# Patient Record
Sex: Female | Born: 1965 | Race: Black or African American | Hispanic: No | Smoking: Former smoker
Health system: Southern US, Community
[De-identification: ages and names within clinical notes are randomized; demographics above are authoritative.]

## PROBLEM LIST (undated history)

## (undated) DIAGNOSIS — C50912 Malignant neoplasm of unspecified site of left female breast: Secondary | ICD-10-CM

## (undated) HISTORY — PX: ABLATION: SHX5711

## (undated) HISTORY — DX: Malignant neoplasm of unspecified site of left female breast: C50.912

## (undated) HISTORY — PX: TUBAL LIGATION: SHX77

## (undated) MED FILL — Trastuzumab-anns For IV Soln 150 MG: INTRAVENOUS | Qty: 29 | Status: AC

---

## 2014-05-01 DIAGNOSIS — Z0000 Encounter for general adult medical examination without abnormal findings: Secondary | ICD-10-CM | POA: Insufficient documentation

## 2014-05-01 DIAGNOSIS — E0590 Thyrotoxicosis, unspecified without thyrotoxic crisis or storm: Secondary | ICD-10-CM | POA: Insufficient documentation

## 2015-10-17 DIAGNOSIS — M545 Low back pain, unspecified: Secondary | ICD-10-CM | POA: Insufficient documentation

## 2015-11-14 DIAGNOSIS — M5430 Sciatica, unspecified side: Secondary | ICD-10-CM | POA: Insufficient documentation

## 2016-10-18 DIAGNOSIS — R079 Chest pain, unspecified: Secondary | ICD-10-CM

## 2016-10-18 DIAGNOSIS — I10 Essential (primary) hypertension: Secondary | ICD-10-CM

## 2016-10-18 HISTORY — DX: Chest pain, unspecified: R07.9

## 2016-10-18 HISTORY — DX: Essential (primary) hypertension: I10

## 2016-10-19 DIAGNOSIS — F17200 Nicotine dependence, unspecified, uncomplicated: Secondary | ICD-10-CM

## 2016-10-19 HISTORY — DX: Nicotine dependence, unspecified, uncomplicated: F17.200

## 2019-06-12 DIAGNOSIS — I361 Nonrheumatic tricuspid (valve) insufficiency: Secondary | ICD-10-CM | POA: Diagnosis not present

## 2019-06-14 ENCOUNTER — Telehealth: Payer: Self-pay

## 2019-06-14 NOTE — Telephone Encounter (Signed)
NOTES ON FILE FROM [place], SENT REFERRAL TO SCHEDULING

## 2019-06-25 HISTORY — DX: Morbid (severe) obesity due to excess calories: E66.01

## 2019-06-26 ENCOUNTER — Ambulatory Visit (INDEPENDENT_AMBULATORY_CARE_PROVIDER_SITE_OTHER): Payer: BC Managed Care – PPO | Admitting: Cardiology

## 2019-06-26 ENCOUNTER — Encounter: Payer: Self-pay | Admitting: Cardiology

## 2019-06-26 ENCOUNTER — Other Ambulatory Visit: Payer: Self-pay

## 2019-06-26 VITALS — BP 134/86 | HR 71 | Ht 63.0 in | Wt 221.0 lb

## 2019-06-26 DIAGNOSIS — F17200 Nicotine dependence, unspecified, uncomplicated: Secondary | ICD-10-CM

## 2019-06-26 DIAGNOSIS — I429 Cardiomyopathy, unspecified: Secondary | ICD-10-CM

## 2019-06-26 DIAGNOSIS — R079 Chest pain, unspecified: Secondary | ICD-10-CM | POA: Diagnosis not present

## 2019-06-26 DIAGNOSIS — I10 Essential (primary) hypertension: Secondary | ICD-10-CM | POA: Diagnosis not present

## 2019-06-26 DIAGNOSIS — I428 Other cardiomyopathies: Secondary | ICD-10-CM

## 2019-06-26 HISTORY — DX: Cardiomyopathy, unspecified: I42.9

## 2019-06-26 MED ORDER — SACUBITRIL-VALSARTAN 24-26 MG PO TABS: 1.0000 | ORAL_TABLET | Freq: Two times a day (BID) | ORAL | 3 refills | Status: DC

## 2019-06-26 NOTE — Progress Notes (Signed)
Cardiology Office Note:    Date:  06/26/2019   ID:  Brandi Weaver, DOB June 07, 1965, MRN [id number]  PCP:  Wyline Copas Hewitt Shorts, PA-C  Cardiologist:  Jenean Lindau, MD   Referring MD: Derwood Kaplan, MD    ASSESSMENT:    1. Chest pain on exertion   2. Essential hypertension   3. Morbid (severe) obesity due to excess calories ([place])   4. Tobacco use disorder   5. Other cardiomyopathy ([place])    PLAN:    In order of problems listed above:  1. Primary prevention stressed with the patient.  Importance of compliance with diet and medication stressed and she vocalized understanding. 2. Cardiomyopathy: This is a new finding.  This was confirmed by [name] nuclear testing also.  At this time I will initiate her on Entresto.  We will do a Chem-7 today.  She has history of hypothyroidism and I would like to get her copy of her lab work including TSH to see if she is euthyroid.  It is possible that some of this may have contributed to her cardiomyopathy. 3. She has some chest tightness and risk factors also for coronary artery disease so we will do a [name]. 4. Cigarette smoker: She quit a couple of days ago and smoking was counseled.  I spent 5 minutes with the patient discussing solely about smoking. Smoking cessation was counseled. I suggested to the patient also different medications and pharmacological interventions. Patient is keen to try stopping on its own at this time. He will get back to me if he needs any further assistance in this matter. 5. Obesity: Weight reduction was stressed risks of obesity explained and she vocalized understanding 6. Breast cancer on chemotherapy: Followed by Dr. Hinton Rao.  Further recommendations will be made based on the findings of the a forementioned test and the patient will be seen in follow-up appointment in 3 to 4 weeks from now.   Medication Adjustments/Labs and Tests Ordered: Current medicines are reviewed at length with the patient  today.  Concerns regarding medicines are outlined above.  No orders of the defined types were placed in this encounter.  No orders of the defined types were placed in this encounter.    History of Present Illness:    Brandi Weaver is a 54 y.o. female who is being seen today for the evaluation of cardiomyopathy at the request of Derwood Kaplan, MD.  Patient has recently been diagnosed to have breast cancer and receiving chemotherapy.  She was found to have an ejection fraction which was mildly depressed by echocardiogram.  She gives history of heart attack in the remote past.  She tells me that this was stress related.  No details are available.  She underwent coronary angiography according to her history and she was told that her arteries were clean.  Subsequently she has not had any follow-up or echocardiogram sweats heart disease but there is finding of a mildly reduced ejection fraction is new or whether it has been swelling in the past.  She also has history of significant I spent 5 minutes with the patient discussing solely about smoking. Smoking cessation was counseled. I suggested to the patient also different medications and pharmacological interventions. Patient is keen to try stopping on its own at this time. He will get back to me if he needs any further assistance in this matter..  At the time of my evaluation, the patient is alert awake oriented and in no distress.  Past Medical History:  Diagnosis Date  . Chest pain on exertion 10/18/2016  . Hypertension 10/18/2016  . Malignant neoplasm of left breast ([place])   . Morbid (severe) obesity due to excess calories ([place]) 06/25/2019   Formatting of this note might be different from the original. per last BMI of 41.64 on 02/13/2018  . Tobacco use disorder 10/19/2016    Past Surgical History:  Procedure Laterality Date  . ABLATION    . TUBAL LIGATION      Current Medications: Current Meds  Medication Sig  . dexamethasone  (DECADRON) 4 MG tablet Take 4 mg by mouth as directed.  Marland Kitchen HYDROcodone-acetaminophen (NORCO/VICODIN) 5-325 MG tablet Take 1 tablet by mouth every 4 (four) hours.  Marland Kitchen loratadine (CLARITIN) 10 MG tablet Take 10 mg by mouth daily.  . methimazole (TAPAZOLE) 10 MG tablet Take 10 mg by mouth 3 (three) times daily.  Marland Kitchen nystatin (MYCOSTATIN/NYSTOP) powder Apply 1 application topically 2 (two) times daily.  . ondansetron (ZOFRAN) 4 MG tablet Take 4 mg by mouth every 4 (four) hours as needed.  . prochlorperazine (COMPAZINE) 10 MG tablet Take 10 mg by mouth every 6 (six) hours as needed.  . varenicline (CHANTIX) 1 MG tablet Take 1 mg by mouth 2 (two) times daily.  . [DISCONTINUED] atenolol (TENORMIN) 25 MG tablet Take 25 mg by mouth daily.     Allergies:   Sulfa antibiotics   Social History   Socioeconomic History  . Marital status: Widowed    Spouse name: Not on file  . Number of children: Not on file  . Years of education: Not on file  . Highest education level: Not on file  Occupational History  . Not on file  Tobacco Use  . Smoking status: Former Smoker    Types: Cigarettes    Quit date: 06/05/2019    Years since quitting: 0.0  . Smokeless tobacco: Never Used  Substance and Sexual Activity  . Alcohol use: Not Currently  . Drug use: Never  . Sexual activity: Not on file  Other Topics Concern  . Not on file  Social History Narrative  . Not on file   Social Determinants of Health   Financial Resource Strain:   . Difficulty of Paying Living Expenses:   Food Insecurity:   . Worried About Charity fundraiser in the Last Year:   . Arboriculturist in the Last Year:   Transportation Needs:   . Film/video editor (Medical):   Marland Kitchen Lack of Transportation (Non-Medical):   Physical Activity:   . Days of Exercise per Week:   . Minutes of Exercise per Session:   Stress:   . Feeling of Stress :   Social Connections:   . Frequency of Communication with Friends and Family:   . Frequency of  Social Gatherings with Friends and Family:   . Attends Religious Services:   . Active Member of Clubs or Organizations:   . Attends Archivist Meetings:   Marland Kitchen Marital Status:      Family History: The patient's family history includes Heart disease in her father; Hypertension in her father; Stroke in her father; Throat cancer in her mother.  ROS:   Please see the history of present illness.    All other systems reviewed and are negative.  EKGs/Labs/Other Studies Reviewed:    The following studies were reviewed today: EKG reveals sinus rhythm and nonspecific ST-T changes   Recent Labs: No results found for requested labs within  last 8760 hours.  Recent Lipid Panel No results found for: CHOL, TRIG, HDL, CHOLHDL, VLDL, LDLCALC, LDLDIRECT  Physical Exam:    VS:  BP 134/86   Pulse 71   Ht 5\' 3"  (1.6 m)   Wt 221 lb (100.2 kg)   SpO2 98%   BMI 39.15 kg/m     Wt Readings from Last 3 Encounters:  06/26/19 221 lb (100.2 kg)     GEN: Patient is in no acute distress HEENT: Normal NECK: No JVD; No carotid bruits LYMPHATICS: No lymphadenopathy CARDIAC: S1 S2 regular, 2/6 systolic murmur at the apex. RESPIRATORY:  Clear to auscultation without rales, wheezing or rhonchi  ABDOMEN: Soft, non-tender, non-distended MUSCULOSKELETAL:  No edema; No deformity  SKIN: Warm and dry NEUROLOGIC:  Alert and oriented x 3 PSYCHIATRIC:  Normal affect    Signed, Jenean Lindau, MD  06/26/2019 2:10 PM    [facility]

## 2019-06-26 NOTE — Patient Instructions (Addendum)
Medication Instructions:  Your physician has recommended you make the following change in your medication:   Stop Atenolol. Start Entresto 24-26 mg twice daily.  *If you need a refill on your cardiac medications before your next appointment, please call your pharmacy*   Lab Work: Your physician recommends that you had a BMET and TSH drawn today.  If you have labs (blood work) drawn today and your tests are completely normal, you will receive your results only by: Marland Kitchen MyChart Message (if you have MyChart) OR . A paper copy in the mail If you have any lab test that is abnormal or we need to change your treatment, we will call you to review the results.   Testing/Procedures: Your physician has requested that you have a lexiscan myoview. For further information please visit [email or web address]. Please follow instruction sheet, as given.  The test will take approximately 3 to 4 hours to complete; you may bring reading material.  If someone comes with you to your appointment, they will need to remain in the main lobby due to limited space in the testing area. **If you are pregnant or breastfeeding, please notify the nuclear lab prior to your appointment**  How to prepare for your Myocardial Perfusion Test: . Do not eat or drink 3 hours prior to your test, except you may have water. . Do not consume products containing caffeine (regular or decaffeinated) 12 hours prior to your test. (ex: coffee, chocolate, sodas, tea). . Do bring a list of your current medications with you.  If not listed below, you may take your medications as normal. . Do wear comfortable clothes (no dresses or overalls) and walking shoes, tennis shoes preferred (No heels or open toe shoes are allowed). . Do NOT wear cologne, perfume, aftershave, or lotions (deodorant is allowed). . If these instructions are not followed, your test will have to be rescheduled.    Follow-Up: At [facility], you and your health needs are  our priority.  As part of our continuing mission to provide you with exceptional heart care, we have created designated Provider Care Teams.  These Care Teams include your primary Cardiologist (physician) and Advanced Practice Providers (APPs -  Physician Assistants and Nurse Practitioners) who all work together to provide you with the care you need, when you need it.  We recommend signing up for the patient portal called "MyChart".  Sign up information is provided on this After Visit Summary.  MyChart is used to connect with patients for Virtual Visits (Telemedicine).  Patients are able to view lab/test results, encounter notes, upcoming appointments, etc.  Non-urgent messages can be sent to your provider as well.   To learn more about what you can do with MyChart, go to [email or web address].    Your next appointment:   4 week(s)  The format for your next appointment:   In Person  Provider:   Jyl Heinz, MD   Other Instructions NA

## 2019-06-27 ENCOUNTER — Telehealth: Payer: Self-pay

## 2019-06-27 LAB — BASIC METABOLIC PANEL
BUN/Creatinine Ratio: 22 (ref 9–23)
BUN: 20 mg/dL (ref 6–24)
CO2: 21 mmol/L (ref 20–29)
Calcium: 9.8 mg/dL (ref 8.7–10.2)
Chloride: 98 mmol/L (ref 96–106)
Creatinine, Ser: 0.9 mg/dL (ref 0.57–1.00)
GFR calc Af Amer: 83 mL/min/{1.73_m2} (ref >59)
GFR calc non Af Amer: 72 mL/min/{1.73_m2} (ref >59)
Glucose: 82 mg/dL (ref 65–99)
Potassium: 4.3 mmol/L (ref 3.5–5.2)
Sodium: 136 mmol/L (ref 134–144)

## 2019-06-27 LAB — TSH: TSH: 1.83 u[IU]/mL (ref 0.450–4.500)

## 2019-06-27 NOTE — Telephone Encounter (Signed)
Spoke with patient regarding results and recommendation.  Patient verbalizes understanding and is agreeable to plan of care. Advised patient to call back with any issues or concerns.  

## 2019-06-27 NOTE — Telephone Encounter (Signed)
-----   Message from Jenean Lindau, MD sent at 06/27/2019  8:07 AM EDT ----- The results of the study is unremarkable. Please inform patient. I will discuss in detail at next appointment. Cc  primary care/referring physician Jenean Lindau, MD 06/27/2019 8:07 AM

## 2019-07-03 ENCOUNTER — Encounter: Payer: Self-pay | Admitting: *Deleted

## 2019-07-03 ENCOUNTER — Telehealth: Payer: Self-pay | Admitting: *Deleted

## 2019-07-03 NOTE — Telephone Encounter (Signed)
Patient given detailed instructions per Myocardial Perfusion Study Information Sheet for the test on 07/10/2019 at 0815. Patient notified to arrive 15 minutes early and that it is imperative to arrive on time for appointment to keep from having the test rescheduled.  If you need to cancel or reschedule your appointment, please call the office within 24 hours of your appointment. . Patient verbalized understanding.[facility] Mychart letter sent with instructions

## 2019-07-10 ENCOUNTER — Other Ambulatory Visit: Payer: Self-pay

## 2019-07-10 ENCOUNTER — Ambulatory Visit (INDEPENDENT_AMBULATORY_CARE_PROVIDER_SITE_OTHER): Payer: BC Managed Care – PPO

## 2019-07-10 VITALS — Ht 63.0 in | Wt 221.0 lb

## 2019-07-10 DIAGNOSIS — R079 Chest pain, unspecified: Secondary | ICD-10-CM

## 2019-07-10 DIAGNOSIS — I428 Other cardiomyopathies: Secondary | ICD-10-CM | POA: Diagnosis not present

## 2019-07-10 LAB — MYOCARDIAL PERFUSION IMAGING
LV dias vol: 134 mL (ref 46–106)
LV sys vol: 82 mL
Peak HR: 93 {beats}/min
Rest HR: 64 {beats}/min
SDS: 2
SRS: 4
SSS: 6
TID: 1.03

## 2019-07-10 MED ORDER — REGADENOSON 0.4 MG/5ML IV SOLN
0.4000 mg | Freq: Once | INTRAVENOUS | Status: AC
  Administered 2019-07-10: 0.4 mg via INTRAVENOUS

## 2019-07-10 MED ORDER — TECHNETIUM TC 99M TETROFOSMIN IV KIT
10.1000 | PACK | Freq: Once | INTRAVENOUS | Status: AC | PRN
Start: 2019-07-10 — End: 2019-07-10
  Administered 2019-07-10: 10.1 via INTRAVENOUS

## 2019-07-10 MED ORDER — TECHNETIUM TC 99M TETROFOSMIN IV KIT
29.9000 | PACK | Freq: Once | INTRAVENOUS | Status: AC | PRN
  Administered 2019-07-10: 29.9 via INTRAVENOUS

## 2019-07-17 ENCOUNTER — Other Ambulatory Visit: Payer: Self-pay

## 2019-07-17 ENCOUNTER — Encounter: Payer: Self-pay | Admitting: Cardiology

## 2019-07-17 ENCOUNTER — Ambulatory Visit (INDEPENDENT_AMBULATORY_CARE_PROVIDER_SITE_OTHER): Payer: BC Managed Care – PPO | Admitting: Cardiology

## 2019-07-17 VITALS — BP 118/78 | HR 108 | Ht 63.0 in | Wt 224.0 lb

## 2019-07-17 DIAGNOSIS — I428 Other cardiomyopathies: Secondary | ICD-10-CM

## 2019-07-17 DIAGNOSIS — R9439 Abnormal result of other cardiovascular function study: Secondary | ICD-10-CM

## 2019-07-17 DIAGNOSIS — I10 Essential (primary) hypertension: Secondary | ICD-10-CM | POA: Diagnosis not present

## 2019-07-17 DIAGNOSIS — F17200 Nicotine dependence, unspecified, uncomplicated: Secondary | ICD-10-CM

## 2019-07-17 MED ORDER — METOPROLOL TARTRATE 100 MG PO TABS
100.0000 mg | ORAL_TABLET | Freq: Once | ORAL | 0 refills | Status: DC
Start: 2019-07-17 — End: 2019-09-06

## 2019-07-17 NOTE — Progress Notes (Signed)
Cardiology Office Note:    Date:  07/17/2019   ID:  Brandi Weaver, DOB 1965/12/02, MRN [id number]  PCP:  Wyline Copas Hewitt Shorts, PA-C  Cardiologist:  Jenean Lindau, MD   Referring MD: Wyline Copas Hewitt Shorts, PA-C    ASSESSMENT:    No diagnosis found. PLAN:    In order of problems listed above:  1. Abnormal stress nuclear Cardiolite: I discussed my findings with the patient at extensive length.  She leads a sedentary life and has no chest pain however if she wants this evaluated further.  I discussed invasive and noninvasive evaluation and she prefers CT coronary angiography and I discussed this with her at length.  She is agreeable.  We will schedule her for this.  In the interim she will take a coated aspirin on a daily basis if okay with her cancer doctors. 2. Essential hypertension: Blood pressure is stable 3. Cigarette smoker: I counseled her to quit and she is promising to do better. 4. Patient will be seen in follow-up appointment in 3 months or earlier if the patient has any concerns 5. She knows to go to nearest emergency room for any concerning symptoms.   Medication Adjustments/Labs and Tests Ordered: Current medicines are reviewed at length with the patient today.  Concerns regarding medicines are outlined above.  No orders of the defined types were placed in this encounter.  No orders of the defined types were placed in this encounter.    Chief Complaint  Patient presents with  . Follow-up    3-4 WK FU      History of Present Illness:    Brandi Weaver is a 54 y.o. female.  Patient has past medical history of coronary artery disease.  Details are not clear.  She is receiving treatment for cancer.  No chest pain orthopnea or PND.  Her stress test is abnormal.  There is no evidence of ischemia but is wall motion abnormalities and reduced ejection fraction.  She is here for follow-up.  Unfortunately she is continuing to smoke.  Past Medical History:  Diagnosis Date   . Chest pain on exertion 10/18/2016  . Hypertension 10/18/2016  . Malignant neoplasm of left breast ([place])   . Morbid (severe) obesity due to excess calories ([place]) 06/25/2019   Formatting of this note might be different from the original. per last BMI of 41.64 on 02/13/2018  . Tobacco use disorder 10/19/2016    Past Surgical History:  Procedure Laterality Date  . ABLATION    . TUBAL LIGATION      Current Medications: Current Meds  Medication Sig  . amLODipine (NORVASC) 5 MG tablet Take 5 mg by mouth daily.  Marland Kitchen dexamethasone (DECADRON) 4 MG tablet Take 4 mg by mouth as directed.  Marland Kitchen HYDROcodone-acetaminophen (NORCO/VICODIN) 5-325 MG tablet Take 1 tablet by mouth every 4 (four) hours.  Marland Kitchen loratadine (CLARITIN) 10 MG tablet Take 10 mg by mouth daily.  . methimazole (TAPAZOLE) 10 MG tablet Take 10 mg by mouth 3 (three) times daily.  Marland Kitchen nystatin (MYCOSTATIN/NYSTOP) powder Apply 1 application topically 2 (two) times daily.  . ondansetron (ZOFRAN) 4 MG tablet Take 4 mg by mouth every 4 (four) hours as needed.  . prochlorperazine (COMPAZINE) 10 MG tablet Take 10 mg by mouth every 6 (six) hours as needed.  . sacubitril-valsartan (ENTRESTO) 24-26 MG Take 1 tablet by mouth 2 (two) times daily.  . varenicline (CHANTIX) 1 MG tablet Take 1 mg by mouth 2 (two) times daily.  Allergies:   Sulfa antibiotics   Social History   Socioeconomic History  . Marital status: Widowed    Spouse name: Not on file  . Number of children: Not on file  . Years of education: Not on file  . Highest education level: Not on file  Occupational History  . Not on file  Tobacco Use  . Smoking status: Former Smoker    Types: Cigarettes    Quit date: 06/05/2019    Years since quitting: 0.1  . Smokeless tobacco: Never Used  Substance and Sexual Activity  . Alcohol use: Not Currently  . Drug use: Never  . Sexual activity: Not on file  Other Topics Concern  . Not on file  Social History Narrative  . Not on file    Social Determinants of Health   Financial Resource Strain:   . Difficulty of Paying Living Expenses:   Food Insecurity:   . Worried About Charity fundraiser in the Last Year:   . Arboriculturist in the Last Year:   Transportation Needs:   . Film/video editor (Medical):   Marland Kitchen Lack of Transportation (Non-Medical):   Physical Activity:   . Days of Exercise per Week:   . Minutes of Exercise per Session:   Stress:   . Feeling of Stress :   Social Connections:   . Frequency of Communication with Friends and Family:   . Frequency of Social Gatherings with Friends and Family:   . Attends Religious Services:   . Active Member of Clubs or Organizations:   . Attends Archivist Meetings:   Marland Kitchen Marital Status:      Family History: The patient's family history includes Heart disease in her father; Hypertension in her father; Stroke in her father; Throat cancer in her mother.  ROS:   Please see the history of present illness.    All other systems reviewed and are negative.  EKGs/Labs/Other Studies Reviewed:    The following studies were reviewed today: Study Highlights   The left ventricular ejection fraction is moderately decreased (30-44%).  Nuclear stress EF: 39%.  There was no ST segment deviation noted during stress.  Defect 1: There is a medium defect of moderate severity.  Findings consistent with prior myocardial infarction.  This is an intermediate risk study.  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  Diminished EF.        Recent Labs: 06/26/2019: BUN 20; Creatinine, Ser 0.90; Potassium 4.3; Sodium 136; TSH 1.830  Recent Lipid Panel No results found for: CHOL, TRIG, HDL, CHOLHDL, VLDL, LDLCALC, LDLDIRECT  Physical Exam:    VS:  BP 118/78   Pulse (!) 108   Ht 5\' 3"  (1.6 m)   Wt 224 lb (101.6 kg)   SpO2 96%   BMI 39.68 kg/m     Wt Readings from Last 3 Encounters:  07/17/19 224 lb (101.6 kg)  07/10/19 221 lb (100.2 kg)  06/26/19 221 lb  (100.2 kg)     GEN: Patient is in no acute distress HEENT: Normal NECK: No JVD; No carotid bruits LYMPHATICS: No lymphadenopathy CARDIAC: Hear sounds regular, 2/6 systolic murmur at the apex. RESPIRATORY:  Clear to auscultation without rales, wheezing or rhonchi  ABDOMEN: Soft, non-tender, non-distended MUSCULOSKELETAL:  No edema; No deformity  SKIN: Warm and dry NEUROLOGIC:  Alert and oriented x 3 PSYCHIATRIC:  Normal affect   Signed, Jenean Lindau, MD  07/17/2019 3:44 PM    [facility]

## 2019-07-17 NOTE — Patient Instructions (Signed)
Medication Instructions:  Your physician has recommended you make the following change in your medication:   Start taking 81 mg coated baby aspirin daily.  *If you need a refill on your cardiac medications before your next appointment, please call your pharmacy*   Lab Work: Your physician recommends that you return for lab work in: if your CT is after 07/25/19 you need to have a BMET done.   If you have labs (blood work) drawn today and your tests are completely normal, you will receive your results only by: Marland Kitchen MyChart Message (if you have MyChart) OR . A paper copy in the mail If you have any lab test that is abnormal or we need to change your treatment, we will call you to review the results.   Testing/Procedures: Your cardiac CT will be scheduled at:   [facility] [address] [place], [place] [address] [phone]  [facility], please arrive at the [facility] main entrance of [facility] 30 minutes prior to test start time. Proceed to the [facility] Radiology Department (first floor) to check-in and test prep.  Please follow these instructions carefully (unless otherwise directed):   On the Night Before the Test: . Be sure to Drink plenty of water. . Do not consume any caffeinated/decaffeinated beverages or chocolate 12 hours prior to your test. . Do not take any antihistamines 12 hours prior to your test.  On the Day of the Test: . Drink plenty of water. Do not drink any water within one hour of the test. . Do not eat any food 4 hours prior to the test. . You may take your regular medications prior to the test.  . Take metoprolol (Lopressor) two hours prior to test. . FEMALES- please wear underwire-free bra if available       After the Test: . Drink plenty of water. . After receiving IV contrast, you may experience a mild flushed feeling. This is normal. . On occasion, you may experience a mild rash up to 24 hours after the test.  This is not dangerous. If this occurs, you can take Benadryl 25 mg and increase your fluid intake. . If you experience trouble breathing, this can be serious. If it is severe call 911 IMMEDIATELY. If it is mild, please call our office. . If you take any of these medications: Glipizide/Metformin, Avandament, Glucavance, please do not take 48 hours after completing test unless otherwise instructed.   Once we have confirmed authorization from your insurance company, we will call you to set up a date and time for your test.   For non-scheduling related questions, please contact the cardiac imaging nurse navigator should you have any questions/concerns: Marchia Bond, Cardiac Imaging Nurse Navigator Burley Saver, Interim Cardiac Imaging Nurse [place] [facility] Direct Office Dial: [phone]   For scheduling needs, including cancellations and rescheduling, please call [phone].     Follow-Up: At [facility], you and your health needs are our priority.  As part of our continuing mission to provide you with exceptional heart care, we have created designated Provider Care Teams.  These Care Teams include your primary Cardiologist (physician) and Advanced Practice Providers (APPs -  Physician Assistants and Nurse Practitioners) who all work together to provide you with the care you need, when you need it.  We recommend signing up for the patient portal called "MyChart".  Sign up information is provided on this After Visit Summary.  MyChart is used to  connect with patients for Virtual Visits (Telemedicine).  Patients are able to view lab/test results, encounter notes, upcoming appointments, etc.  Non-urgent messages can be sent to your provider as well.   To learn more about what you can do with MyChart, go to [email or web address].    Your next appointment:   3 month(s)  The format for your next appointment:   In Person  Provider:   Jyl Heinz,  MD   Other Instructions  Cardiac CT Angiogram A cardiac CT angiogram is a procedure to look at the heart and the area around the heart. It may be done to help find the cause of chest pains or other symptoms of heart disease. During this procedure, a substance called contrast dye is injected into the blood vessels in the area to be checked. A large X-ray machine, called a CT scanner, then takes detailed pictures of the heart and the surrounding area. The procedure is also sometimes called a coronary CT angiogram, coronary artery scanning, or CTA. A cardiac CT angiogram allows the health care provider to see how well blood is flowing to and from the heart. The health care provider will be able to see if there are any problems, such as:  Blockage or narrowing of the coronary arteries in the heart.  Fluid around the heart.  Signs of weakness or disease in the muscles, valves, and tissues of the heart. Tell a health care provider about:  Any allergies you have. This is especially important if you have had a previous allergic reaction to contrast dye.  All medicines you are taking, including vitamins, herbs, eye drops, creams, and over-the-counter medicines.  Any blood disorders you have.  Any surgeries you have had.  Any medical conditions you have.  Whether you are pregnant or may be pregnant.  Any anxiety disorders, chronic pain, or other conditions you have that may increase your stress or prevent you from lying still. What are the risks? Generally, this is a safe procedure. However, problems may occur, including:  Bleeding.  Infection.  Allergic reactions to medicines or dyes.  Damage to other structures or organs.  Kidney damage from the contrast dye that is used.  Increased risk of cancer from radiation exposure. This risk is low. Talk with your health care provider about: ? The risks and benefits of testing. ? How you can receive the lowest dose of radiation. What  happens before the procedure?  Wear comfortable clothing and remove any jewelry, glasses, dentures, and hearing aids.  Follow instructions from your health care provider about eating and drinking. This may include: ? For 12 hours before the procedure -- avoid caffeine. This includes tea, coffee, soda, energy drinks, and diet pills. Drink plenty of water or other fluids that do not have caffeine in them. Being well hydrated can prevent complications. ? For 4-6 hours before the procedure -- stop eating and drinking. The contrast dye can cause nausea, but this is less likely if your stomach is empty.  Ask your health care provider about changing or stopping your regular medicines. This is especially important if you are taking diabetes medicines, blood thinners, or medicines to treat problems with erections (erectile dysfunction). What happens during the procedure?   Hair on your chest may need to be removed so that small sticky patches called electrodes can be placed on your chest. These will transmit information that helps to monitor your heart during the procedure.  An IV will be inserted into one of your veins.  You might be given a medicine to control your heart rate during the procedure. This will help to ensure that good images are obtained.  You will be asked to lie on an exam table. This table will slide in and out of the CT machine during the procedure.  Contrast dye will be injected into the IV. You might feel warm, or you may get a metallic taste in your mouth.  You will be given a medicine called nitroglycerin. This will relax or dilate the arteries in your heart.  The table that you are lying on will move into the CT machine tunnel for the scan.  The person running the machine will give you instructions while the scans are being done. You may be asked to: ? Keep your arms above your head. ? Hold your breath. ? Stay very still, even if the table is moving.  When the scanning  is complete, you will be moved out of the machine.  The IV will be removed. The procedure may vary among health care providers and hospitals. What can I expect after the procedure? After your procedure, it is common to have:  A metallic taste in your mouth from the contrast dye.  A feeling of warmth.  A headache from the nitroglycerin. Follow these instructions at home:  Take over-the-counter and prescription medicines only as told by your health care provider.  If you are told, drink enough fluid to keep your urine pale yellow. This will help to flush the contrast dye out of your body.  Most people can return to their normal activities right after the procedure. Ask your health care provider what activities are safe for you.  It is up to you to get the results of your procedure. Ask your health care provider, or the department that is doing the procedure, when your results will be ready.  Keep all follow-up visits as told by your health care provider. This is important. Contact a health care provider if:  You have any symptoms of allergy to the contrast dye. These include: ? Shortness of breath. ? Rash or hives. ? A racing heartbeat. Summary  A cardiac CT angiogram is a procedure to look at the heart and the area around the heart. It may be done to help find the cause of chest pains or other symptoms of heart disease.  During this procedure, a large X-ray machine, called a CT scanner, takes detailed pictures of the heart and the surrounding area after a contrast dye has been injected into blood vessels in the area.  Ask your health care provider about changing or stopping your regular medicines before the procedure. This is especially important if you are taking diabetes medicines, blood thinners, or medicines to treat erectile dysfunction.  If you are told, drink enough fluid to keep your urine pale yellow. This will help to flush the contrast dye out of your body. This  information is not intended to replace advice given to you by your health care provider. Make sure you discuss any questions you have with your health care provider. Document Revised: 09/20/2018 Document Reviewed: 09/20/2018 Elsevier Patient Education  [place].

## 2019-07-26 ENCOUNTER — Telehealth: Payer: Self-pay | Admitting: Cardiology

## 2019-07-26 NOTE — Telephone Encounter (Signed)
Spoke to the patient just now and let her know that her cardiac CT will be scheduled but that it normally takes about 4-8 weeks for this to be done. She verbalizes understanding.   She also requested that I send records of her last office visit note and her recent stress test results to [facility] as they said that they needed them prior to scheduling the patient for her next treatment. I faxed these notes to them at this time.   No other issues or concerns were noted.    Encouraged patient to call back with any questions or concerns.

## 2019-07-26 NOTE — Telephone Encounter (Signed)
Patient states she was supposed to be schedule for a CT scan at [facility]. She has not heard anything and states that her oncologist told her she cannot have her next chemo treatment until they have an EF correspondence from [name]. Please advise.

## 2019-08-15 ENCOUNTER — Telehealth (HOSPITAL_COMMUNITY): Payer: Self-pay | Admitting: *Deleted

## 2019-08-15 NOTE — Telephone Encounter (Signed)
Reaching out to patient to offer assistance regarding upcoming cardiac imaging study; pt verbalizes understanding of appt date/time, parking situation and where to check in, pre-test NPO status and medications ordered, and verified current allergies; name and call back number provided for further questions should they arise ° °Proctor Carriker Tai RN Navigator Cardiac Imaging °[facility] °[phone] office °[phone] cell ° °

## 2019-08-16 ENCOUNTER — Ambulatory Visit (HOSPITAL_COMMUNITY)
Admission: RE | Admit: 2019-08-16 | Discharge: 2019-08-16 | Disposition: A | Discharge status: Home / Self Care | Payer: BC Managed Care – PPO | Source: Ambulatory Visit | Attending: Cardiology | Admitting: Cardiology

## 2019-08-16 ENCOUNTER — Other Ambulatory Visit: Payer: Self-pay

## 2019-08-16 DIAGNOSIS — R9439 Abnormal result of other cardiovascular function study: Secondary | ICD-10-CM | POA: Insufficient documentation

## 2019-08-16 DIAGNOSIS — I2510 Atherosclerotic heart disease of native coronary artery without angina pectoris: Secondary | ICD-10-CM | POA: Diagnosis not present

## 2019-08-16 IMAGING — CT CT HEART MORP W/ CTA COR W/ SCORE W/ CA W/CM &/OR W/O CM
4 of 7 series · 8 of 20 positions shown, 9 images · IV contrast (APPLIED)
Comparison: None.
COMPARISON: None.

Addendum:
EXAM:
OVER-READ INTERPRETATION  CT CHEST

The following report is an over-read performed by radiologist Dr.
Khuram Diane [REDACTED] on 08/16/2019. This
over-read does not include interpretation of cardiac or coronary
anatomy or pathology. The coronary calcium score/coronary CTA
interpretation by the cardiologist is attached.
HISTORY: Cardiomyopathy, undefined, further testing abnormal lexiscan
Cardiac/Coronary  CT
TECHNIQUE: The patient was scanned on a Siemens Force scanner.
PROTOCOL: A 120 kV prospective scan was triggered in the descending thoracic
aorta at 111 HU's. Axial non-contrast 3 mm slices were carried out
through the heart. The data set was analyzed on a dedicated work
station and scored using the Agatson method. Gantry rotation speed
was 250 msecs and collimation was .6 mm. Beta blockade and 0.8 mg of
sl NTG was given. The 3D data set was reconstructed in 5% intervals
of the 67-82 % of the R-R cycle. Diastolic phases were analyzed on a
dedicated work station using MPR, MIP and VRT modes. The patient
received 80mL OMNIPAQUE IOHEXOL 350 MG/ML SOLN of contrast.

[Series 6: best diast 76 % · axial · 0.39mm/px · z∈[-187,-145]mm · 2 of 318 slices shown]
[im 106/318  vessel]
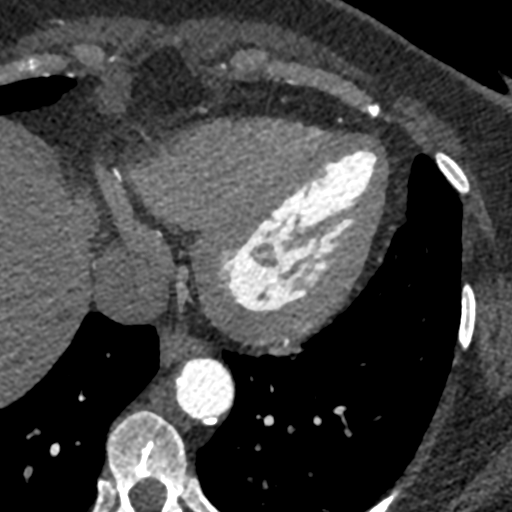
[im 212/318  vessel]
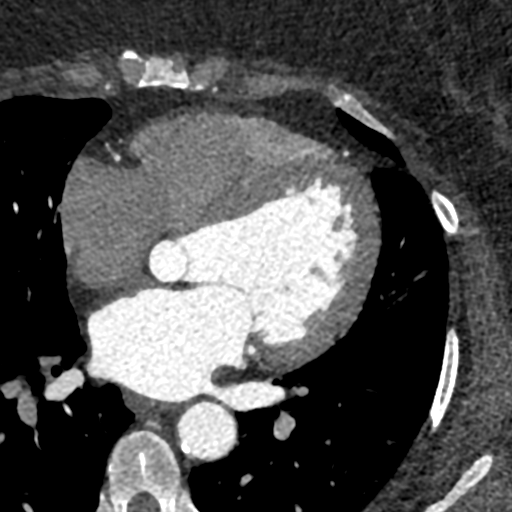

[Series 8: best syst · axial · 0.39mm/px · z∈[-187,-145]mm · 2 of 318 slices shown, 3 images]
[im 106/318  vessel]
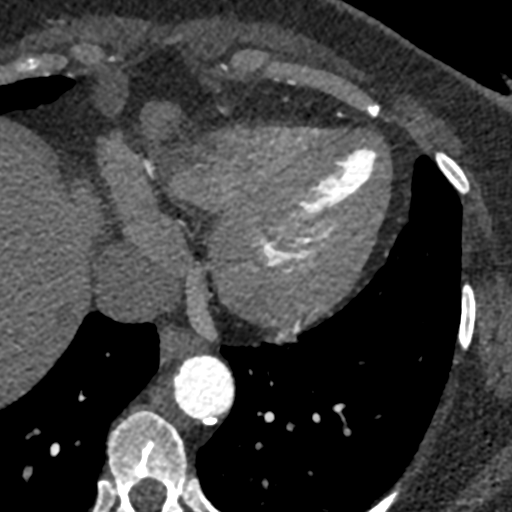
[im 106/318  lung]
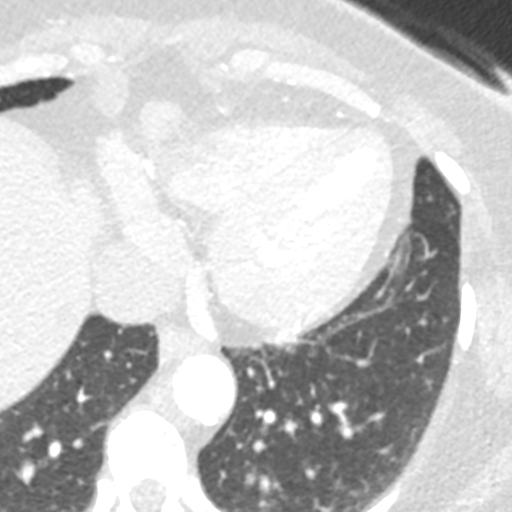
[im 212/318  vessel]
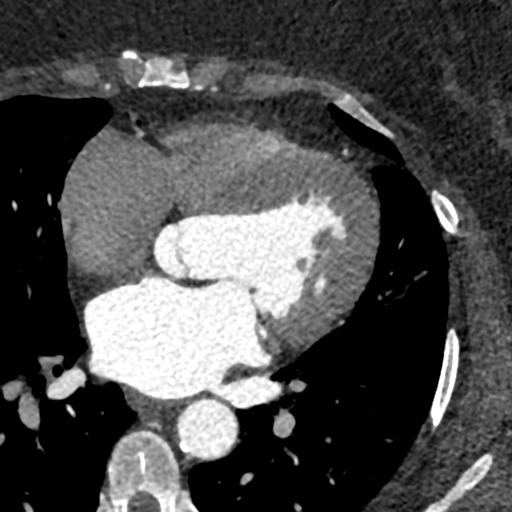

[Series 9: ts diast sharp · axial · 0.39mm/px · z∈[-187,-145]mm · 2 of 318 slices shown]
[im 106/318  lung]
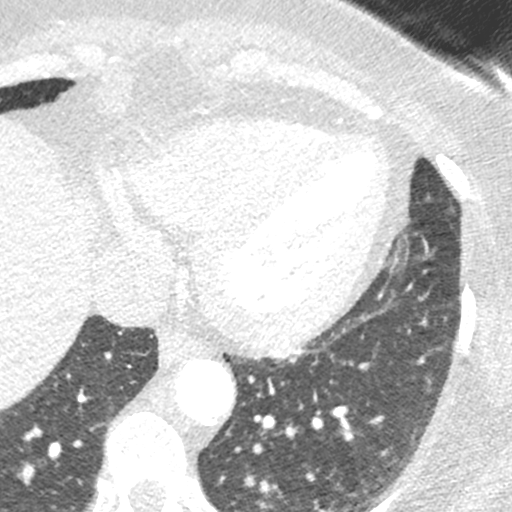
[im 212/318  lung]
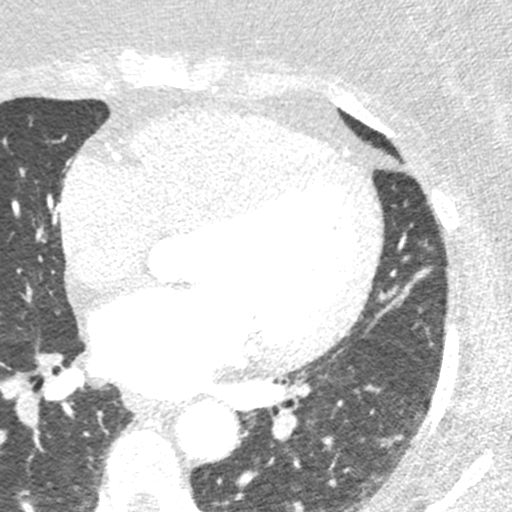

[Series 10: ts syst sharp · axial · 0.39mm/px · z∈[-187,-145]mm · 2 of 318 slices shown]
[im 106/318  lung]
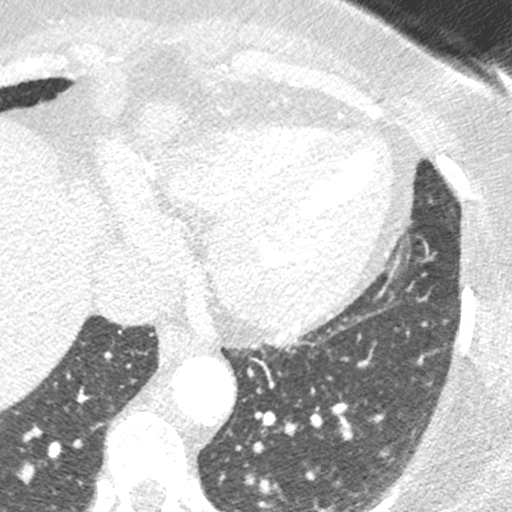
[im 212/318  lung]
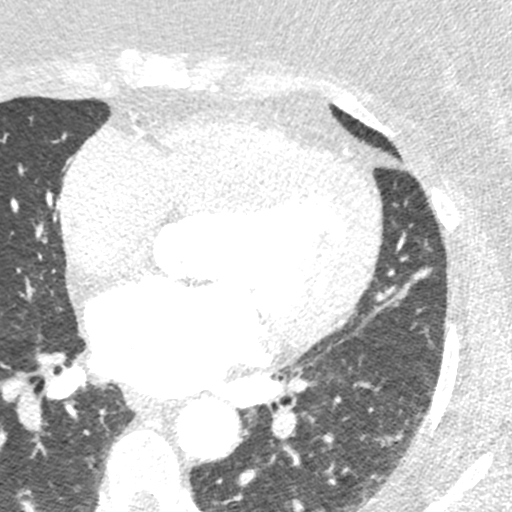

[8 of 20 positions shown; findings below may reference images not displayed]

FINDINGS: Tip of central venous catheter in the distal superior vena cava.
Aortic atherosclerosis. Within the visualized portions of the thorax
there are no suspicious appearing pulmonary nodules or masses, there
is no acute consolidative airspace disease, no pleural effusions, no
pneumothorax and no lymphadenopathy. Visualized portions of the
upper abdomen are unremarkable. There are no aggressive appearing
lytic or blastic lesions noted in the visualized portions of the
skeleton.
IMPRESSION: 1.  Aortic Atherosclerosis (9UHKY-WYJ.J).
FINDINGS: Coronary calcium score is 168, which places the patient in the 97
percentile for age and sex matched control.

Coronary arteries: Normal coronary origins.  Right dominance.

Right Coronary Artery: No detectable plaque or stenosis.

Left Main Coronary Artery: Mild mixed atherosclerotic plaque in the
mid left main coronary artery, 25-49% stenosis.

Left Anterior Descending Coronary Artery: Moderate mixed
atherosclerotic plaque in the proximal LAD, 50-69% stenosis, with a
serial mild mixed atherosclerotic plaque, 25-49% stenosis.

Left Circumflex Artery: Minimal atherosclerotic plaque in the
proximal left circumflex, <25% stenosis.

Aorta: Normal size, 29 mm at the mid ascending aorta (level of the
PA bifurcation) measured double oblique. Moderate calcifications in
descending thoracic . No dissection.

Aortic Valve: No calcifications.

Other findings:

Normal pulmonary vein drainage into the left atrium.

Normal left atrial appendage without a thrombus.

Mildly dilated main pulmonary artery, 30 mm.
IMPRESSION: 1. Moderate CAD in proximal LAD and mild CAD in mid left main
coronary artery, CADRADS = 3. CT FFR will be performed and reported
separately.

2. Coronary calcium score is 168, which places the patient in the 97
percentile for age and sex matched control.

3. Normal coronary origin with right dominance.

4. Mildly dilated main pulmonary artery, 30 mm.

*** End of Addendum ***
EXAM:
OVER-READ INTERPRETATION  CT CHEST

The following report is an over-read performed by radiologist Dr.
Khuram Diane [REDACTED] on 08/16/2019. This
over-read does not include interpretation of cardiac or coronary
anatomy or pathology. The coronary calcium score/coronary CTA
interpretation by the cardiologist is attached.
FINDINGS: Tip of central venous catheter in the distal superior vena cava.
Aortic atherosclerosis. Within the visualized portions of the thorax
there are no suspicious appearing pulmonary nodules or masses, there
is no acute consolidative airspace disease, no pleural effusions, no
pneumothorax and no lymphadenopathy. Visualized portions of the
upper abdomen are unremarkable. There are no aggressive appearing
lytic or blastic lesions noted in the visualized portions of the
skeleton.
IMPRESSION: 1.  Aortic Atherosclerosis (9UHKY-WYJ.J).

## 2019-08-16 MED ORDER — NITROGLYCERIN 0.4 MG SL SUBL
0.8000 mg | SUBLINGUAL_TABLET | Freq: Once | SUBLINGUAL | Status: AC
  Administered 2019-08-16: 0.8 mg via SUBLINGUAL

## 2019-08-16 MED ORDER — NITROGLYCERIN 0.4 MG SL SUBL
SUBLINGUAL_TABLET | SUBLINGUAL | Status: AC
  Filled 2019-08-16: qty 2

## 2019-08-16 MED ORDER — IOHEXOL 350 MG/ML SOLN
80.0000 mL | Freq: Once | INTRAVENOUS | Status: AC | PRN
  Administered 2019-08-16: 80 mL via INTRAVENOUS

## 2019-08-17 ENCOUNTER — Ambulatory Visit (HOSPITAL_COMMUNITY)
Admission: RE | Admit: 2019-08-17 | Discharge: 2019-08-17 | Disposition: A | Discharge status: Home / Self Care | Payer: BC Managed Care – PPO | Source: Ambulatory Visit | Attending: Cardiology | Admitting: Cardiology

## 2019-08-17 DIAGNOSIS — R9439 Abnormal result of other cardiovascular function study: Secondary | ICD-10-CM | POA: Insufficient documentation

## 2019-08-17 DIAGNOSIS — I2510 Atherosclerotic heart disease of native coronary artery without angina pectoris: Secondary | ICD-10-CM | POA: Diagnosis not present

## 2019-08-20 MED ORDER — NITROGLYCERIN 0.4 MG SL SUBL
0.4000 mg | SUBLINGUAL_TABLET | SUBLINGUAL | 6 refills | Status: DC | PRN
Start: 2019-08-20 — End: 2020-06-05

## 2019-08-20 NOTE — Addendum Note (Signed)
Addended by: Truddie Hidden on: 08/20/2019 04:41 PM   Modules accepted: Orders

## 2019-09-05 ENCOUNTER — Other Ambulatory Visit: Payer: Self-pay

## 2019-09-05 DIAGNOSIS — C50912 Malignant neoplasm of unspecified site of left female breast: Secondary | ICD-10-CM | POA: Insufficient documentation

## 2019-09-06 ENCOUNTER — Encounter: Payer: Self-pay | Admitting: Cardiology

## 2019-09-06 ENCOUNTER — Other Ambulatory Visit: Payer: Self-pay

## 2019-09-06 ENCOUNTER — Ambulatory Visit (INDEPENDENT_AMBULATORY_CARE_PROVIDER_SITE_OTHER): Payer: BC Managed Care – PPO | Admitting: Cardiology

## 2019-09-06 VITALS — BP 118/66 | HR 98 | Ht 63.0 in | Wt 221.0 lb

## 2019-09-06 DIAGNOSIS — F17200 Nicotine dependence, unspecified, uncomplicated: Secondary | ICD-10-CM | POA: Diagnosis not present

## 2019-09-06 DIAGNOSIS — I429 Cardiomyopathy, unspecified: Secondary | ICD-10-CM

## 2019-09-06 DIAGNOSIS — I2510 Atherosclerotic heart disease of native coronary artery without angina pectoris: Secondary | ICD-10-CM

## 2019-09-06 DIAGNOSIS — I10 Essential (primary) hypertension: Secondary | ICD-10-CM | POA: Diagnosis not present

## 2019-09-06 HISTORY — DX: Atherosclerotic heart disease of native coronary artery without angina pectoris: I25.10

## 2019-09-06 NOTE — Patient Instructions (Signed)
Medication Instructions:  Your physician recommends that you continue on your current medications as directed. Please refer to the Current Medication list given to you today.  *If you need a refill on your cardiac medications before your next appointment, please call your pharmacy*   Lab Work: Your physician recommends that you return for lab work in: TODAY BMP, LFT, Lipids If you have labs (blood work) drawn today and your tests are completely normal, you will receive your results only by: Marland Kitchen MyChart Message (if you have MyChart) OR . A paper copy in the mail If you have any lab test that is abnormal or we need to change your treatment, we will call you to review the results.   Testing/Procedures: None   Follow-Up: At [facility], you and your health needs are our priority.  As part of our continuing mission to provide you with exceptional heart care, we have created designated Provider Care Teams.  These Care Teams include your primary Cardiologist (physician) and Advanced Practice Providers (APPs -  Physician Assistants and Nurse Practitioners) who all work together to provide you with the care you need, when you need it.  We recommend signing up for the patient portal called "MyChart".  Sign up information is provided on this After Visit Summary.  MyChart is used to connect with patients for Virtual Visits (Telemedicine).  Patients are able to view lab/test results, encounter notes, upcoming appointments, etc.  Non-urgent messages can be sent to your provider as well.   To learn more about what you can do with MyChart, go to [email or web address].    Your next appointment:   3 month(s)  The format for your next appointment:   In Person  Provider:   [name]   Other Instructions

## 2019-09-06 NOTE — Progress Notes (Signed)
Cardiology Office Note:    Date:  09/06/2019   ID:  Brandi Weaver, DOB 1965/07/27, MRN [id number]  PCP:  Wyline Copas Hewitt Shorts, PA-C  Cardiologist:  Jenean Lindau, MD   Referring MD: Wyline Copas Hewitt Shorts, PA-C    ASSESSMENT:    1. Cardiomyopathy, unspecified type ([place])   2. Essential hypertension   3. Tobacco use disorder   4. Morbid (severe) obesity due to excess calories ([place])   5. Coronary artery disease involving native coronary artery of native heart without angina pectoris    PLAN:    In order of problems listed above:  1. Coronary artery disease: Secondary prevention stressed with the patient.  Importance of compliance with diet medication stressed and she vocalized understanding.  Coronary angiography report was discussed with her and questions were answered to her satisfaction.  I will get lipid blood work on her today and make sure that we can have her on statins.  I discussed this with her and she is agreeable.  Also she is on multiple medications such as Entresto and therefore a Chem-7 will be done 2. Essential hypertension: Blood pressure stable and diet was emphasized 3. Lipids screening: Will be done today as mentioned above 4. Morbid obesity: Weight reduction was stressed and she promises to do better.  Diet was emphasized 5. Cigarette smoking: She quit since the last visit.  She promises never to go back and I congratulated her about this. 6. Patient will be seen in follow-up appointment in 3 months or earlier if the patient has any concerns 7. Cardiomyopathy: We will review echocardiogram and follow-up appointment in 3 months.   Medication Adjustments/Labs and Tests Ordered: Current medicines are reviewed at length with the patient today.  Concerns regarding medicines are outlined above.  No orders of the defined types were placed in this encounter.  No orders of the defined types were placed in this encounter.    No chief complaint on file.    History of  Present Illness:    Brandi Weaver is a 54 y.o. female.  Patient has past medical history of coronary artery disease, essential hypertension.  She has breast cancer but she tells me only 2 more treatments at left and she has responded well to treatment.  No chest pain orthopnea or PND.  At the time of my evaluation, the patient is alert awake oriented and in no distress.  CT coronary angiography with FFR has revealed significant disease but medical management was recommended.  Fortunately patient mentions to me that she has quit smoking since the last time she saw me.  Past Medical History:  Diagnosis Date  . Cardiomyopathy ([place]) 06/26/2019  . Chest pain on exertion 10/18/2016  . Hypertension 10/18/2016  . Malignant neoplasm of left breast ([place])   . Morbid (severe) obesity due to excess calories ([place]) 06/25/2019   Formatting of this note might be different from the original. per last BMI of 41.64 on 02/13/2018  . Tobacco use disorder 10/19/2016    Past Surgical History:  Procedure Laterality Date  . ABLATION    . TUBAL LIGATION      Current Medications: Current Meds  Medication Sig  . amLODipine (NORVASC) 5 MG tablet Take 5 mg by mouth daily.  Marland Kitchen aspirin EC 81 MG tablet Take 81 mg by mouth daily. Swallow whole.  Marland Kitchen dexamethasone (DECADRON) 4 MG tablet Take 4 mg by mouth as directed.  Marland Kitchen HYDROcodone-acetaminophen (NORCO/VICODIN) 5-325 MG tablet Take 1 tablet by mouth every  4 (four) hours.  Marland Kitchen loratadine (CLARITIN) 10 MG tablet Take 10 mg by mouth daily.  . methimazole (TAPAZOLE) 10 MG tablet Take 30 mg by mouth daily.  . nitroGLYCERIN (NITROSTAT) 0.4 MG SL tablet Place 1 tablet (0.4 mg total) under the tongue every 5 (five) minutes as needed.  . nystatin (MYCOSTATIN/NYSTOP) powder Apply 1 application topically 2 (two) times daily.  . ondansetron (ZOFRAN) 4 MG tablet Take 4 mg by mouth every 4 (four) hours as needed.  . Oxycodone HCl 10 MG TABS Take 10 mg by mouth every 6 (six) hours as  needed.  . prochlorperazine (COMPAZINE) 10 MG tablet Take 10 mg by mouth every 6 (six) hours as needed.  . sacubitril-valsartan (ENTRESTO) 24-26 MG Take 1 tablet by mouth 2 (two) times daily.  . traMADol (ULTRAM) 50 MG tablet Take 1 tablet by mouth every 6 (six) hours as needed.     Allergies:   Sulfa antibiotics   Social History   Socioeconomic History  . Marital status: Widowed    Spouse name: Not on file  . Number of children: Not on file  . Years of education: Not on file  . Highest education level: Not on file  Occupational History  . Not on file  Tobacco Use  . Smoking status: Former Smoker    Types: Cigarettes    Quit date: 06/05/2019    Years since quitting: 0.2  . Smokeless tobacco: Never Used  Vaping Use  . Vaping Use: Never used  Substance and Sexual Activity  . Alcohol use: Not Currently  . Drug use: Never  . Sexual activity: Not on file  Other Topics Concern  . Not on file  Social History Narrative  . Not on file   Social Determinants of Health   Financial Resource Strain:   . Difficulty of Paying Living Expenses:   Food Insecurity:   . Worried About Charity fundraiser in the Last Year:   . Arboriculturist in the Last Year:   Transportation Needs:   . Film/video editor (Medical):   Marland Kitchen Lack of Transportation (Non-Medical):   Physical Activity:   . Days of Exercise per Week:   . Minutes of Exercise per Session:   Stress:   . Feeling of Stress :   Social Connections:   . Frequency of Communication with Friends and Family:   . Frequency of Social Gatherings with Friends and Family:   . Attends Religious Services:   . Active Member of Clubs or Organizations:   . Attends Archivist Meetings:   Marland Kitchen Marital Status:      Family History: The patient's family history includes Heart disease in her father; Hypertension in her father; Stroke in her father; Throat cancer in her mother.  ROS:   Please see the history of present illness.    All  other systems reviewed and are negative.  EKGs/Labs/Other Studies Reviewed:    The following studies were reviewed today: HISTORY: Cardiomyopathy, undefined, further testing abnormal lexiscan  EXAM: Cardiac/Coronary  CT  TECHNIQUE: The patient was scanned on a [employer].  PROTOCOL: A 120 kV prospective scan was triggered in the descending thoracic aorta at 111 HU's. Axial non-contrast 3 mm slices were carried out through the heart. The data set was analyzed on a dedicated work station and scored using the Bertie. Gantry rotation speed was 250 msecs and collimation was .6 mm. Beta blockade and 0.8 mg of sl NTG was given. The  3D data set was reconstructed in 5% intervals of the 67-82 % of the R-R cycle. Diastolic phases were analyzed on a dedicated work station using MPR, MIP and VRT modes. The patient received 43mL OMNIPAQUE IOHEXOL 350 MG/ML SOLN of contrast.  FINDINGS: Coronary calcium score is 168, which places the patient in the 98 percentile for age and sex matched control.  Coronary arteries: Normal coronary origins.  Right dominance.  Right Coronary Artery: No detectable plaque or stenosis.  Left Main Coronary Artery: Mild mixed atherosclerotic plaque in the mid left main coronary artery, 25-49% stenosis.  Left Anterior Descending Coronary Artery: Moderate mixed atherosclerotic plaque in the proximal LAD, 50-69% stenosis, with a serial mild mixed atherosclerotic plaque, 25-49% stenosis.  Left Circumflex Artery: Minimal atherosclerotic plaque in the proximal left circumflex, <25% stenosis.  Aorta: Normal size, 29 mm at the mid ascending aorta (level of the PA bifurcation) measured double oblique. Moderate calcifications in descending thoracic . No dissection.  Aortic Valve: No calcifications.  Other findings:  Normal pulmonary vein drainage into the left atrium.  Normal left atrial appendage without a thrombus.  Mildly  dilated main pulmonary artery, 30 mm.  IMPRESSION: 1. Moderate CAD in proximal LAD and mild CAD in mid left main coronary artery, CADRADS = 3. CT FFR will be performed and reported separately.  2. Coronary calcium score is 168, which places the patient in the 21 percentile for age and sex matched control.  3. Normal coronary origin with right dominance.  4. Mildly dilated main pulmonary artery, 30 mm.   Electronically Signed   By: Cherlynn Kaiser   On: 08/17/2019 14:58   Signed by Elouise Munroe, MD on 08/17/2019 3:03 PM  Narrative & Impression  EXAM: OVER-READ INTERPRETATION  CT CHEST  The following report is an over-read performed by radiologist Dr. Vinnie Langton of [facility], [place] on 08/16/2019. This over-read does not include interpretation of cardiac or coronary anatomy or pathology. The coronary calcium score/coronary CTA interpretation by the cardiologist is attached.  COMPARISON:  None.  FINDINGS: Tip of central venous catheter in the distal superior vena cava. Aortic atherosclerosis. Within the visualized portions of the thorax there are no suspicious appearing pulmonary nodules or masses, there is no acute consolidative airspace disease, no pleural effusions, no pneumothorax and no lymphadenopathy. Visualized portions of the upper abdomen are unremarkable. There are no aggressive appearing lytic or blastic lesions noted in the visualized portions of the skeleton.  IMPRESSION: 1.  Aortic Atherosclerosis (ICD10-I70.0).  Electronically Signed: By: Vinnie Langton M.D. On: 08/16/2019 10:50   EXAM: CT FFR ANALYSIS  CLINICAL DATA:  Cardiomyopathy, undefined, further testing  FINDINGS: FFRct analysis was performed on the original cardiac CT angiogram dataset. Diagrammatic representation of the FFRct analysis is provided in a separate PDF document in PACS. This dictation was created using the PDF document and an interactive 3D  model of the results. 3D model is not available in the EMR/PACS. Normal FFR range is >0.80. Indeterminate (grey) zone is 0.76-0.80.  1. Left Main: FFR = 0.97  2. LAD: Proximal FFR = 0.94, Mid FFR = 0.85, Distal FFR = 0.73 3. LCX: Proximal FFR = 0.93, Distal FFR = 0.86 4. RCA: Proximal FFR = 0.95, Mid FFR =0.89, Distal FFR = 0.80  IMPRESSION: 1. CT FFR analysis showed hemodynamically significant FFR in the distal LAD. This is likely secondary to vessel tapering or small vessel disease.  RECOMMENDATIONS: Goal directed medical therapy and aggressive risk factor modification for secondary prevention  of coronary artery disease.   Electronically Signed   By: Cherlynn Kaiser   On: 08/17/2019 21:20    Recent Labs: 06/26/2019: BUN 20; Creatinine, Ser 0.90; Potassium 4.3; Sodium 136; TSH 1.830  Recent Lipid Panel No results found for: CHOL, TRIG, HDL, CHOLHDL, VLDL, LDLCALC, LDLDIRECT  Physical Exam:    VS:  BP 118/66   Pulse 98   Ht 5\' 3"  (1.6 m)   Wt (!) 221 lb (100.2 kg)   SpO2 98%   BMI 39.15 kg/m     Wt Readings from Last 3 Encounters:  09/06/19 (!) 221 lb (100.2 kg)  07/17/19 224 lb (101.6 kg)  07/10/19 221 lb (100.2 kg)     GEN: Patient is in no acute distress HEENT: Normal NECK: No JVD; No carotid bruits LYMPHATICS: No lymphadenopathy CARDIAC: Hear sounds regular, 2/6 systolic murmur at the apex. RESPIRATORY:  Clear to auscultation without rales, wheezing or rhonchi  ABDOMEN: Soft, non-tender, non-distended MUSCULOSKELETAL:  No edema; No deformity  SKIN: Warm and dry NEUROLOGIC:  Alert and oriented x 3 PSYCHIATRIC:  Normal affect   Signed, Jenean Lindau, MD  09/06/2019 9:57 AM    [facility]

## 2019-09-07 ENCOUNTER — Telehealth: Payer: Self-pay

## 2019-09-07 DIAGNOSIS — I10 Essential (primary) hypertension: Secondary | ICD-10-CM

## 2019-09-07 DIAGNOSIS — E785 Hyperlipidemia, unspecified: Secondary | ICD-10-CM

## 2019-09-07 LAB — BASIC METABOLIC PANEL
BUN/Creatinine Ratio: 10 (ref 9–23)
BUN: 9 mg/dL (ref 6–24)
CO2: 25 mmol/L (ref 20–29)
Calcium: 9.6 mg/dL (ref 8.7–10.2)
Chloride: 98 mmol/L (ref 96–106)
Creatinine, Ser: 0.88 mg/dL (ref 0.57–1.00)
GFR calc Af Amer: 86 mL/min/{1.73_m2} (ref >59)
GFR calc non Af Amer: 74 mL/min/{1.73_m2} (ref >59)
Glucose: 85 mg/dL (ref 65–99)
Potassium: 4.1 mmol/L (ref 3.5–5.2)
Sodium: 137 mmol/L (ref 134–144)

## 2019-09-07 LAB — LIPID PANEL
Chol/HDL Ratio: 5.3 ratio — ABNORMAL HIGH (ref 0.0–4.4)
Cholesterol, Total: 202 mg/dL — ABNORMAL HIGH (ref 100–199)
HDL: 38 mg/dL — ABNORMAL LOW (ref >39)
LDL Chol Calc (NIH): 135 mg/dL — ABNORMAL HIGH (ref 0–99)
Triglycerides: 163 mg/dL — ABNORMAL HIGH (ref 0–149)
VLDL Cholesterol Cal: 29 mg/dL (ref 5–40)

## 2019-09-07 LAB — HEPATIC FUNCTION PANEL
ALT: 23 IU/L (ref 0–32)
AST: 20 IU/L (ref 0–40)
Albumin: 4.1 g/dL (ref 3.8–4.9)
Alkaline Phosphatase: 147 IU/L — ABNORMAL HIGH (ref 48–121)
Bilirubin Total: 0.4 mg/dL (ref 0.0–1.2)
Bilirubin, Direct: 0.1 mg/dL (ref 0.00–0.40)
Total Protein: 6.6 g/dL (ref 6.0–8.5)

## 2019-09-07 MED ORDER — ATORVASTATIN CALCIUM 10 MG PO TABS: 10.0000 mg | ORAL_TABLET | Freq: Every day | ORAL | 3 refills | Status: DC

## 2019-09-07 NOTE — Telephone Encounter (Signed)
-----   Message from Jenean Lindau, MD sent at 09/07/2019 10:09 AM EDT ----- Lipids are elevated.  Diet.  Atorvastatin 10 mg daily and liver lipid check in 6 weeks.  Copy primary care doctor. Jenean Lindau, MD 09/07/2019 10:09 AM

## 2019-09-07 NOTE — Telephone Encounter (Signed)
Spoke with patient regarding results and recommendation.  Patient verbalizes understanding and is agreeable to plan of care. Advised patient to call back with any issues or concerns.  

## 2019-09-14 DIAGNOSIS — C50912 Malignant neoplasm of unspecified site of left female breast: Secondary | ICD-10-CM

## 2019-10-17 ENCOUNTER — Ambulatory Visit: Payer: BC Managed Care – PPO | Admitting: Cardiology

## 2019-10-30 LAB — BASIC METABOLIC PANEL
BUN/Creatinine Ratio: 18 (ref 9–23)
BUN: 12 mg/dL (ref 6–24)
CO2: 24 mmol/L (ref 20–29)
Calcium: 9.2 mg/dL (ref 8.7–10.2)
Chloride: 102 mmol/L (ref 96–106)
Creatinine, Ser: 0.65 mg/dL (ref 0.57–1.00)
GFR calc Af Amer: 116 mL/min/{1.73_m2} (ref >59)
GFR calc non Af Amer: 100 mL/min/{1.73_m2} (ref >59)
Glucose: 86 mg/dL (ref 65–99)
Potassium: 4 mmol/L (ref 3.5–5.2)
Sodium: 142 mmol/L (ref 134–144)

## 2019-11-23 ENCOUNTER — Encounter: Payer: Self-pay | Admitting: Pharmacist

## 2019-11-23 DIAGNOSIS — C50912 Malignant neoplasm of unspecified site of left female breast: Secondary | ICD-10-CM | POA: Insufficient documentation

## 2019-11-23 HISTORY — DX: Malignant neoplasm of unspecified site of left female breast: C50.912

## 2019-12-07 ENCOUNTER — Encounter: Payer: Self-pay | Admitting: Cardiology

## 2019-12-07 ENCOUNTER — Ambulatory Visit (INDEPENDENT_AMBULATORY_CARE_PROVIDER_SITE_OTHER): Payer: BC Managed Care – PPO | Admitting: Cardiology

## 2019-12-07 ENCOUNTER — Other Ambulatory Visit: Payer: Self-pay

## 2019-12-07 VITALS — BP 109/70 | HR 70 | Ht 63.0 in | Wt 224.2 lb

## 2019-12-07 DIAGNOSIS — I10 Essential (primary) hypertension: Secondary | ICD-10-CM

## 2019-12-07 DIAGNOSIS — I2510 Atherosclerotic heart disease of native coronary artery without angina pectoris: Secondary | ICD-10-CM | POA: Diagnosis not present

## 2019-12-07 DIAGNOSIS — I429 Cardiomyopathy, unspecified: Secondary | ICD-10-CM | POA: Diagnosis not present

## 2019-12-07 DIAGNOSIS — F17200 Nicotine dependence, unspecified, uncomplicated: Secondary | ICD-10-CM

## 2019-12-07 DIAGNOSIS — Z79899 Other long term (current) drug therapy: Secondary | ICD-10-CM

## 2019-12-07 DIAGNOSIS — E785 Hyperlipidemia, unspecified: Secondary | ICD-10-CM

## 2019-12-07 DIAGNOSIS — Z1329 Encounter for screening for other suspected endocrine disorder: Secondary | ICD-10-CM

## 2019-12-07 LAB — BASIC METABOLIC PANEL
BUN/Creatinine Ratio: 15 (ref 9–23)
BUN: 14 mg/dL (ref 6–24)
CO2: 24 mmol/L (ref 20–29)
Calcium: 9.9 mg/dL (ref 8.7–10.2)
Chloride: 100 mmol/L (ref 96–106)
Creatinine, Ser: 0.91 mg/dL (ref 0.57–1.00)
GFR calc Af Amer: 82 mL/min/{1.73_m2} (ref >59)
GFR calc non Af Amer: 71 mL/min/{1.73_m2} (ref >59)
Glucose: 88 mg/dL (ref 65–99)
Potassium: 4.2 mmol/L (ref 3.5–5.2)
Sodium: 138 mmol/L (ref 134–144)

## 2019-12-07 LAB — LIPID PANEL
Chol/HDL Ratio: 5.6 ratio — ABNORMAL HIGH (ref 0.0–4.4)
Cholesterol, Total: 230 mg/dL — ABNORMAL HIGH (ref 100–199)
HDL: 41 mg/dL (ref >39)
LDL Chol Calc (NIH): 152 mg/dL — ABNORMAL HIGH (ref 0–99)
Triglycerides: 205 mg/dL — ABNORMAL HIGH (ref 0–149)
VLDL Cholesterol Cal: 37 mg/dL (ref 5–40)

## 2019-12-07 LAB — HEPATIC FUNCTION PANEL
ALT: 19 IU/L (ref 0–32)
AST: 16 IU/L (ref 0–40)
Albumin: 4.2 g/dL (ref 3.8–4.9)
Alkaline Phosphatase: 96 IU/L (ref 44–121)
Bilirubin Total: 0.3 mg/dL (ref 0.0–1.2)
Bilirubin, Direct: 0.11 mg/dL (ref 0.00–0.40)
Total Protein: 7.1 g/dL (ref 6.0–8.5)

## 2019-12-07 LAB — TSH: TSH: 0.208 u[IU]/mL — ABNORMAL LOW (ref 0.450–4.500)

## 2019-12-07 NOTE — Addendum Note (Signed)
Addended by: Truddie Hidden on: 12/07/2019 09:52 AM   Modules accepted: Orders

## 2019-12-07 NOTE — Progress Notes (Signed)
Cardiology Office Note:    Date:  12/07/2019   ID:  Brandi Weaver, DOB 26-Oct-1965, MRN [id number]  PCP:  Wyline Copas Hewitt Shorts, PA-C  Cardiologist:  Jenean Lindau, MD   Referring MD: Wyline Copas Hewitt Shorts, PA-C    ASSESSMENT:    1. Coronary artery disease involving native coronary artery of native heart without angina pectoris   2. Cardiomyopathy, unspecified type ([place])   3. Primary hypertension   4. Morbid (severe) obesity due to excess calories ([place])   5. Tobacco use disorder    PLAN:    In order of problems listed above:  1. Coronary artery disease: Secondary prevention stressed with the patient.  Importance of compliance with diet medication stressed and she vocalized understanding.  She promises to do better with exercise and losing weight 2. Cardiomyopathy: Ejection fraction mildly depressed.  She is on Entresto and tolerating it well.  She will have a Chem-7 today.  We will do echocardiogram before next visit in 3 months. 3. Essential hypertension: Blood pressure is stable and borderline and I discussed symptoms of orthostatic hypotension.  And discussed also precautions and measures to counteract. 4. Mixed dyslipidemia: Diet was emphasized and she is on statin therapy.  She will have liver lipid tested today. 5. Cigarette smoker: Quit smoking 4 months ago and promises to never get back. 6. Obesity: Diet was emphasized and weight reduction was stressed and this of obesity explained and she promises to do better. 7. Patient will be seen in follow-up appointment in 3 months or earlier if the patient has any concerns.  I noticed that she has features of exophthalmos-like presentation and therefore I will do a TSH.    Medication Adjustments/Labs and Tests Ordered: Current medicines are reviewed at length with the patient today.  Concerns regarding medicines are outlined above.  No orders of the defined types were placed in this encounter.  No orders of the defined types were  placed in this encounter.    No chief complaint on file.    History of Present Illness:    Brandi Weaver is a 54 y.o. female.  Patient has past medical history of coronary artery disease, mild cardiomyopathy essential hypertension dyslipidemia and morbid obesity.  She denies any problems at this time and takes care of activities of daily living.  No chest pain orthopnea or PND.  She leads a sedentary lifestyle and does not exercise on a regular basis.  At the time of my evaluation, the patient is alert awake oriented and in no distress.  Past Medical History:  Diagnosis Date  . CAD (coronary artery disease) 09/06/2019  . Cardiomyopathy ([place]) 06/26/2019  . Chest pain on exertion 10/18/2016  . Hypertension 10/18/2016  . Malignant neoplasm of left breast ([place])   . Malignant neoplasm of left female breast ([place]) 11/23/2019  . Morbid (severe) obesity due to excess calories ([place]) 06/25/2019   Formatting of this note might be different from the original. per last BMI of 41.64 on 02/13/2018  . Tobacco use disorder 10/19/2016    Past Surgical History:  Procedure Laterality Date  . ABLATION    . TUBAL LIGATION      Current Medications: Current Meds  Medication Sig  . amLODipine (NORVASC) 5 MG tablet Take 5 mg by mouth daily.  Marland Kitchen aspirin EC 81 MG tablet Take 81 mg by mouth daily. Swallow whole.  . loratadine (CLARITIN) 10 MG tablet Take 10 mg by mouth daily.  . methimazole (TAPAZOLE) 10 MG tablet  Take 30 mg by mouth daily.  Marland Kitchen nystatin (MYCOSTATIN/NYSTOP) powder Apply 1 application topically 2 (two) times daily.  . ondansetron (ZOFRAN) 4 MG tablet Take 4 mg by mouth every 4 (four) hours as needed.  Marland Kitchen oxyCODONE (OXY IR/ROXICODONE) 5 MG immediate release tablet Take 5 mg by mouth 4 (four) times daily as needed.  . prochlorperazine (COMPAZINE) 10 MG tablet Take 10 mg by mouth every 6 (six) hours as needed.  . sacubitril-valsartan (ENTRESTO) 24-26 MG Take 1 tablet by mouth 2 (two) times daily.   . traMADol (ULTRAM) 50 MG tablet Take 1 tablet by mouth every 6 (six) hours as needed.     Allergies:   Sulfa antibiotics   Social History   Socioeconomic History  . Marital status: Widowed    Spouse name: Not on file  . Number of children: Not on file  . Years of education: Not on file  . Highest education level: Not on file  Occupational History  . Not on file  Tobacco Use  . Smoking status: Former Smoker    Types: Cigarettes    Quit date: 06/05/2019    Years since quitting: 0.5  . Smokeless tobacco: Never Used  Vaping Use  . Vaping Use: Never used  Substance and Sexual Activity  . Alcohol use: Not Currently  . Drug use: Never  . Sexual activity: Not on file  Other Topics Concern  . Not on file  Social History Narrative  . Not on file   Social Determinants of Health   Financial Resource Strain:   . Difficulty of Paying Living Expenses: Not on file  Food Insecurity:   . Worried About Charity fundraiser in the Last Year: Not on file  . Ran Out of Food in the Last Year: Not on file  Transportation Needs:   . Lack of Transportation (Medical): Not on file  . Lack of Transportation (Non-Medical): Not on file  Physical Activity:   . Days of Exercise per Week: Not on file  . Minutes of Exercise per Session: Not on file  Stress:   . Feeling of Stress : Not on file  Social Connections:   . Frequency of Communication with Friends and Family: Not on file  . Frequency of Social Gatherings with Friends and Family: Not on file  . Attends Religious Services: Not on file  . Active Member of Clubs or Organizations: Not on file  . Attends Archivist Meetings: Not on file  . Marital Status: Not on file     Family History: The patient's family history includes Heart disease in her father; Hypertension in her father; Stroke in her father; Throat cancer in her mother.  ROS:   Please see the history of present illness.    All other systems reviewed and are  negative.  EKGs/Labs/Other Studies Reviewed:    The following studies were reviewed today: I discussed my findings with the patient at length   Recent Labs: 06/26/2019: TSH 1.830 09/06/2019: ALT 23 10/29/2019: BUN 12; Creatinine, Ser 0.65; Potassium 4.0; Sodium 142  Recent Lipid Panel    Component Value Date/Time   CHOL 202 (H) 09/06/2019 1003   TRIG 163 (H) 09/06/2019 1003   HDL 38 (L) 09/06/2019 1003   CHOLHDL 5.3 (H) 09/06/2019 1003   LDLCALC 135 (H) 09/06/2019 1003    Physical Exam:    VS:  BP 109/70   Pulse 70   Ht 5\' 3"  (1.6 m)   Wt 224 lb 3.2 oz (  101.7 kg)   SpO2 97%   BMI 39.72 kg/m     Wt Readings from Last 3 Encounters:  12/07/19 224 lb 3.2 oz (101.7 kg)  09/06/19 (!) 221 lb (100.2 kg)  07/17/19 224 lb (101.6 kg)     GEN: Patient is in no acute distress HEENT: Normal NECK: No JVD; No carotid bruits LYMPHATICS: No lymphadenopathy CARDIAC: Hear sounds regular, 2/6 systolic murmur at the apex. RESPIRATORY:  Clear to auscultation without rales, wheezing or rhonchi  ABDOMEN: Soft, non-tender, non-distended MUSCULOSKELETAL:  No edema; No deformity  SKIN: Warm and dry NEUROLOGIC:  Alert and oriented x 3 PSYCHIATRIC:  Normal affect   Signed, Jenean Lindau, MD  12/07/2019 9:37 AM    [facility]

## 2019-12-07 NOTE — Patient Instructions (Signed)
Medication Instructions:  No medication changes. *If you need a refill on your cardiac medications before your next appointment, please call your pharmacy*   Lab Work: Your physician recommends that you have labs done in the office today. Your test included  basic metabolic panel, TSH, liver function and lipids.  If you have labs (blood work) drawn today and your tests are completely normal, you will receive your results only by: Marland Kitchen MyChart Message (if you have MyChart) OR . A paper copy in the mail If you have any lab test that is abnormal or we need to change your treatment, we will call you to review the results.   Testing/Procedures: Your physician has requested that you have an echocardiogram. Echocardiography is a painless test that uses sound waves to create images of your heart. It provides your doctor with information about the size and shape of your heart and how well your heart's chambers and valves are working. This procedure takes approximately one hour. There are no restrictions for this procedure.     Follow-Up: At [facility], you and your health needs are our priority.  As part of our continuing mission to provide you with exceptional heart care, we have created designated Provider Care Teams.  These Care Teams include your primary Cardiologist (physician) and Advanced Practice Providers (APPs -  Physician Assistants and Nurse Practitioners) who all work together to provide you with the care you need, when you need it.  We recommend signing up for the patient portal called "MyChart".  Sign up information is provided on this After Visit Summary.  MyChart is used to connect with patients for Virtual Visits (Telemedicine).  Patients are able to view lab/test results, encounter notes, upcoming appointments, etc.  Non-urgent messages can be sent to your provider as well.   To learn more about what you can do with MyChart, go to [email or web address].    Your next  appointment:   3 month(s)  The format for your next appointment:   In Person  Provider:   Jyl Heinz, MD   Other Instructions  Echocardiogram An echocardiogram is a procedure that uses painless sound waves (ultrasound) to produce an image of the heart. Images from an echocardiogram can provide important information about:  Signs of coronary artery disease (CAD).  Aneurysm detection. An aneurysm is a weak or damaged part of an artery wall that bulges out from the normal force of blood pumping through the body.  Heart size and shape. Changes in the size or shape of the heart can be associated with certain conditions, including heart failure, aneurysm, and CAD.  Heart muscle function.  Heart valve function.  Signs of a past heart attack.  Fluid buildup around the heart.  Thickening of the heart muscle.  A tumor or infectious growth around the heart valves. Tell a health care provider about:  Any allergies you have.  All medicines you are taking, including vitamins, herbs, eye drops, creams, and over-the-counter medicines.  Any blood disorders you have.  Any surgeries you have had.  Any medical conditions you have.  Whether you are pregnant or may be pregnant. What are the risks? Generally, this is a safe procedure. However, problems may occur, including:  Allergic reaction to dye (contrast) that may be used during the procedure. What happens before the procedure? No specific preparation is needed. You may eat and drink normally. What happens during the procedure?   An IV tube may be inserted into one of your veins.  You  may receive contrast through this tube. A contrast is an injection that improves the quality of the pictures from your heart.  A gel will be applied to your chest.  A wand-like tool (transducer) will be moved over your chest. The gel will help to transmit the sound waves from the transducer.  The sound waves will harmlessly bounce off of  your heart to allow the heart images to be captured in real-time motion. The images will be recorded on a computer. The procedure may vary among health care providers and hospitals. What happens after the procedure?  You may return to your normal, everyday life, including diet, activities, and medicines, unless your health care provider tells you not to do that. Summary  An echocardiogram is a procedure that uses painless sound waves (ultrasound) to produce an image of the heart.  Images from an echocardiogram can provide important information about the size and shape of your heart, heart muscle function, heart valve function, and fluid buildup around your heart.  You do not need to do anything to prepare before this procedure. You may eat and drink normally.  After the echocardiogram is completed, you may return to your normal, everyday life, unless your health care provider tells you not to do that. This information is not intended to replace advice given to you by your health care provider. Make sure you discuss any questions you have with your health care provider. Document Revised: 05/18/2018 Document Reviewed: 02/28/2016 Elsevier Patient Education  [place].

## 2019-12-10 ENCOUNTER — Telehealth: Payer: Self-pay

## 2019-12-10 NOTE — Telephone Encounter (Signed)
I don't know what this is from, off chemo.but had surgery recently so could be related to that.  I rec. She cont. Benadryl and I will be seeing her next week.

## 2019-12-10 NOTE — Telephone Encounter (Signed)
Pt has had hives twice daily for last 5 days. Cannot get in to see PCP until end of December.  Is taking Benadryl relief, but symptoms return.  No new foods, drinks, laundry det, etc.  Do you have any recommendations?  Thanks [phone].

## 2019-12-11 MED ORDER — ATORVASTATIN CALCIUM 20 MG PO TABS: 20.0000 mg | ORAL_TABLET | Freq: Every day | ORAL | 3 refills | Status: DC

## 2019-12-11 NOTE — Addendum Note (Signed)
Addended by: Truddie Hidden on: 12/11/2019 10:10 AM   Modules accepted: Orders

## 2019-12-12 ENCOUNTER — Encounter: Payer: Self-pay | Admitting: Surgery

## 2019-12-13 ENCOUNTER — Other Ambulatory Visit: Payer: Self-pay

## 2019-12-13 ENCOUNTER — Ambulatory Visit (INDEPENDENT_AMBULATORY_CARE_PROVIDER_SITE_OTHER): Payer: BC Managed Care – PPO

## 2019-12-13 DIAGNOSIS — I429 Cardiomyopathy, unspecified: Secondary | ICD-10-CM

## 2019-12-13 DIAGNOSIS — I2510 Atherosclerotic heart disease of native coronary artery without angina pectoris: Secondary | ICD-10-CM | POA: Diagnosis not present

## 2019-12-13 LAB — ECHOCARDIOGRAM COMPLETE
Area-P 1/2: 3.77 cm2
Calc EF: 48.6 %
S' Lateral: 4.5 cm
Single Plane A2C EF: 49.1 %
Single Plane A4C EF: 46.9 %

## 2019-12-13 NOTE — Progress Notes (Signed)
Complete echocardiogram performed.  Jimmy Nijah Tejera RDCS, RVT  

## 2019-12-20 NOTE — Progress Notes (Signed)
[place]  [address] [place],    [address] [phone]  Clinic Day:  12/21/2019  Referring physician: Wyline Copas Hewitt Shorts, PA-C   This document serves as a record of services personally performed by Hosie Poisson, MD. It was created on their behalf by [facility] E, a trained medical scribe. The creation of this record is based on the scribe's personal observations and the provider's statements to them.   CHIEF COMPLAINT:  CC: Stage IIA HER2 receptor positive left breast cancer  Current Treatment:  Plan to resume trastuzumab   HISTORY OF PRESENT ILLNESS:  Brandi Weaver is a 53 y.o. female with stage IIA (T0 N1 M0) HER2 receptor positive left breast cancer diagnosed in April 2021.  The patient felt a mass in the left axilla in March and reported this to her primary care provider.  Her last mammogram had been in November 2019 with a diagnostic left mammogram and ultrasound in December.  There was a probable inframammary lymph node over the upper outer quadrant.  Six-month follow-up was recommended.  Chest ultrasound in March revealed multiple hypoechoic lesions of the left axilla with the largest up to 4.6 cm in diameter with another measuring 3.3 cm and another 2.6 cm.  The changes were felt possibly represent lymphadenitis.  Bilateral diagnostic mammogram at [facility] on March 30th revealed at least 7 enlarged intramammary and axillary lymph nodes.  No primary breast lesion was found.  Targeted left breast ultrasound revealed an increase in the previously demonstrated 7 mm intramammary node at 1:30 o'clock, on the left breast, 9 cm from the nipple, measuring 13 mm with diffuse cortical thickening.  There were multiple enlarged left axillary nodes with eccentric cortical thickening with a maximum thickness of 19.8 mm.  Ultrasound-guided biopsy of the left axillary lymph node in April confirmed metastatic carcinoma consistent with breast  primary.  GATA 3 was positive with negative thyroglobulin, negative Napsin A and TTF1, and the PAX8 was equivocal.  Estrogen and progesterone receptors or negative and HER 2 positive.  Ki 67 was 40%.  Staging CT chest, abdomen and pelvis revealed bulky left axillary lymphadenopathy extending to the sub pectoralis nodal station with a 6 mm right upper paratracheal lymph node.  There is no evidence of metastatic disease in the lungs, abdomen or pelvis.  Breast MRI revealed a 1 x 1.2 oval enhancing mass within the posterior upper outer left breast felt to be an intraparenchymal lymph node, no breast masses or seen.  Re-demonstration of the left axillary adenopathy.  On examination the left axillary adenopathy measured approximately 9 x 7 cm.  She had uterine ablation in 2011 for uterine fibroids, then experienced 1 day of uterine bleeding in 2012.  She has had no further menstrual bleeding.  She has never taken hormone replacement therapy.  She does not have family history of breast or ovarian cancer.  We recommended neoadjuvant chemotherapy to include TCHP x 6 cycles with trastuzumab and pertuzumab for one full year.  We referred her to [name] for Port-A-Cath placement.  Unfortunately, echocardiogram on May 4th revealed ejection fraction between 40 and 45%.  This was confirmed with [name] on May 10th, which revealed an ejection fraction of 42%.  Based on these findings, we changed the treatment regimen to docetaxel/carboplatin/trastuzumab [facility]) and eliminated pertuzumab, due to the potential cardiotoxicities from her to targeted therapies.  We also recommended repeat cardiac imaging every 6 weeks and instead of the standard every 12 weeks.  She was referred to [name] and he started her on Entresto and has been monitoring her cardiac function.  She reported some chest tightness to him, so he recommended a [name].  She had quit smoking.  She received her 1st cycle of docetaxel, carboplatin and  trastuzumab on May 14th.  She has some toxicities of decreased appetite, minimal nausea and vomiting, and generalized arthralgias.  Her biggest complaint was generalized achiness and hip pain, felt to be most likely due to [name].  Her pain was not controlled with hydrocodone/APAP 5/325 or 10/325, so she was given oxycodone 5 mg 1-2 tablets every 6 hours as needed for pain.  She was placed on esomeprazole 40 mg daily for acid reflux.  CT imaging in May revealed bulky left axillary lymphadenopathy, which extends to the sub pectoralis nodal station, as well as a small right upper paratracheal lymph node.  No internal mammary or central thoracic metastasis identified.  No evidence of metastatic disease in the abdomen or pelvis.  She continues Entresto daily without difficulty.  She underwent nuclear stress test at [name] office on June 8th, which revealed a fairly stable ejection fraction of 39%.  She was also recommended for CT coronary angiography, which is to be scheduled at [facility].  She underwent a coronary/cardiac CT on July 8th revealed moderate CAD in proximal LAD and mild CAD in mid left main coronary artery, CADRADS = 3, coronary calcium score is 168, which places the patient in the 77 percentile for age and dex matched control, normal coronary origin with right dominance and mildly dilated main pulmonary artery, 30 mm. [name] from August 4th revealed a left ventricular ejection fraction of 39%, previously 42%in May, which was discussed with [name].  We discussed the risks and benefits with the patient again.  Overall, we feel that the benefit outweighs any further risk, as long as she is closely monitored.  Bilateral breast MRI from September 20th revealed smaller left axillary lymph nodes consistent with interval treatment.  However, there is a persistent but smaller circumscribed oval mass in the upper outer quadrant of the left breast.  I have discussed this with [name] of breast  radiology and we both suspect this could represent the breast primary, but biopsy was negative.  [name] imaging from the same day revealed a left ventricular fraction is 39%, stable.  She did quit smoking in April of 2021.    INTERVAL HISTORY:  Ultrasound guided left breast core needle biopsy was pursued on September 30th, however this only revealed lymphoid tissue with reactive lymphoid hyperplasia and was negative for granulomas or malignancy.  No metastatic breast carcinoma could be seen.  She then underwent left axillary lymph node dissection on October 14th with [name].  Surgical pathology from this procedure found tumor present in 3 of 27 lymph nodes (3/27).  One lymph node was positive for macrometastasis, 54m.  Two lymph nodes had micrometastasis.  There were no lymph nodes with isolated tumor cells and no extranodal tumor present.  LKandaceis now here for follow up and is healing well from her surgery.  She still has some numbness and pain of the left upper extremity with significant swelling.  She rates her pain as a 7/10 today.  ECHO from November 4th revealed a normal ejection fraction between 60-65%.  Her  appetite is good, and she has lost 5 pounds since her last visit.  She denies fever, chills or other signs of infection.  She denies nausea, vomiting, bowel issues, or abdominal pain.  She denies sore throat, cough, dyspnea, or chest pain.   REVIEW OF SYSTEMS:  Review of Systems  Musculoskeletal:       Pain of the left axilla with numbness of the left upper extremity  All other systems reviewed and are negative.    VITALS:  Blood pressure (!) 141/80, pulse 89, temperature 98.2 F (36.8 C), temperature source Oral, resp. rate 18, height _0  (1.6 m), weight 219 lb 11.2 oz (99.7 kg), SpO2 99 %.  Wt Readings from Last 3 Encounters:  12/21/19 219 lb 11.2 oz (99.7 kg)  12/07/19 224 lb 3.2 oz (101.7 kg)  09/06/19 (!) 221 lb (100.2 kg)    Body mass index is 38.92 kg/m.  Performance  status (ECOG): 1 - Symptomatic but completely ambulatory  PHYSICAL EXAM:  Physical Exam Constitutional:      General: She is not in acute distress.    Appearance: Normal appearance. She is normal weight.  HENT:     Head: Normocephalic and atraumatic.  Eyes:     General: No scleral icterus.    Extraocular Movements: Extraocular movements intact.     Conjunctiva/sclera: Conjunctivae normal.     Pupils: Pupils are equal, round, and reactive to light.  Cardiovascular:     Rate and Rhythm: Normal rate and regular rhythm.     Pulses: Normal pulses.     Heart sounds: Normal heart sounds. No murmur heard.  No friction rub. No gallop.   Pulmonary:     Effort: Pulmonary effort is normal. No respiratory distress.     Breath sounds: Normal breath sounds.  Chest:     Comments: Both breasts are without masses.  She has a healing incision in the left axilla with swelling in the axilla and upper left arm.   Abdominal:     General: Bowel sounds are normal. There is no distension.     Palpations: Abdomen is soft. There is no mass.     Tenderness: There is no abdominal tenderness.  Musculoskeletal:        General: Normal range of motion.     Cervical back: Normal range of motion and neck supple.     Right lower leg: No edema.     Left lower leg: No edema.  Lymphadenopathy:     Cervical: No cervical adenopathy.  Skin:    General: Skin is warm and dry.  Neurological:     General: No focal deficit present.     Mental Status: She is alert and oriented to person, place, and time. Mental status is at baseline.  Psychiatric:        Mood and Affect: Mood normal.        Behavior: Behavior normal.        Thought Content: Thought content normal.        Judgment: Judgment normal.     LABS:  No flowsheet data found. CMP Latest Ref Rng & Units 12/07/2019 10/29/2019 09/06/2019  Glucose 65 - 99 mg/dL 88 86 85  BUN 6 - 24 mg/dL _1 Creatinine 0.57 - 1.00 mg/dL 0.91 0.65 0.88  Sodium 134 - 144  mmol/L 138 142 137  Potassium 3.5 - 5.2 mmol/L 4.2 4.0 4.1  Chloride 96 - 106 mmol/L 100 102 98  CO2 20 - 29 mmol/L _2 Calcium 8.7 - 10.2 mg/dL 9.9 9.2 9.6  Total Protein 6.0 - 8.5 g/dL 7.1 - 6.6  Total  Bilirubin 0.0 - 1.2 mg/dL 0.3 - 0.4  Alkaline Phos 44 - 121 IU/L 96 - 147(H)  AST 0 - 40 IU/L 16 - 20  ALT 0 - 32 IU/L 19 - 23     No results found for: CEA1 / No results found for: CEA1 No results found for: PSA1 No results found for: XBM841 No results found for: LKG401  No results found for: TOTALPROTELP, ALBUMINELP, A1GS, A2GS, BETS, BETA2SER, GAMS, MSPIKE, SPEI No results found for: TIBC, FERRITIN, IRONPCTSAT No results found for: LDH   STUDIES:  ECHOCARDIOGRAM COMPLETE  Result Date: 12/13/2019    ECHOCARDIOGRAM REPORT   Patient Name:   Brandi Weaver Date of Exam: 12/13/2019 Medical Rec #:  [id number]         Height:       63.0 in Accession #:    [id number]        Weight:       224.2 lb Date of Birth:  [birth year]         BSA:          2.030 m Patient Age:    27 years          BP:           109/70 mmHg Patient Gender: F                 HR:           89 bpm. Exam Location:  [place] Procedure: 2D Echo Indications:    CAD Native Vessel 414.01 / I25.10                 Cardiomyopathy-Unspecified 425.9 / I42.9  History:        Patient has prior history of Echocardiogram examinations, most                 recent 06/12/2019. CAD, Malignant neoplasm of left female breast;                 Risk Factors:Former Smoker and Hypertension.  Sonographer:    Luane School Referring Phys: [place]  1. Left ventricular ejection fraction, by estimation, is 60 to 65%. The left ventricle has normal function. The left ventricle has no regional wall motion abnormalities. Left ventricular diastolic parameters are consistent with Grade I diastolic dysfunction (impaired relaxation). FINDINGS  Left Ventricle: Left ventricular ejection fraction, by estimation, is 60 to 65%. The left  ventricle has normal function. The left ventricle has no regional wall motion abnormalities. The left ventricular internal cavity size was normal in size. There is  borderline left ventricular hypertrophy. Left ventricular diastolic parameters are consistent with Grade I diastolic dysfunction (impaired relaxation). Right Ventricle: The right ventricular size is normal. No increase in right ventricular wall thickness. Right ventricular systolic function is normal. There is normal pulmonary artery systolic pressure. The tricuspid regurgitant velocity is 2.42 m/s, and  with an assumed right atrial pressure of 3 mmHg, the estimated right ventricular systolic pressure is 63.8 mmHg. Left Atrium: Left atrial size was normal in size. Right Atrium: Right atrial size was normal in size. Pericardium: There is no evidence of pericardial effusion. Mitral Valve: The mitral valve is normal in structure. No evidence of mitral valve regurgitation. No evidence of mitral valve stenosis. Tricuspid Valve: The tricuspid valve is normal in structure. Tricuspid valve regurgitation is trivial. No evidence of tricuspid stenosis. Aortic Valve: The aortic valve is normal in structure. Aortic valve regurgitation is not visualized.  No aortic stenosis is present. Pulmonic Valve: The pulmonic valve was normal in structure. Pulmonic valve regurgitation is not visualized. No evidence of pulmonic stenosis. Aorta: The aortic root is normal in size and structure. Venous: The inferior vena cava is normal in size with greater than 50% respiratory variability, suggesting right atrial pressure of 3 mmHg. IAS/Shunts: No atrial level shunt detected by color flow Doppler.  LEFT VENTRICLE PLAX 2D LVIDd:         5.40 cm     Diastology LVIDs:         4.50 cm     LV e' medial:    4.79 cm/s LV PW:         1.20 cm     LV E/e' medial:  11.4 LV IVS:        1.30 cm     LV e' lateral:   7.07 cm/s LVOT diam:     2.30 cm     LV E/e' lateral: 7.8 LV SV:         56 LV SV  Index:   27 LVOT Area:     4.15 cm  LV Volumes (MOD) LV vol d, MOD A2C: 75.8 ml LV vol d, MOD A4C: 68.8 ml LV vol s, MOD A2C: 38.6 ml LV vol s, MOD A4C: 36.5 ml LV SV MOD A2C:     37.2 ml LV SV MOD A4C:     68.8 ml LV SV MOD BP:      35.7 ml RIGHT VENTRICLE             IVC RV S prime:     12.00 cm/s  IVC diam: 1.70 cm TAPSE (M-mode): 2.4 cm LEFT ATRIUM             Index       RIGHT ATRIUM           Index LA diam:        3.80 cm 1.87 cm/m  RA Area:     12.00 cm LA Vol (A2C):   37.0 ml 18.23 ml/m RA Volume:   26.10 ml  12.86 ml/m LA Vol (A4C):   48.9 ml 24.09 ml/m LA Biplane Vol: 43.0 ml 21.18 ml/m  AORTIC VALVE LVOT Vmax:   75.70 cm/s LVOT Vmean:  47.500 cm/s LVOT VTI:    0.134 m  AORTA Ao Root diam: 3.40 cm Ao Asc diam:  3.10 cm MITRAL VALVE               TRICUSPID VALVE MV Area (PHT): 3.77 cm    TR Peak grad:   23.4 mmHg MV Decel Time: 201 msec    TR Vmax:        242.00 cm/s MV E velocity: 54.80 cm/s MV A velocity: 78.60 cm/s  SHUNTS MV E/A ratio:  0.70        Systemic VTI:  0.13 m                            Systemic Diam: 2.30 cm Jyl Heinz MD Electronically signed by Jyl Heinz MD Signature Date/Time: 12/13/2019/12:33:04 PM    Final      She underwent an ultrasound guided left breast core needle biopsy on 11/08/2019 and surgical pathology from this procedure revealed: 1.  Breast, left, needle core biopsy, 1:30 o'clock, 9 cmfn:  -  Lymphoid tissue with reactive lymphoid hyperplasia  -  Negative for granulomas or malignancy  -  No metastatic breast carcinoma seen   She underwent left axillary lymph node dissection on 11/22/2019 and surgical pathology from this procedure revealed: 1.  Lymph nodes, regional resection, left axillary content:  -  Tumor present in three of twenty-seven lymph nodes (3/27)  -  One lymph node with macrometastasis (4 mm)  -  Two lymph nodes with micrometastases (greater than 0.2 to 2 mm and/or greater than 200 cells)  -  No lymph node with isolated tumor cells  identified  -  No extranodal tumor present    Allergies:  Allergies  Allergen Reactions  . Sulfa Antibiotics Hives    Current Medications: Current Outpatient Medications  Medication Sig Dispense Refill  . amLODipine (NORVASC) 5 MG tablet Take 5 mg by mouth daily.    Marland Kitchen aspirin EC 81 MG tablet Take 81 mg by mouth daily. Swallow whole.    Marland Kitchen atorvastatin (LIPITOR) 20 MG tablet Take 1 tablet (20 mg total) by mouth daily. 90 tablet 3  . hydrOXYzine (ATARAX/VISTARIL) 25 MG tablet Take 25 mg by mouth 4 (four) times daily as needed.    . loratadine (CLARITIN) 10 MG tablet Take 10 mg by mouth daily.    . methimazole (TAPAZOLE) 10 MG tablet Take 30 mg by mouth daily.    . nitroGLYCERIN (NITROSTAT) 0.4 MG SL tablet Place 1 tablet (0.4 mg total) under the tongue every 5 (five) minutes as needed. 25 tablet 6  . ondansetron (ZOFRAN) 4 MG tablet Take 4 mg by mouth every 4 (four) hours as needed.    Marland Kitchen oxyCODONE (OXY IR/ROXICODONE) 5 MG immediate release tablet Take 5 mg by mouth 4 (four) times daily as needed.    . prochlorperazine (COMPAZINE) 10 MG tablet Take 10 mg by mouth every 6 (six) hours as needed.    . sacubitril-valsartan (ENTRESTO) 24-26 MG Take 1 tablet by mouth 2 (two) times daily. 60 tablet 3  . traMADol (ULTRAM) 50 MG tablet Take 1 tablet by mouth every 6 (six) hours as needed.     No current facility-administered medications for this visit.     ASSESSMENT & PLAN:   Assessment:   1. 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  No primary breast lesion was found.  She was receiving neoadjuvant docetaxel/carboplatin/trastuzumab and completed 6 cycles at the end of September.  We will continue with maintenance trastuzumab for a total of 1 year which is scheduled to be completed in mid June.  She has had an excellent decrease in her left axillary lymphadenopathy.  She underwent left axillary lymph node dissection on October 14th  with [name].  Surgical pathology from this procedure found tumor present in 3 of 27 lymph nodes (3/27).  One lymph node was positive for macrometastasis, 28m.  Two lymph nodes had micrometastasis.  We will plan to start her back on maintenance trastuzumab next week.  2. Cardiomyopathy with decreased left ventricular ejection fraction.  This is being managed by Dr. RGeraldo Pitter  [name] from September revealed an ejection fraction of 39%, which was stable.  However, most recent ECHO revealed an ejection fraction between 60-65%. She knows to report any new symptoms, such as shortness of breath, chest pain or edema.  3. Generalized pain, felt to be secondary to [name].  This remains mainly in her hips at this time.  It responded to steroids, but because of the cardiomyopathy, I do not really want her to be on steroids continuously.  She therefore has oxycodone 10 mg 1 every 6 hours to use as needed for pain.  She knows to continue Claritin daily.  4. Acid reflux associated with chemotherapy and steroid premedication, on esomeprazole 40 mg daily.   5. Amenorrhea since 2012.  FSH was well within the postmenopausal range.  We discontinued prechemotherapy pregnancy testing.  6. Anemia, felt to be most likely secondary to chemotherapy, which is stable.    Plan: She underwent left axillary lymph node dissection on October 14th with [name].  Surgical pathology from this procedure found tumor present in 3 of 27 lymph nodes (3/27).  One lymph node was positive for macrometastasis, 50m.  Two lymph nodes had micrometastasis.  There were no lymph nodes with isolated tumor cells and no extranodal tumor present.  I will plan to schedule her for an appointment with Dr. POrlene Ermto discuss postoperative radiation therapy.  We will plan to start her back on maintenance trastuzumab next week.  She is healing well from surgery but still has limited range of motion, pain and numbness of her left upper extremity.  She would  benefit from physical therapy, and so I will make the appropriate referal.  The patient and her sister understand the plans discussed today and are in agreement with them.  They know to contact our office if she develops issues requiring immediate clinical assessment.   I provided 22 minutes (4:04 PM - 4:26 PM) of face-to-face time during this this encounter and > 50% was spent counseling as documented under my assessment and plan.    CDerwood Kaplan MD CBrown Memorial Convalescent CenterAT ASt Josephs Surgery Center[address]SNorthfieldNAlaska[address]Dept: [phone] Fax: [phone]  I, LRita Ohara am acting as scribe for CDerwood Kaplan MD  I have reviewed this report as typed by the medical scribe, and it is complete and accurate.

## 2019-12-21 ENCOUNTER — Other Ambulatory Visit: Payer: Self-pay

## 2019-12-21 ENCOUNTER — Inpatient Hospital Stay (INDEPENDENT_AMBULATORY_CARE_PROVIDER_SITE_OTHER): Payer: BC Managed Care – PPO | Admitting: Oncology

## 2019-12-21 ENCOUNTER — Inpatient Hospital Stay: Payer: BC Managed Care – PPO | Attending: Oncology

## 2019-12-21 ENCOUNTER — Encounter: Payer: Self-pay | Admitting: Oncology

## 2019-12-21 VITALS — BP 141/80 | HR 89 | Temp 98.2°F | Resp 18 | Ht 63.0 in | Wt 219.7 lb

## 2019-12-21 DIAGNOSIS — K219 Gastro-esophageal reflux disease without esophagitis: Secondary | ICD-10-CM | POA: Insufficient documentation

## 2019-12-21 DIAGNOSIS — C50412 Malignant neoplasm of upper-outer quadrant of left female breast: Secondary | ICD-10-CM | POA: Insufficient documentation

## 2019-12-21 DIAGNOSIS — C50912 Malignant neoplasm of unspecified site of left female breast: Secondary | ICD-10-CM

## 2019-12-21 DIAGNOSIS — Z171 Estrogen receptor negative status [ER-]: Secondary | ICD-10-CM

## 2019-12-21 DIAGNOSIS — I429 Cardiomyopathy, unspecified: Secondary | ICD-10-CM | POA: Diagnosis not present

## 2019-12-21 DIAGNOSIS — R52 Pain, unspecified: Secondary | ICD-10-CM | POA: Insufficient documentation

## 2019-12-21 DIAGNOSIS — Z5111 Encounter for antineoplastic chemotherapy: Secondary | ICD-10-CM | POA: Insufficient documentation

## 2019-12-21 DIAGNOSIS — Z79899 Other long term (current) drug therapy: Secondary | ICD-10-CM | POA: Insufficient documentation

## 2019-12-21 DIAGNOSIS — C773 Secondary and unspecified malignant neoplasm of axilla and upper limb lymph nodes: Secondary | ICD-10-CM | POA: Diagnosis not present

## 2019-12-21 LAB — CMP (CANCER CENTER ONLY)
ALT: 14 U/L (ref 0–44)
AST: 19 U/L (ref 15–41)
Albumin: 4.3 g/dL (ref 3.5–5.0)
Alkaline Phosphatase: 72 U/L (ref 38–126)
Anion gap: 15 (ref 5–15)
BUN: 15 mg/dL (ref 6–20)
CO2: 22 mmol/L (ref 22–32)
Calcium: 9.7 mg/dL (ref 8.9–10.3)
Chloride: 100 mmol/L (ref 98–111)
Creatinine: 0.85 mg/dL (ref 0.44–1.00)
GFR, Estimated: >60 mL/min (ref >60)
Glucose, Bld: 110 mg/dL — ABNORMAL HIGH (ref 70–99)
Potassium: 3.5 mmol/L (ref 3.5–5.1)
Sodium: 137 mmol/L (ref 135–145)
Total Bilirubin: 0.4 mg/dL (ref 0.3–1.2)
Total Protein: 8 g/dL (ref 6.5–8.1)

## 2019-12-21 LAB — CBC WITH DIFFERENTIAL (CANCER CENTER ONLY)
Abs Immature Granulocytes: 0.02 10*3/uL (ref 0.00–0.07)
Basophils Absolute: 0 10*3/uL (ref 0.0–0.1)
Basophils Relative: 0 %
Eosinophils Absolute: 0.3 10*3/uL (ref 0.0–0.5)
Eosinophils Relative: 4 %
HCT: 37.2 % (ref 36.0–46.0)
Hemoglobin: 12 g/dL (ref 12.0–15.0)
Immature Granulocytes: 0 %
Lymphocytes Relative: 36 %
Lymphs Abs: 2.9 10*3/uL (ref 0.7–4.0)
MCH: 30.4 pg (ref 26.0–34.0)
MCHC: 32.3 g/dL (ref 30.0–36.0)
MCV: 94.2 fL (ref 80.0–100.0)
Monocytes Absolute: 0.5 10*3/uL (ref 0.1–1.0)
Monocytes Relative: 6 %
Neutro Abs: 4.4 10*3/uL (ref 1.7–7.7)
Neutrophils Relative %: 54 %
Platelet Count: 277 10*3/uL (ref 150–400)
RBC: 3.95 MIL/uL (ref 3.87–5.11)
RDW: 13.2 % (ref 11.5–15.5)
WBC Count: 8.1 10*3/uL (ref 4.0–10.5)
nRBC: 0 % (ref 0.0–0.2)

## 2019-12-22 ENCOUNTER — Telehealth: Payer: Self-pay | Admitting: Oncology

## 2019-12-22 NOTE — Telephone Encounter (Signed)
Per 11/12 LOS Patient schedule For 12/8 Labs, F/U.  Also scheduled 11/19, 12/10 Inf.  Request patient to be given Dec's Appt Calendar

## 2019-12-25 ENCOUNTER — Other Ambulatory Visit: Payer: Self-pay

## 2019-12-25 DIAGNOSIS — C50912 Malignant neoplasm of unspecified site of left female breast: Secondary | ICD-10-CM

## 2019-12-26 ENCOUNTER — Other Ambulatory Visit: Payer: Self-pay

## 2019-12-26 MED ORDER — TRAMADOL HCL 50 MG PO TABS: 50.0000 mg | ORAL_TABLET | Freq: Four times a day (QID) | ORAL | 1 refills | Status: DC | PRN

## 2019-12-27 ENCOUNTER — Other Ambulatory Visit: Payer: Self-pay | Admitting: Oncology

## 2019-12-27 DIAGNOSIS — C50912 Malignant neoplasm of unspecified site of left female breast: Secondary | ICD-10-CM

## 2019-12-28 ENCOUNTER — Inpatient Hospital Stay: Payer: BC Managed Care – PPO

## 2019-12-28 ENCOUNTER — Other Ambulatory Visit: Payer: Self-pay

## 2019-12-28 VITALS — BP 130/75 | HR 76 | Temp 97.8°F | Resp 20 | Ht 63.0 in | Wt 223.8 lb

## 2019-12-28 DIAGNOSIS — C50912 Malignant neoplasm of unspecified site of left female breast: Secondary | ICD-10-CM

## 2019-12-28 DIAGNOSIS — Z5111 Encounter for antineoplastic chemotherapy: Secondary | ICD-10-CM | POA: Diagnosis not present

## 2019-12-28 MED ORDER — ALTEPLASE 2 MG IJ SOLR
INTRAMUSCULAR | Status: AC
  Filled 2019-12-28: qty 2

## 2019-12-28 MED ORDER — SODIUM CHLORIDE 0.9 % IV SOLN
Freq: Once | INTRAVENOUS | Status: AC
  Filled 2019-12-28: qty 250

## 2019-12-28 MED ORDER — DIPHENHYDRAMINE HCL 50 MG/ML IJ SOLN
INTRAMUSCULAR | Status: AC
  Filled 2019-12-28: qty 1

## 2019-12-28 MED ORDER — SODIUM CHLORIDE 0.9% FLUSH
10.0000 mL | INTRAVENOUS | Status: DC | PRN
  Administered 2019-12-28: 10 mL
  Filled 2019-12-28: qty 10

## 2019-12-28 MED ORDER — ALTEPLASE 2 MG IJ SOLR
2.0000 mg | Freq: Once | INTRAMUSCULAR | Status: AC | PRN
  Administered 2019-12-28: 2 mg
  Filled 2019-12-28: qty 2

## 2019-12-28 MED ORDER — TRASTUZUMAB-DKST CHEMO 150 MG IV SOLR
6.0000 mg/kg | Freq: Once | INTRAVENOUS | Status: AC
  Administered 2019-12-28: 609 mg via INTRAVENOUS
  Filled 2019-12-28: qty 29

## 2019-12-28 MED ORDER — DIPHENHYDRAMINE HCL 50 MG/ML IJ SOLN
25.0000 mg | Freq: Once | INTRAMUSCULAR | Status: AC
  Administered 2019-12-28: 25 mg via INTRAVENOUS

## 2019-12-28 MED ORDER — ACETAMINOPHEN 325 MG PO TABS
ORAL_TABLET | ORAL | Status: AC
  Filled 2019-12-28: qty 2

## 2019-12-28 MED ORDER — HEPARIN SOD (PORK) LOCK FLUSH 100 UNIT/ML IV SOLN
500.0000 [IU] | Freq: Once | INTRAVENOUS | Status: AC | PRN
  Administered 2019-12-28: 500 [IU]
  Filled 2019-12-28: qty 5

## 2019-12-28 MED ORDER — ACETAMINOPHEN 325 MG PO TABS
650.0000 mg | ORAL_TABLET | Freq: Once | ORAL | Status: AC
  Administered 2019-12-28: 650 mg via ORAL

## 2019-12-28 NOTE — Patient Instructions (Signed)
[facility] [place] Discharge Instructions for Patients Receiving Chemotherapy  Today you received the following chemotherapy agents Trastuzumab  To help prevent nausea and vomiting after your treatment, we encourage you to take your nausea medication as directed.   If you develop nausea and vomiting that is not controlled by your nausea medication, call the clinic.   BELOW ARE SYMPTOMS THAT SHOULD BE REPORTED IMMEDIATELY:  *FEVER GREATER THAN 100.5 F  *CHILLS WITH OR WITHOUT FEVER  NAUSEA AND VOMITING THAT IS NOT CONTROLLED WITH YOUR NAUSEA MEDICATION  *UNUSUAL SHORTNESS OF BREATH  *UNUSUAL BRUISING OR BLEEDING  TENDERNESS IN MOUTH AND THROAT WITH OR WITHOUT PRESENCE OF ULCERS  *URINARY PROBLEMS  *BOWEL PROBLEMS  UNUSUAL RASH Items with * indicate a potential emergency and should be followed up as soon as possible.  Feel free to call the clinic should you have any questions or concerns at The clinic phone number is [phone].  Please show the CHEMO ALERT CARD at check-in to the Emergency Department and triage nurse.   

## 2019-12-28 NOTE — Progress Notes (Signed)
PT STABLE AT TIME OF DISCHARGE 

## 2020-01-15 NOTE — Progress Notes (Signed)
[place]  [address] [place],  [place]  [address] [phone]  Clinic Day:  01/16/2020  Referring physician: Marlene Lard, PA-C    CHIEF COMPLAINT:  CC: Stage IIA HER2 receptor positive left breast cancer  Current Treatment:  Trastuzumab every 3 weeks   HISTORY OF PRESENT ILLNESS:  Brandi Weaver is a 54 y.o. female with stage IIA (T0 N1 M0) HER2 receptor positive left breast cancer diagnosed in April 2021.  The patient felt a mass in the left axilla in March and reported this to her primary care provider.  Her last mammogram had been in November 2019 with a diagnostic left mammogram and ultrasound in December.  There was a probable inframammary lymph node over the upper outer quadrant.  Six-month follow-up was recommended.  Chest ultrasound in March revealed multiple hypoechoic lesions of the left axilla with the largest up to 4.6 cm in diameter with another measuring 3.3 cm and another 2.6 cm.  The changes were felt possibly represent lymphadenitis.  Bilateral diagnostic mammogram at [facility] on March 30th revealed at least 7 enlarged intramammary and axillary lymph nodes.  No primary breast lesion was found.  Targeted left breast ultrasound revealed an increase in the previously demonstrated 7 mm intramammary node at 1:30 o'clock, on the left breast, 9 cm from the nipple, measuring 13 mm with diffuse cortical thickening.  There were multiple enlarged left axillary nodes with eccentric cortical thickening with a maximum thickness of 19.8 mm.  Ultrasound-guided biopsy of the left axillary lymph node in April confirmed metastatic carcinoma consistent with breast primary.  GATA 3 was positive with negative thyroglobulin, negative Napsin A and TTF1, and the PAX8 was equivocal.  Estrogen and progesterone receptors were negative and HER 2 positive.  Ki 67 was 40%.  Staging CT chest, abdomen and pelvis revealed bulky left axillary lymphadenopathy  extending to the sub pectoralis nodal station with a 6 mm right upper paratracheal lymph node.  There is no evidence of metastatic disease in the lungs, abdomen or pelvis.  Breast MRI revealed a 1 x 1.2 oval enhancing mass within the posterior upper outer left breast felt to be an intraparenchymal lymph node, no breast masses or seen.  Re-demonstration of the left axillary adenopathy.  On examination the left axillary adenopathy measured approximately 9 x 7 cm.  She had uterine ablation in 2011 for uterine fibroids, then experienced 1 day of uterine bleeding in 2012.  She has had no further menstrual bleeding.  She has never taken hormone replacement therapy.  She does not have family history of breast or ovarian cancer.  We recommended neoadjuvant chemotherapy to include TCHP x 6 cycles with trastuzumab and pertuzumab for one full year.  We referred her to Dr. Lilia Weaver for Port-A-Cath placement.  Unfortunately, echocardiogram on May 4th revealed ejection fraction between 40 and 45%.  This was confirmed with [name] on May 10th, which revealed an ejection fraction of 42%.  Based on these findings, we changed the treatment regimen to docetaxel/carboplatin/trastuzumab [name]) and eliminated pertuzumab, due to the potential cardiotoxicities from her to targeted therapies.  We also recommended repeat cardiac imaging every 6 weeks and instead of the standard every 12 weeks.  She was referred to Dr. Geraldo Weaver and he started her on Entresto and has been monitoring her cardiac function.  She reported some chest tightness to him, so he recommended a [name].  She had quit smoking.  She received her 1st cycle of docetaxel,  carboplatin and trastuzumab on May 14th.  She has some toxicities of decreased appetite, minimal nausea and vomiting, and generalized arthralgias.  Her biggest complaint was generalized achiness and hip pain, felt to be most likely due to [name].  Her pain was not controlled with hydrocodone/APAP  5/325 or 10/325, so she was given oxycodone 5 mg 1-2 tablets every 6 hours as needed for pain.  She was placed on esomeprazole 40 mg daily for acid reflux.  CT imaging in May revealed bulky left axillary lymphadenopathy, which extends to the sub pectoralis nodal station, as well as a small right upper paratracheal lymph node.  No internal mammary or central thoracic metastasis identified.  No evidence of metastatic disease in the abdomen or pelvis.  She continues Entresto daily without difficulty.  She underwent nuclear stress test at Dr. Julien Weaver office on June 8th, which revealed a fairly stable ejection fraction of 39%.  She was also recommended for CT coronary angiography, which is to be [facility] at [name].  She underwent a coronary/cardiac CT on July 8th revealed moderate CAD in proximal LAD and mild CAD in mid left main coronary artery, CADRADS = 3, coronary calcium score is 168, which places the patient in the 54 percentile for age and dex matched control, normal coronary origin with right dominance and mildly dilated main pulmonary artery, 30 mm. [name] from August 4th revealed a left ventricular ejection fraction of 39%, previously 42%in May, which was discussed with Dr. Geraldo Weaver.  We discussed the risks and benefits with the patient again.  Overall, we feel that the benefit outweighs any further risk, as long as she is closely monitored.  Bilateral breast MRI from September 20th revealed smaller left axillary lymph nodes consistent with interval treatment.  However, there is a persistent but smaller circumscribed oval mass in the upper outer quadrant of the left breast.  I have discussed this with [name] of breast radiology and we both suspect this could represent the breast primary, but biopsy was negative.  [name] imaging from the same day revealed a left ventricular fraction is 39%, stable.  She quit smoking in April 2021.   Ultrasound guided left breast core needle biopsy in September  revealed lymphoid tissue with reactive lymphoid hyperplasia and was negative for granulomas or malignancy.  No metastatic breast carcinoma could be seen.  She then underwent left axillary lymph node dissection on October 14th with Dr. Lilia Weaver.  Surgical pathology from this procedure found tumor present in 3 of 27 lymph nodes (3/27).  One lymph node was positive for macrometastasis, 45m.  Two lymph nodes had micrometastasis.  There were no lymph nodes with isolated tumor cells and no extranodal tumor present. Echocardiogram from November 4th revealed a normal ejection fraction between 60-65%. She was referred to physical therapy for range of motion after her surgery. She started maintenance trastuzumab on November 19th.  INTERVAL HISTORY:  LKarahis here today prior to an 8th cycle of maintenance trastuzumab.  She states she has recovered well from her surgery. She denies any changes in the breasts. She denies fevers or chills. She reports persistent severe left hip pain, with mild pain in the right hip.  Previous left hip x-ray did not reveal any abnormality.  CT imaging in May also did not reveal any abnormality in the hips or lumbar spine.  She has not received chemotherapy or [name] since September, so we do not feel her pain can be attributed to this. She states her appetite is  good. Her weight is stable.  She is alone for her visit today.  REVIEW OF SYSTEMS:  Review of Systems  Constitutional: Negative for appetite change, chills, fatigue, fever and unexpected weight change.  HENT:   Negative for lump/mass, mouth sores and sore throat.   Respiratory: Negative for shortness of breath.   Cardiovascular: Negative for chest pain and leg swelling.  Gastrointestinal: Negative for abdominal pain, constipation, diarrhea, nausea and vomiting.  Genitourinary: Negative for difficulty urinating, dysuria, frequency and hematuria.   Musculoskeletal: Positive for arthralgias and back pain. Negative for flank  pain, myalgias and neck pain.  Skin: Negative for rash.  Neurological: Negative for dizziness, extremity weakness and headaches.  Psychiatric/Behavioral: Negative for depression. The patient is not nervous/anxious.      VITALS:  Vital signs today include a weight of 223.9 lbs, this is a gain of 5 lb, blood pressure 149/66, pulse 70, respirations 18 and temperature 97.9.  Pulse oximetry is 96% on room air.  Wt Readings from Last 3 Encounters:  01/16/20 223 lb 14.4 oz (101.6 kg)  12/28/19 223 lb 12 oz (101.5 kg)  12/21/19 219 lb 11.2 oz (99.7 kg)    There is no height or weight on file to calculate BMI.  Performance status (ECOG): 2 - Symptomatic, <50% confined to bed  PHYSICAL EXAM:  Physical Exam Vitals and nursing note reviewed.  Constitutional:      General: She is not in acute distress.    Appearance: Normal appearance.  HENT:     Mouth/Throat:     Mouth: Mucous membranes are moist.     Pharynx: Oropharynx is clear. No oropharyngeal exudate or posterior oropharyngeal erythema.  Eyes:     General: No scleral icterus.    Extraocular Movements: Extraocular movements intact.     Conjunctiva/sclera: Conjunctivae normal.     Pupils: Pupils are equal, round, and reactive to light.  Cardiovascular:     Rate and Rhythm: Normal rate and regular rhythm.     Heart sounds: Normal heart sounds. No murmur heard. No friction rub. No gallop.   Pulmonary:     Effort: No respiratory distress.     Breath sounds: Normal breath sounds. No stridor. No wheezing, rhonchi or rales.  Chest:     Comments: Breast exam is deferred per patient. Abdominal:     General: There is no distension.     Palpations: Abdomen is soft. There is no mass.     Tenderness: There is no abdominal tenderness. There is no guarding.     Hernia: No hernia is present.  Musculoskeletal:        General: Tenderness (left posterior hip) present.     Cervical back: Neck supple. No tenderness.     Lumbar back: No swelling,  edema, deformity, spasms, tenderness or bony tenderness.     Right lower leg: No tenderness. No edema.     Left lower leg: Tenderness (left posterior hip) present. No edema.     Comments: Positive left straight leg raise. She has difficulty ambulating due to pain.  Skin:    General: Skin is warm.     Coloration: Skin is not jaundiced.     Findings: No rash.  Neurological:     General: No focal deficit present.     Mental Status: She is alert and oriented to person, place, and time.     Cranial Nerves: Cranial nerves are intact.     Sensory: Sensation is intact.     Motor: Motor function  is intact.  Psychiatric:        Mood and Affect: Mood normal.        Behavior: Behavior normal.    LABS:   CBC Latest Ref Rng & Units 01/16/2020 12/21/2019  WBC - 5.7 8.1  Hemoglobin 12.0 - 16.0 11.7(A) 12.0  Hematocrit 36 - 46 35(A) 37.2  Platelets 150 - 399 243 277   CMP Latest Ref Rng & Units 01/16/2020 12/21/2019 12/07/2019  Glucose 70 - 99 mg/dL - 110(H) 88  BUN 4 - _0 Creatinine 0.5 - 1.1 1.0 0.85 0.91  Sodium 137 - 147 140 137 138  Potassium 3.4 - 5.3 3.6 3.5 4.2  Chloride 99 - 108 107 100 100  CO2 13 - 22 24(A) 22 24  Calcium 8.7 - 10.7 9.7 9.7 9.9  Total Protein 6.5 - 8.1 g/dL - 8.0 7.1  Total Bilirubin 0.3 - 1.2 mg/dL - 0.4 0.3  Alkaline Phos 25 - 125 68 72 96  AST 13 - 35 _1 ALT 7 - 35 _2 No results found for: CEA1 / No results found for: CEA1 No results found for: PSA1 No results found for: YWV371 No results found for: GGY694  No results found for: TOTALPROTELP, ALBUMINELP, A1GS, A2GS, BETS, BETA2SER, GAMS, MSPIKE, SPEI No results found for: TIBC, FERRITIN, IRONPCTSAT No results found for: LDH   STUDIES:  No results found.   Allergies:  Allergies  Allergen Reactions  . Sulfa Antibiotics Hives    Current Medications: Current Outpatient Medications  Medication Sig Dispense Refill  . amLODipine (NORVASC) 5 MG tablet Take 5 mg by mouth  daily.    Marland Kitchen aspirin EC 81 MG tablet Take 81 mg by mouth daily. Swallow whole.    Marland Kitchen atorvastatin (LIPITOR) 20 MG tablet Take 1 tablet (20 mg total) by mouth daily. 90 tablet 3  . hydrOXYzine (ATARAX/VISTARIL) 25 MG tablet Take 25 mg by mouth 4 (four) times daily as needed.    . loratadine (CLARITIN) 10 MG tablet Take 10 mg by mouth daily.    . methimazole (TAPAZOLE) 10 MG tablet Take 30 mg by mouth daily.    . nitroGLYCERIN (NITROSTAT) 0.4 MG SL tablet Place 1 tablet (0.4 mg total) under the tongue every 5 (five) minutes as needed. 25 tablet 6  . ondansetron (ZOFRAN) 4 MG tablet Take 4 mg by mouth every 4 (four) hours as needed.    Marland Kitchen oxyCODONE (OXY IR/ROXICODONE) 5 MG immediate release tablet Take 5 mg by mouth 4 (four) times daily as needed.    Marland Kitchen oxyCODONE (OXY IR/ROXICODONE) 5 MG immediate release tablet Take 1 tablet (5 mg total) by mouth every 6 (six) hours as needed for severe pain. 30 tablet 0  . prochlorperazine (COMPAZINE) 10 MG tablet Take 10 mg by mouth every 6 (six) hours as needed.    . sacubitril-valsartan (ENTRESTO) 24-26 MG Take 1 tablet by mouth 2 (two) times daily. 60 tablet 3  . traMADol (ULTRAM) 50 MG tablet Take 1 tablet (50 mg total) by mouth every 6 (six) hours as needed (pain). 60 tablet 1  . traZODone (DESYREL) 50 MG tablet Take 50 mg by mouth at bedtime as needed.     No current facility-administered medications for this visit.     ASSESSMENT & PLAN:   Assessment:   1. 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  No primary  breast lesion was found.  She was receiving neoadjuvant docetaxel/carboplatin/trastuzumab and completed 6 cycles at the end of September.  We will continue with maintenance trastuzumab for a total of 1 year which is scheduled to be completed in mid June.  She has had an excellent decrease in her left axillary lymphadenopathy.  She underwent left axillary lymph node dissection on October 14th  with Dr. Lilia Weaver.  Surgical pathology from this procedure found tumor present in 3 of 27 lymph nodes (3/27).  One lymph node was positive for macrometastasis, 98m.  Two lymph nodes had micrometastasis.  We will plan to start her back on maintenance trastuzumab next week. 2. Cardiomyopathy with decreased left ventricular ejection fraction.  This is being managed by Dr. RGeraldo Weaver  [name] from September revealed an ejection fraction of 39%, which was stable.  However, most recent echocardiogram revealed an ejection fraction between 60-65%. She knows to report any new symptoms, such as shortness of breath, chest pain or edema. 3. Generalized pain, felt to be secondary to [name], which responded to steroids. She no longer receives [name], but had persistent bilateral hip pain, left greater than right with positive left straight leg raise.  We need to be sure this is not related to her cancer, so recommend MRI lumbar spine and left hip.  I will send her in oxycodone 54mone every 6 hours as needed for pain while she undergoes evaluation.  She will then either follow up with her primary care provider or be referred to orthopedics for her back and hip pain. 4. Acid reflux associated with chemotherapy and steroid premedication, on esomeprazole 40 mg daily.  5. Amenorrhea since 2012.  FSH was well within the postmenopausal range.  We discontinued pre-therapy pregnancy testing. 6. Anemia, felt to be most likely secondary to chemotherapy. She has mild anemia despite completing the chemotherapy portion of her treatment.  We will continue to monitor this.   Plan: She will proceed with a an 8th cycle of maintenance trastuzumab on December 10th.  We will plan to see her back in 3 weeks with a CBC and comprehensive metabolic panel prior to a 9th cycle. The patient understands the plans discussed today and is in agreement with them.  She knows to contact our office if she develops issues requiring immediate clinical  assessment.   I provided 20 minutes of face-to-face time during this this encounter and > 50% was spent counseling as documented under my assessment and plan.    KeMarvia PicklesPA-C COSt. Mary'S Hospital And ClinicsT ASChestnut Hill Hospital[address] Coffee CourtTMullinCAlaska[address]ept: [phone] Fax: [phone]

## 2020-01-16 ENCOUNTER — Other Ambulatory Visit: Payer: Self-pay | Admitting: Hematology and Oncology

## 2020-01-16 ENCOUNTER — Encounter: Payer: Self-pay | Admitting: Hematology and Oncology

## 2020-01-16 ENCOUNTER — Inpatient Hospital Stay (INDEPENDENT_AMBULATORY_CARE_PROVIDER_SITE_OTHER): Payer: BC Managed Care – PPO | Admitting: Hematology and Oncology

## 2020-01-16 ENCOUNTER — Inpatient Hospital Stay: Payer: BC Managed Care – PPO | Attending: Hematology and Oncology

## 2020-01-16 ENCOUNTER — Telehealth: Payer: Self-pay | Admitting: Hematology and Oncology

## 2020-01-16 ENCOUNTER — Other Ambulatory Visit: Payer: Self-pay

## 2020-01-16 DIAGNOSIS — M25552 Pain in left hip: Secondary | ICD-10-CM | POA: Diagnosis not present

## 2020-01-16 DIAGNOSIS — K219 Gastro-esophageal reflux disease without esophagitis: Secondary | ICD-10-CM | POA: Insufficient documentation

## 2020-01-16 DIAGNOSIS — C50912 Malignant neoplasm of unspecified site of left female breast: Secondary | ICD-10-CM | POA: Diagnosis not present

## 2020-01-16 DIAGNOSIS — C773 Secondary and unspecified malignant neoplasm of axilla and upper limb lymph nodes: Secondary | ICD-10-CM | POA: Insufficient documentation

## 2020-01-16 DIAGNOSIS — G8929 Other chronic pain: Secondary | ICD-10-CM

## 2020-01-16 DIAGNOSIS — Z171 Estrogen receptor negative status [ER-]: Secondary | ICD-10-CM

## 2020-01-16 DIAGNOSIS — D649 Anemia, unspecified: Secondary | ICD-10-CM | POA: Insufficient documentation

## 2020-01-16 DIAGNOSIS — C50412 Malignant neoplasm of upper-outer quadrant of left female breast: Secondary | ICD-10-CM | POA: Insufficient documentation

## 2020-01-16 DIAGNOSIS — Z5112 Encounter for antineoplastic immunotherapy: Secondary | ICD-10-CM | POA: Insufficient documentation

## 2020-01-16 DIAGNOSIS — Z7982 Long term (current) use of aspirin: Secondary | ICD-10-CM | POA: Insufficient documentation

## 2020-01-16 DIAGNOSIS — Z79899 Other long term (current) drug therapy: Secondary | ICD-10-CM | POA: Insufficient documentation

## 2020-01-16 LAB — CBC AND DIFFERENTIAL
HCT: 35 — AB (ref 36–46)
Hemoglobin: 11.7 — AB (ref 12.0–16.0)
Neutrophils Absolute: 2.96
Platelets: 243 (ref 150–399)
WBC: 5.7

## 2020-01-16 LAB — BASIC METABOLIC PANEL
BUN: 14 (ref 4–21)
CO2: 24 — AB (ref 13–22)
Chloride: 107 (ref 99–108)
Creatinine: 1 (ref 0.5–1.1)
Glucose: 113
Potassium: 3.6 (ref 3.4–5.3)
Sodium: 140 (ref 137–147)

## 2020-01-16 LAB — HEPATIC FUNCTION PANEL
ALT: 16 (ref 7–35)
AST: 28 (ref 13–35)
Alkaline Phosphatase: 68 (ref 25–125)
Bilirubin, Total: 0.6

## 2020-01-16 LAB — COMPREHENSIVE METABOLIC PANEL
Albumin: 4.1 (ref 3.5–5.0)
Calcium: 9.7 (ref 8.7–10.7)

## 2020-01-16 LAB — CBC: RBC: 3.92 (ref 3.87–5.11)

## 2020-01-16 MED ORDER — OXYCODONE HCL 5 MG PO TABS
5.0000 mg | ORAL_TABLET | Freq: Four times a day (QID) | ORAL | 0 refills | Status: DC | PRN
Start: 2020-01-16 — End: 2020-03-07

## 2020-01-16 NOTE — Telephone Encounter (Signed)
Per 12/8 los next appt given to patient

## 2020-01-18 ENCOUNTER — Other Ambulatory Visit: Payer: Self-pay

## 2020-01-18 ENCOUNTER — Inpatient Hospital Stay: Payer: BC Managed Care – PPO

## 2020-01-18 VITALS — BP 122/72 | HR 71 | Temp 98.3°F | Resp 18 | Ht 63.0 in | Wt 227.5 lb

## 2020-01-18 DIAGNOSIS — K219 Gastro-esophageal reflux disease without esophagitis: Secondary | ICD-10-CM | POA: Diagnosis not present

## 2020-01-18 DIAGNOSIS — Z79899 Other long term (current) drug therapy: Secondary | ICD-10-CM | POA: Diagnosis not present

## 2020-01-18 DIAGNOSIS — C50912 Malignant neoplasm of unspecified site of left female breast: Secondary | ICD-10-CM

## 2020-01-18 DIAGNOSIS — Z7982 Long term (current) use of aspirin: Secondary | ICD-10-CM | POA: Diagnosis not present

## 2020-01-18 DIAGNOSIS — Z171 Estrogen receptor negative status [ER-]: Secondary | ICD-10-CM

## 2020-01-18 DIAGNOSIS — Z5112 Encounter for antineoplastic immunotherapy: Secondary | ICD-10-CM | POA: Diagnosis not present

## 2020-01-18 DIAGNOSIS — C773 Secondary and unspecified malignant neoplasm of axilla and upper limb lymph nodes: Secondary | ICD-10-CM | POA: Diagnosis not present

## 2020-01-18 DIAGNOSIS — D649 Anemia, unspecified: Secondary | ICD-10-CM | POA: Diagnosis not present

## 2020-01-18 DIAGNOSIS — C50412 Malignant neoplasm of upper-outer quadrant of left female breast: Secondary | ICD-10-CM | POA: Diagnosis not present

## 2020-01-18 MED ORDER — ACETAMINOPHEN 325 MG PO TABS
ORAL_TABLET | ORAL | Status: AC
  Filled 2020-01-18: qty 2

## 2020-01-18 MED ORDER — ACETAMINOPHEN 325 MG PO TABS
650.0000 mg | ORAL_TABLET | Freq: Once | ORAL | Status: AC
  Administered 2020-01-18: 650 mg via ORAL

## 2020-01-18 MED ORDER — DIPHENHYDRAMINE HCL 50 MG/ML IJ SOLN
INTRAMUSCULAR | Status: AC
  Filled 2020-01-18: qty 1

## 2020-01-18 MED ORDER — TRASTUZUMAB-DKST CHEMO 150 MG IV SOLR
6.0000 mg/kg | Freq: Once | INTRAVENOUS | Status: AC
  Administered 2020-01-18: 609 mg via INTRAVENOUS
  Filled 2020-01-18: qty 29

## 2020-01-18 MED ORDER — DIPHENHYDRAMINE HCL 50 MG/ML IJ SOLN
25.0000 mg | Freq: Once | INTRAMUSCULAR | Status: AC
  Administered 2020-01-18: 25 mg via INTRAVENOUS

## 2020-01-18 MED ORDER — HEPARIN SOD (PORK) LOCK FLUSH 100 UNIT/ML IV SOLN
500.0000 [IU] | Freq: Once | INTRAVENOUS | Status: AC | PRN
  Administered 2020-01-18: 500 [IU]
  Filled 2020-01-18: qty 5

## 2020-01-18 MED ORDER — SODIUM CHLORIDE 0.9 % IV SOLN
Freq: Once | INTRAVENOUS | Status: AC
  Filled 2020-01-18: qty 250

## 2020-01-18 NOTE — Patient Instructions (Signed)
[facility] Discharge Instructions for Patients Receiving Chemotherapy  Today you received the following chemotherapy agents Trastuzumab  To help prevent nausea and vomiting after your treatment, we encourage you to take your nausea medication.   If you develop nausea and vomiting that is not controlled by your nausea medication, call the clinic.   BELOW ARE SYMPTOMS THAT SHOULD BE REPORTED IMMEDIATELY:  *FEVER GREATER THAN 100.5 F  *CHILLS WITH OR WITHOUT FEVER  NAUSEA AND VOMITING THAT IS NOT CONTROLLED WITH YOUR NAUSEA MEDICATION  *UNUSUAL SHORTNESS OF BREATH  *UNUSUAL BRUISING OR BLEEDING  TENDERNESS IN MOUTH AND THROAT WITH OR WITHOUT PRESENCE OF ULCERS  *URINARY PROBLEMS  *BOWEL PROBLEMS  UNUSUAL RASH Items with * indicate a potential emergency and should be followed up as soon as possible.  Feel free to call the clinic should you have any questions or concerns at The clinic phone number is [phone].  Please show the [facility] at check-in to the Emergency Department and triage nurse.

## 2020-01-18 NOTE — Progress Notes (Signed)
Pt stable at time of discharge (1104).

## 2020-01-28 NOTE — Progress Notes (Signed)
PT STABLE AT TIME OF DISCHARGE 

## 2020-01-29 ENCOUNTER — Telehealth: Payer: Self-pay | Admitting: Hematology and Oncology

## 2020-01-29 NOTE — Telephone Encounter (Signed)
12/21 lft vm-MRI Sched on 12/27@1015am 

## 2020-02-01 ENCOUNTER — Other Ambulatory Visit: Payer: Self-pay | Admitting: Oncology

## 2020-02-05 NOTE — Progress Notes (Signed)
[name]  [address] [place],  [place]  [address] [phone]  Clinic Day:  02/07/2020  Referring physician: Marlene Lard, PA-C   CHIEF COMPLAINT:  CC: Stage IIA HER2 receptor positive left breast cancer  Current Treatment: Trastuzumab every 3 weeks.   HISTORY OF PRESENT ILLNESS:  Brandi Weaver is a 54 y.o. female with a history of stage IIA (T0 N1 M0) HER2 receptor positive left breast cancer diagnosed in April 2021.  The patient felt a mass in the left axilla in March and reported this to her primary care provider.  Her last mammogram had been in November 2019 with a diagnostic left mammogram and ultrasound in December.  There was a probable inframammary lymph node over the upper outer quadrant.  Six-month follow-up was recommended.  Chest ultrasound in March revealed multiple hypoechoic lesions of the left axilla with the largest up to 4.6 cm in diameter with another measuring 3.3 cm and another 2.6 cm.  The changes were felt possibly represent lymphadenitis.  Bilateral diagnostic mammogram at [facility] on March 30th revealed at least 7 enlarged intramammary and axillary lymph nodes.  No primary breast lesion was found.  Targeted left breast ultrasound revealed an increase in the previously demonstrated 7 mm intramammary node at 1:30 o'clock, on the left breast, 9 cm from the nipple, measuring 13 mm with diffuse cortical thickening.  There were multiple enlarged left axillary nodes with eccentric cortical thickening with a maximum thickness of 19.8 mm.  Ultrasound-guided biopsy of the left axillary lymph node in April confirmed metastatic carcinoma consistent with breast primary.  GATA 3 was positive with negative thyroglobulin, negative Napsin A and TTF1, and the PAX8 was equivocal.  Estrogen and progesterone receptors were negative and HER 2 positive.  Ki 67 was 40%.  Staging CT chest, abdomen and pelvis revealed bulky left axillary  lymphadenopathy extending to the sub pectoralis nodal station with a 6 mm right upper paratracheal lymph node.  There was no evidence of metastatic disease in the lungs, abdomen or pelvis.  Breast MRI revealed a 1 x 1.2 oval enhancing mass within the posterior upper outer left breast felt to be an intraparenchymal lymph node, no breast masses were seen.  Re-demonstration of the left axillary adenopathy.  On examination the left axillary adenopathy measured approximately 9 x 7 cm.  She had uterine ablation in 2011 for uterine fibroids, then experienced 1 day of uterine bleeding in 2012, but denies further bleeding.   We recommended neoadjuvant chemotherapy to include TCHP x 6 cycles with trastuzumab and pertuzumab for one full year.  Unfortunately, echocardiogram on May 4th revealed ejection fraction between 40 and 45%.  This was confirmed with [name] on May 10th, which revealed an ejection fraction of 42%.  Based on these findings, we changed the treatment regimen to docetaxel/carboplatin/trastuzumab [facility]) and eliminated pertuzumab, due to the potential cardiotoxicities from her to targeted therapies.  We also recommended repeat cardiac imaging every 6 weeks and instead of the standard every 12 weeks.  She was referred to [name] and he started her on Entresto and has been monitoring her cardiac function.  She reported some chest tightness to him, so he recommended a [name].  She had quit smoking.  She received her 1st cycle of docetaxel, carboplatin and trastuzumab on May 14th.  She has some toxicities of decreased appetite, minimal nausea and vomiting, and generalized arthralgias.  Her biggest complaint was generalized achiness and hip pain, felt  to be most likely due to [name].  Her pain was not controlled with hydrocodone/APAP 5/325 or 10/325, so she was given oxycodone 5 mg 1-2 tablets every 6 hours as needed for pain.  She was placed on esomeprazole 40 mg daily for acid reflux.  CT imaging  in May revealed bulky left axillary lymphadenopathy, which extends to the sub pectoralis nodal station, as well as a small right upper paratracheal lymph node.  No internal mammary or central thoracic metastasis identified.  No evidence of metastatic disease in the abdomen or pelvis.  She continues Entresto daily without difficulty.  She underwent nuclear stress test at [name] office on June 8th, which revealed a fairly stable ejection fraction of 39%.  She was also recommended for CT coronary angiography, which is to be scheduled at [facility].  She underwent a coronary/cardiac CT on July 8th revealed moderate CAD in proximal LAD and mild CAD in mid left main coronary artery, CADRADS = 3, coronary calcium score is 168, which places the patient in the 67 percentile for age and dex matched control, normal coronary origin with right dominance and mildly dilated main pulmonary artery, 30 mm. [name] from August 4th revealed a left ventricular ejection fraction of 39%, previously 42%in May, which was discussed with [name].  We discussed the risks and benefits with the patient again.  Overall, we feel that the benefit outweighs any further risk, as long as she is closely monitored.  Bilateral breast MRI from September 20th revealed smaller left axillary lymph nodes consistent with interval treatment.  However, there is a persistent but smaller circumscribed oval mass in the upper outer quadrant of the left breast.  We discussed this with [name] of breast radiology and we suspect this could represent the breast primary, but biopsy was negative.  [name] imaging from the same day revealed a left ventricular fraction is 39%, stable.  She quit smoking in April 2021.   Ultrasound guided left breast core needle biopsy in September revealed lymphoid tissue with reactive lymphoid hyperplasia and was negative for granulomas or malignancy.  No metastatic breast carcinoma could be seen.  She then underwent left  axillary lymph node dissection on October 14th with [name].  Surgical pathology from this procedure found tumor present in 3 of 27 lymph nodes (3/27).  One lymph node was positive for macrometastasis, 54m.  Two lymph nodes had micrometastasis.  There were no lymph nodes with isolated tumor cells and no extranodal tumor present. Echocardiogram from November 4th revealed a normal ejection fraction between 60-65%. She was referred to physical therapy for range of motion after her surgery. She started maintenance trastuzumab on November 19th.  She continued to report persistent severe left hip pain, with mild pain in the right hip.  Previous left hip x-ray did not reveal any abnormality.  CT imaging in May also did not reveal any abnormality in the hips or lumbar spine.  She has not received chemotherapy or [name] since September, so we did not feel her pain can be attributed to this. I therefore recommended an MRI for further evaluation.  INTERVAL HISTORY:  LVertieis here today prior to a 9th cycle of maintenance trastuzumab.  She had her MRI left hip and lumbar spine this morning, but we do not have the results.  Unfortunately, she is in severe pain at this time, as she did not bring her pain medication with her.  She will be able to take this as soon as  she arrives home.  She denies any changes in her breasts.  He denies palpable adenopathy. She denies fevers or chills. She  denies pain. Her appetite is good. Her weight has increased 7 pounds over last 3 weeks.  REVIEW OF SYSTEMS:  Review of Systems  Constitutional: Negative for appetite change, chills, fatigue, fever and unexpected weight change.  HENT:   Negative for lump/mass, mouth sores and sore throat.   Respiratory: Negative for cough and shortness of breath.   Cardiovascular: Negative for chest pain and leg swelling.  Gastrointestinal: Negative for abdominal pain, constipation, diarrhea, nausea and vomiting.  Genitourinary: Negative for  difficulty urinating, dysuria, frequency and hematuria.   Musculoskeletal: Positive for arthralgias (Bilateral hips) and back pain (Low back). Negative for myalgias.  Skin: Negative for rash.  Neurological: Negative for dizziness and headaches.  Psychiatric/Behavioral: Negative for depression and sleep disturbance. The patient is not nervous/anxious.      VITALS:  Blood pressure (!) 155/74, pulse 69, temperature 98.2 F (36.8 C), temperature source Oral, resp. rate 18, height _0  (1.6 m), weight 225 lb 11.2 oz (102.4 kg), SpO2 95 %.  Wt Readings from Last 3 Encounters:  02/06/20 225 lb 11.2 oz (102.4 kg)  01/17/20 224 lb 9 oz (101.9 kg)  01/18/20 227 lb 8 oz (103.2 kg)    Body mass index is 39.98 kg/m.  Performance status (ECOG): 1 - Symptomatic but completely ambulatory  PHYSICAL EXAM:  Physical Exam Vitals and nursing note reviewed.  Constitutional:      General: She is not in acute distress.    Appearance: Normal appearance.  HENT:     Mouth/Throat:     Mouth: Mucous membranes are moist.     Pharynx: Oropharynx is clear. No oropharyngeal exudate or posterior oropharyngeal erythema.  Eyes:     General: No scleral icterus.    Extraocular Movements: Extraocular movements intact.     Conjunctiva/sclera: Conjunctivae normal.     Pupils: Pupils are equal, round, and reactive to light.  Cardiovascular:     Rate and Rhythm: Normal rate and regular rhythm.     Heart sounds: Normal heart sounds. No murmur heard. No friction rub. No gallop.   Pulmonary:     Effort: Pulmonary effort is normal. No respiratory distress.     Breath sounds: Normal breath sounds. No stridor. No wheezing, rhonchi or rales.  Chest:  Breasts:     Right: No axillary adenopathy or supraclavicular adenopathy.     Left: No axillary adenopathy or supraclavicular adenopathy.      Comments: Breast exam is deferred Abdominal:     General: There is no distension.     Palpations: Abdomen is soft. There is no  hepatomegaly, splenomegaly or mass.     Tenderness: There is no abdominal tenderness. There is no guarding.     Hernia: No hernia is present.  Musculoskeletal:     Cervical back: Normal range of motion and neck supple. No tenderness.     Right lower leg: No edema.     Left lower leg: No edema.  Lymphadenopathy:     Cervical: No cervical adenopathy.     Upper Body:     Right upper body: No supraclavicular or axillary adenopathy.     Left upper body: No supraclavicular or axillary adenopathy.  Skin:    General: Skin is warm.     Coloration: Skin is not jaundiced.     Findings: No rash.  Neurological:     Mental Status: She  is alert and oriented to person, place, and time.     Cranial Nerves: No cranial nerve deficit.  Psychiatric:        Mood and Affect: Mood normal.        Behavior: Behavior normal.    Lymph nodes:   There is no cervical, clavicular, axillary or inguinal lymphadenopathy.   LABS:   CBC Latest Ref Rng & Units 02/06/2020 01/16/2020 12/21/2019  WBC - 6.1 5.7 8.1  Hemoglobin 12.0 - 16.0 12.1 11.7(A) 12.0  Hematocrit 36 - 46 37 35(A) 37.2  Platelets 150 - 399 211 243 277   CMP Latest Ref Rng & Units 02/06/2020 01/16/2020 12/21/2019  Glucose 70 - 99 mg/dL - - 110(H)  BUN 4 - _0 Creatinine 0.5 - 1.1 1.0 1.0 0.85  Sodium 137 - 147 140 140 137  Potassium 3.4 - 5.3 3.6 3.6 3.5  Chloride 99 - 108 106 107 100  CO2 13 - 22 25(A) 24(A) 22  Calcium 8.7 - 10.7 9.9 9.7 9.7  Total Protein 6.5 - 8.1 g/dL - - 8.0  Total Bilirubin 0.3 - 1.2 mg/dL - - 0.4  Alkaline Phos 25 - 125 75 68 72  AST 13 - 35 _1 ALT 7 - 35 _2 No results found for: CEA1 / No results found for: CEA1 No results found for: PSA1 No results found for: PXT062 No results found for: IRS854  No results found for: TOTALPROTELP, ALBUMINELP, A1GS, A2GS, BETS, BETA2SER, GAMS, MSPIKE, SPEI No results found for: TIBC, FERRITIN, IRONPCTSAT No results found for: LDH  STUDIES:  No  results found.    HISTORY:   Past Medical History:  Diagnosis Date  . CAD (coronary artery disease) 09/06/2019  . Cardiomyopathy ([place]) 06/26/2019  . Chest pain on exertion 10/18/2016  . Hypertension 10/18/2016  . Malignant neoplasm of left breast ([place])   . Malignant neoplasm of left female breast ([place]) 11/23/2019  . Morbid (severe) obesity due to excess calories ([place]) 06/25/2019   Formatting of this note might be different from the original. per last BMI of 41.64 on 02/13/2018  . Tobacco use disorder 10/19/2016    Past Surgical History:  Procedure Laterality Date  . ABLATION    . TUBAL LIGATION      Family History  Problem Relation Age of Onset  . Throat cancer Mother   . Hypertension Father   . Stroke Father   . Heart disease Father     Social History:  reports that she quit smoking about 8 months ago. Her smoking use included cigarettes. She has never used smokeless tobacco. She reports previous alcohol use. She reports that she does not use drugs.The patient is alone today.  Allergies:  Allergies  Allergen Reactions  . Sulfa Antibiotics Hives    Current Medications: Current Outpatient Medications  Medication Sig Dispense Refill  . amLODipine (NORVASC) 5 MG tablet Take 5 mg by mouth daily.    Marland Kitchen aspirin EC 81 MG tablet Take 81 mg by mouth daily. Swallow whole.    Marland Kitchen atorvastatin (LIPITOR) 20 MG tablet Take 1 tablet (20 mg total) by mouth daily. 90 tablet 3  . hydrOXYzine (ATARAX/VISTARIL) 25 MG tablet Take 25 mg by mouth 4 (four) times daily as needed.    . loratadine (CLARITIN) 10 MG tablet Take 10 mg by mouth daily.    . methimazole (TAPAZOLE) 10 MG tablet Take 30 mg by mouth daily.    Marland Kitchen  nitroGLYCERIN (NITROSTAT) 0.4 MG SL tablet Place 1 tablet (0.4 mg total) under the tongue every 5 (five) minutes as needed. 25 tablet 6  . ondansetron (ZOFRAN) 4 MG tablet Take 4 mg by mouth every 4 (four) hours as needed.    Marland Kitchen oxyCODONE (OXY IR/ROXICODONE) 5 MG immediate release tablet  Take 5 mg by mouth 4 (four) times daily as needed.    Marland Kitchen oxyCODONE (OXY IR/ROXICODONE) 5 MG immediate release tablet Take 1 tablet (5 mg total) by mouth every 6 (six) hours as needed for severe pain. 30 tablet 0  . prochlorperazine (COMPAZINE) 10 MG tablet Take 10 mg by mouth every 6 (six) hours as needed.    . sacubitril-valsartan (ENTRESTO) 24-26 MG Take 1 tablet by mouth 2 (two) times daily. 60 tablet 3  . traMADol (ULTRAM) 50 MG tablet Take 1 tablet (50 mg total) by mouth every 6 (six) hours as needed (pain). 60 tablet 1  . traZODone (DESYREL) 50 MG tablet Take 50 mg by mouth at bedtime as needed.     No current facility-administered medications for this visit.     ASSESSMENT & PLAN:   Assessment:  1. 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  No primary breast lesion was found.  She was receiving neoadjuvant docetaxel/carboplatin/trastuzumab and completed 6 cycles at the end of September.  We will continue with maintenance trastuzumab for a total of 1 year which is scheduled to be completed in mid June.  She has had an excellent decrease in her left axillary lymphadenopathy.  She underwent left axillary lymph node dissection on October 14th with [name].  Surgical pathology from this procedure found tumor present in 3 of 27 lymph nodes (3/27).  One lymph node was positive for macrometastasis, 25m.  Two lymph nodes had micrometastasis.  We will plan to start her back on maintenance trastuzumab next week. 2. Cardiomyopathy with decreased left ventricular ejection fraction.  This is being managed by Dr. RGeraldo Pitter  [name] from September revealed an ejection fraction of 39%, which was stable.  However, most recent echocardiogram revealed an ejection fraction between 60-65%. She knows to report any new symptoms, such as shortness of breath, chest pain or edema. 3. Generalized pain, felt to be secondary to [name], which responded to  steroids. She no longer receives [name], but had persistent bilateral hip pain, left greater than right with positive left straight leg raise.  We need to be sure this is not related to her cancer, so recommend MRI lumbar spine and left hip.  I will send her in oxycodone 538mone every 6 hours as needed for pain while she undergoes evaluation.  She will then either follow up with her primary care provider or be referred to orthopedics for her back and hip pain. 4. Acid reflux associated with chemotherapy and steroid premedication, on esomeprazole 40 mg daily.  5. Amenorrhea since 2012.  FSH was well within the postmenopausal range.  We discontinued pre-therapy pregnancy testing. 6. Anemia, felt to be most likely secondary to chemotherapy. She has mild anemia despite completing the chemotherapy portion of her treatment.  We will continue to monitor this.   Plan:  She will proceed with a 9th cycle of maintenance trastuzumab on January 3rd.  We will plan to see her back in 3 weeks with a CBC and comprehensive metabolic panel prior to a 10th cycle. The patient understands the plans discussed today and is in agreement with  them.  She knows to contact our office if she develops issues requiring immediate clinical assessment.    Oran Dillenburg A Ymani Porcher, PA-C    Addendum: MRI left hip did not reveal any abnormality.  MRI lumbar spine was negative for metastatic disease or other acute abnormality.  There was mild to moderate facet arthropathy at L5-S1, which was worse on the right.  She will follow up with her primary care physician for further management of her pain.

## 2020-02-06 ENCOUNTER — Other Ambulatory Visit: Payer: Self-pay | Admitting: Hematology and Oncology

## 2020-02-06 ENCOUNTER — Inpatient Hospital Stay: Payer: BC Managed Care – PPO

## 2020-02-06 ENCOUNTER — Inpatient Hospital Stay (INDEPENDENT_AMBULATORY_CARE_PROVIDER_SITE_OTHER): Payer: BC Managed Care – PPO | Admitting: Hematology and Oncology

## 2020-02-06 ENCOUNTER — Inpatient Hospital Stay: Payer: BC Managed Care – PPO | Admitting: Oncology

## 2020-02-06 VITALS — BP 155/74 | HR 69 | Temp 98.2°F | Resp 18 | Ht 63.0 in | Wt 225.7 lb

## 2020-02-06 DIAGNOSIS — C50912 Malignant neoplasm of unspecified site of left female breast: Secondary | ICD-10-CM

## 2020-02-06 DIAGNOSIS — Z171 Estrogen receptor negative status [ER-]: Secondary | ICD-10-CM

## 2020-02-06 LAB — HEPATIC FUNCTION PANEL
ALT: 16 (ref 7–35)
AST: 22 (ref 13–35)
Alkaline Phosphatase: 75 (ref 25–125)
Bilirubin, Total: 0.7

## 2020-02-06 LAB — BASIC METABOLIC PANEL
BUN: 16 (ref 4–21)
CO2: 25 — AB (ref 13–22)
Chloride: 106 (ref 99–108)
Creatinine: 1 (ref 0.5–1.1)
Glucose: 98
Potassium: 3.6 (ref 3.4–5.3)
Sodium: 140 (ref 137–147)

## 2020-02-06 LAB — COMPREHENSIVE METABOLIC PANEL
Albumin: 4.3 (ref 3.5–5.0)
Calcium: 9.9 (ref 8.7–10.7)

## 2020-02-06 LAB — CBC: RBC: 4.13 (ref 3.87–5.11)

## 2020-02-06 LAB — CBC AND DIFFERENTIAL
HCT: 37 (ref 36–46)
Hemoglobin: 12.1 (ref 12.0–16.0)
Neutrophils Absolute: 3.78
Platelets: 211 (ref 150–399)
WBC: 6.1

## 2020-02-07 ENCOUNTER — Inpatient Hospital Stay: Payer: BC Managed Care – PPO

## 2020-02-07 ENCOUNTER — Encounter: Payer: Self-pay | Admitting: Hematology and Oncology

## 2020-02-11 ENCOUNTER — Other Ambulatory Visit: Payer: Self-pay

## 2020-02-11 ENCOUNTER — Inpatient Hospital Stay: Payer: BC Managed Care – PPO | Attending: Hematology and Oncology

## 2020-02-11 VITALS — BP 175/87 | HR 80 | Temp 98.0°F | Resp 18 | Ht 63.0 in | Wt 229.0 lb

## 2020-02-11 DIAGNOSIS — I10 Essential (primary) hypertension: Secondary | ICD-10-CM | POA: Diagnosis not present

## 2020-02-11 DIAGNOSIS — D649 Anemia, unspecified: Secondary | ICD-10-CM | POA: Diagnosis not present

## 2020-02-11 DIAGNOSIS — Z79899 Other long term (current) drug therapy: Secondary | ICD-10-CM | POA: Diagnosis not present

## 2020-02-11 DIAGNOSIS — I429 Cardiomyopathy, unspecified: Secondary | ICD-10-CM | POA: Insufficient documentation

## 2020-02-11 DIAGNOSIS — K219 Gastro-esophageal reflux disease without esophagitis: Secondary | ICD-10-CM | POA: Insufficient documentation

## 2020-02-11 DIAGNOSIS — I2510 Atherosclerotic heart disease of native coronary artery without angina pectoris: Secondary | ICD-10-CM | POA: Diagnosis not present

## 2020-02-11 DIAGNOSIS — Y842 Radiological procedure and radiotherapy as the cause of abnormal reaction of the patient, or of later complication, without mention of misadventure at the time of the procedure: Secondary | ICD-10-CM | POA: Insufficient documentation

## 2020-02-11 DIAGNOSIS — C50912 Malignant neoplasm of unspecified site of left female breast: Secondary | ICD-10-CM

## 2020-02-11 DIAGNOSIS — Z5112 Encounter for antineoplastic immunotherapy: Secondary | ICD-10-CM | POA: Insufficient documentation

## 2020-02-11 DIAGNOSIS — Z171 Estrogen receptor negative status [ER-]: Secondary | ICD-10-CM | POA: Diagnosis not present

## 2020-02-11 DIAGNOSIS — Z7982 Long term (current) use of aspirin: Secondary | ICD-10-CM | POA: Diagnosis not present

## 2020-02-11 DIAGNOSIS — C50412 Malignant neoplasm of upper-outer quadrant of left female breast: Secondary | ICD-10-CM | POA: Insufficient documentation

## 2020-02-11 MED ORDER — ACETAMINOPHEN 325 MG PO TABS: 650.0000 mg | ORAL_TABLET | Freq: Once | ORAL | Status: DC

## 2020-02-11 MED ORDER — TRASTUZUMAB-DKST CHEMO 150 MG IV SOLR
6.0000 mg/kg | Freq: Once | INTRAVENOUS | Status: AC
  Administered 2020-02-11: 609 mg via INTRAVENOUS
  Filled 2020-02-11: qty 29

## 2020-02-11 MED ORDER — SODIUM CHLORIDE 0.9 % IV SOLN
Freq: Once | INTRAVENOUS | Status: AC
  Filled 2020-02-11: qty 250

## 2020-02-11 MED ORDER — HEPARIN SOD (PORK) LOCK FLUSH 100 UNIT/ML IV SOLN
500.0000 [IU] | Freq: Once | INTRAVENOUS | Status: AC | PRN
  Administered 2020-02-11: 500 [IU]
  Filled 2020-02-11: qty 5

## 2020-02-11 MED ORDER — DIPHENHYDRAMINE HCL 50 MG/ML IJ SOLN: 25.0000 mg | Freq: Once | INTRAMUSCULAR | Status: DC

## 2020-02-11 NOTE — Progress Notes (Signed)
PT STABLE AT TIME OF DISCHARGE 

## 2020-02-11 NOTE — Patient Instructions (Signed)
[facility] Discharge Instructions for Patients Receiving Chemotherapy  Today you received the following chemotherapy agents Trastuzumab  To help prevent nausea and vomiting after your treatment, we encourage you to take your nausea medication as directed.   If you develop nausea and vomiting that is not controlled by your nausea medication, call the clinic.   BELOW ARE SYMPTOMS THAT SHOULD BE REPORTED IMMEDIATELY:  *FEVER GREATER THAN 100.5 F  *CHILLS WITH OR WITHOUT FEVER  NAUSEA AND VOMITING THAT IS NOT CONTROLLED WITH YOUR NAUSEA MEDICATION  *UNUSUAL SHORTNESS OF BREATH  *UNUSUAL BRUISING OR BLEEDING  TENDERNESS IN MOUTH AND THROAT WITH OR WITHOUT PRESENCE OF ULCERS  *URINARY PROBLEMS  *BOWEL PROBLEMS  UNUSUAL RASH Items with * indicate a potential emergency and should be followed up as soon as possible.  Feel free to call the clinic should you have any questions or concerns at The clinic phone number is [phone].  Please show the CHEMO ALERT CARD at check-in to the Emergency Department and triage nurse.   

## 2020-02-19 ENCOUNTER — Encounter: Payer: Self-pay | Admitting: Hematology and Oncology

## 2020-02-19 ENCOUNTER — Ambulatory Visit: Payer: BC Managed Care – PPO | Admitting: Oncology

## 2020-02-20 ENCOUNTER — Encounter: Payer: Self-pay | Admitting: Hematology and Oncology

## 2020-02-28 NOTE — Progress Notes (Incomplete)
Brandi Weaver  [address] [place],  [place]  [address] [phone]  Clinic Day:  02/28/2020  Referring physician: Marlene Lard, PA-C   CHIEF COMPLAINT:  CC: Stage IIA HER2 receptor positive left breast cancer  Current Treatment: Trastuzumab every 3 weeks.   HISTORY OF PRESENT ILLNESS:  Brandi Weaver is a 55 y.o. female with a history of stage IIA (T0 N1 M0) HER2 receptor positive left breast cancer diagnosed in April 2021.  The patient felt a mass in the left axilla in March and reported this to her primary care provider.  Her last mammogram had been in November 2019 with a diagnostic left mammogram and ultrasound in December.  There was a probable inframammary lymph node over the upper outer quadrant.  Six-month follow-up was recommended.  Chest ultrasound in March revealed multiple hypoechoic lesions of the left axilla with the largest up to 4.6 cm in diameter with another measuring 3.3 cm and another 2.6 cm.  The changes were felt possibly represent lymphadenitis.  Bilateral diagnostic mammogram at [facility] on March 30th revealed at least 7 enlarged intramammary and axillary lymph nodes.  No primary breast lesion was found.  Targeted left breast ultrasound revealed an increase in the previously demonstrated 7 mm intramammary node at 1:30 o'clock, on the left breast, 9 cm from the nipple, measuring 13 mm with diffuse cortical thickening.  There were multiple enlarged left axillary nodes with eccentric cortical thickening with a maximum thickness of 19.8 mm.  Ultrasound-guided biopsy of the left axillary lymph node in April confirmed metastatic carcinoma consistent with breast primary.  GATA 3 was positive with negative thyroglobulin, negative Napsin A and TTF1, and the PAX8 was equivocal.  Estrogen and progesterone receptors were negative and HER 2 positive.  Ki 67 was 40%.  Staging CT chest, abdomen and pelvis revealed bulky left axillary  lymphadenopathy extending to the sub pectoralis nodal station with a 6 mm right upper paratracheal lymph node.  There was no evidence of metastatic disease in the lungs, abdomen or pelvis.  Breast MRI revealed a 1 x 1.2 oval enhancing mass within the posterior upper outer left breast felt to be an intraparenchymal lymph node, no breast masses were seen.  Re-demonstration of the left axillary adenopathy.  On examination the left axillary adenopathy measured approximately 9 x 7 cm.  She had uterine ablation in 2011 for uterine fibroids, then experienced 1 day of uterine bleeding in 2012, but denies further bleeding.   We recommended neoadjuvant chemotherapy to include TCHP x 6 cycles with trastuzumab and pertuzumab for one full year.  Unfortunately, echocardiogram on May 4th revealed ejection fraction between 40 and 45%.  This was confirmed with [name] on May 10th, which revealed an ejection fraction of 42%.  Based on these findings, we changed the treatment regimen to docetaxel/carboplatin/trastuzumab [facility]) and eliminated pertuzumab, due to the potential cardiotoxicities from her to targeted therapies.  We also recommended repeat cardiac imaging every 6 weeks and instead of the standard every 12 weeks.  She was referred to [name] and he started her on Entresto and has been monitoring her cardiac function.  She reported some chest tightness to him, so he recommended a [name].  She had quit smoking.  She received her 1st cycle of docetaxel, carboplatin and trastuzumab on May 14th.  She has some toxicities of decreased appetite, minimal nausea and vomiting, and generalized arthralgias.  Her biggest complaint was generalized achiness and hip pain, felt  to be most likely due to [name].  Her pain was not controlled with hydrocodone/APAP 5/325 or 10/325, so she was given oxycodone 5 mg 1-2 tablets every 6 hours as needed for pain.  She was placed on esomeprazole 40 mg daily for acid reflux.  CT imaging  in May revealed bulky left axillary lymphadenopathy, which extends to the sub pectoralis nodal station, as well as a small right upper paratracheal lymph node.  No internal mammary or central thoracic metastasis identified.  No evidence of metastatic disease in the abdomen or pelvis.  She continues Entresto daily without difficulty.  She underwent nuclear stress test at [name] office on June 8th, which revealed a fairly stable ejection fraction of 39%.  She was also recommended for CT coronary angiography, which is to be scheduled at [facility].  She underwent a coronary/cardiac CT on July 8th revealed moderate CAD in proximal LAD and mild CAD in mid left main coronary artery, CADRADS = 3, coronary calcium score is 168, which places the patient in the 45 percentile for age and dex matched control, normal coronary origin with right dominance and mildly dilated main pulmonary artery, 30 mm. [name] from August 4th revealed a left ventricular ejection fraction of 39%, previously 42%in May, which was discussed with [name].  We discussed the risks and benefits with the patient again.  Overall, we feel that the benefit outweighs any further risk, as long as she is closely monitored.  Bilateral breast MRI from September 20th revealed smaller left axillary lymph nodes consistent with interval treatment.  However, there is a persistent but smaller circumscribed oval mass in the upper outer quadrant of the left breast.  We discussed this with [name] of breast radiology and we suspect this could represent the breast primary, but biopsy was negative.  [name] imaging from the same day revealed a left ventricular fraction is 39%, stable.  She quit smoking in April 2021.   Ultrasound guided left breast core needle biopsy in September revealed lymphoid tissue with reactive lymphoid hyperplasia and was negative for granulomas or malignancy.  No metastatic breast carcinoma could be seen.  She then underwent left  axillary lymph node dissection on October 14th with [name].  Surgical pathology from this procedure found tumor present in 3 of 27 lymph nodes (3/27).  One lymph node was positive for macrometastasis, 42m.  Two lymph nodes had micrometastasis.  There were no lymph nodes with isolated tumor cells and no extranodal tumor present. Echocardiogram from November 4th revealed a normal ejection fraction between 60-65%. She was referred to physical therapy for range of motion after her surgery. She started maintenance trastuzumab on November 19th.  She continued to report persistent severe left hip pain, with mild pain in the right hip.  Previous left hip x-ray did not reveal any abnormality.  CT imaging in May also did not reveal any abnormality in the hips or lumbar spine.  She has not received chemotherapy or [name] since September, so we did not feel her pain can be attributed to this. MRI left hip did not reveal any abnormality.  MRI lumbar spine was negative for metastatic disease or other acute abnormality.  There was mild to moderate facet arthropathy at L5-S1, which was worse on the right.  INTERVAL HISTORY:  LMiyaniis here today prior to a 9th cycle of maintenance trastuzumab.  She had her MRI left hip and lumbar spine this morning, but we do not have the results.  Unfortunately,  she is in severe pain at this time, as she did not bring her pain medication with her.  She will be able to take this as soon as she arrives home.  She denies any changes in her breasts.  He denies palpable adenopathy. She denies fevers or chills. She  denies pain. Her appetite is good. Her weight has increased 7 pounds over last 3 weeks.  Brandi Weaver is here for follow up prior to a 10th cycle of maintenance trastuzumab.   Her  appetite is good, and she has gained/lost _ pounds since her last visit.  She denies fever, chills or other signs of infection.  She denies nausea, vomiting, bowel issues, or abdominal pain.  She denies  sore throat, cough, dyspnea, or chest pain.  REVIEW OF SYSTEMS:  Review of Systems - Oncology   VITALS:  There were no vitals taken for this visit.  Wt Readings from Last 3 Encounters:  02/11/20 229 lb (103.9 kg)  02/06/20 225 lb 11.2 oz (102.4 kg)  01/17/20 224 lb 9 oz (101.9 kg)    There is no height or weight on file to calculate BMI.  Performance status (ECOG): 1 - Symptomatic but completely ambulatory  PHYSICAL EXAM:  Physical Exam Lymph nodes:   There is no cervical, clavicular, axillary or inguinal lymphadenopathy.   LABS:   CBC Latest Ref Rng & Units 02/06/2020 01/16/2020 12/21/2019  WBC - 6.1 5.7 8.1  Hemoglobin 12.0 - 16.0 12.1 11.7(A) 12.0  Hematocrit 36 - 46 37 35(A) 37.2  Platelets 150 - 399 211 243 277   CMP Latest Ref Rng & Units 02/06/2020 01/16/2020 12/21/2019  Glucose 70 - 99 mg/dL - - 110(H)  BUN 4 - '21 16 14 15  ' Creatinine 0.5 - 1.1 1.0 1.0 0.85  Sodium 137 - 147 140 140 137  Potassium 3.4 - 5.3 3.6 3.6 3.5  Chloride 99 - 108 106 107 100  CO2 13 - 22 25(A) 24(A) 22  Calcium 8.7 - 10.7 9.9 9.7 9.7  Total Protein 6.5 - 8.1 g/dL - - 8.0  Total Bilirubin 0.3 - 1.2 mg/dL - - 0.4  Alkaline Phos 25 - 125 75 68 72  AST 13 - 35 '22 28 19  ' ALT 7 - 35 '16 16 14     ' No results found for: CEA1 / No results found for: CEA1 No results found for: PSA1 No results found for: ZVG715 No results found for: NBZ967  No results found for: TOTALPROTELP, ALBUMINELP, A1GS, A2GS, BETS, BETA2SER, GAMS, MSPIKE, SPEI No results found for: TIBC, FERRITIN, IRONPCTSAT No results found for: LDH  STUDIES:  No results found.    HISTORY:   Allergies:  Allergies  Allergen Reactions  . Sulfa Antibiotics Hives    Current Medications: Current Outpatient Medications  Medication Sig Dispense Refill  . amLODipine (NORVASC) 5 MG tablet Take 5 mg by mouth daily.    Marland Kitchen aspirin EC 81 MG tablet Take 81 mg by mouth daily. Swallow whole.    Marland Kitchen atorvastatin (LIPITOR) 20 MG tablet  Take 1 tablet (20 mg total) by mouth daily. 90 tablet 3  . hydrOXYzine (ATARAX/VISTARIL) 25 MG tablet Take 25 mg by mouth 4 (four) times daily as needed.    . loratadine (CLARITIN) 10 MG tablet Take 10 mg by mouth daily.    . methimazole (TAPAZOLE) 10 MG tablet Take 30 mg by mouth daily.    . nitroGLYCERIN (NITROSTAT) 0.4 MG SL tablet Place 1 tablet (0.4 mg total) under the  tongue every 5 (five) minutes as needed. 25 tablet 6  . ondansetron (ZOFRAN) 4 MG tablet Take 4 mg by mouth every 4 (four) hours as needed.    Marland Kitchen oxyCODONE (OXY IR/ROXICODONE) 5 MG immediate release tablet Take 5 mg by mouth 4 (four) times daily as needed.    Marland Kitchen oxyCODONE (OXY IR/ROXICODONE) 5 MG immediate release tablet Take 1 tablet (5 mg total) by mouth every 6 (six) hours as needed for severe pain. 30 tablet 0  . prochlorperazine (COMPAZINE) 10 MG tablet Take 10 mg by mouth every 6 (six) hours as needed.    . sacubitril-valsartan (ENTRESTO) 24-26 MG Take 1 tablet by mouth 2 (two) times daily. 60 tablet 3  . traMADol (ULTRAM) 50 MG tablet Take 1 tablet (50 mg total) by mouth every 6 (six) hours as needed (pain). 60 tablet 1  . traZODone (DESYREL) 50 MG tablet Take 50 mg by mouth at bedtime as needed.     No current facility-administered medications for this visit.     ASSESSMENT & PLAN:   Assessment:  1. 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  No primary breast lesion was found.  She was receiving neoadjuvant docetaxel/carboplatin/trastuzumab and completed 6 cycles at the end of September.  We will continue with maintenance trastuzumab for a total of 1 year which is scheduled to be completed in mid June.  She has had an excellent decrease in her left axillary lymphadenopathy.  She underwent left axillary lymph node dissection on October 14th with [name].  Surgical pathology from this procedure found tumor present in 3 of 27 lymph nodes (3/27).  One lymph  node was positive for macrometastasis, 67m.  Two lymph nodes had micrometastasis.  We will plan to start her back on maintenance trastuzumab next week.  2. Cardiomyopathy with decreased left ventricular ejection fraction.  This is being managed by Dr. RGeraldo Pitter  [name] from September revealed an ejection fraction of 39%, which was stable.  However, most recent echocardiogram revealed an ejection fraction between 60-65%. She knows to report any new symptoms, such as shortness of breath, chest pain or edema.  3. Generalized pain, felt to be secondary to [name], which responded to steroids. She no longer receives [name], but had persistent bilateral hip pain, left greater than right with positive left straight leg raise.  MRI left hip did not reveal any abnormality.  MRI lumbar spine was negative for metastatic disease or other acute abnormality.  There was mild to moderate facet arthropathy at L5-S1, which was worse on the right.  I will send her in oxycodone 545mone every 6 hours as needed for pain while she undergoes evaluation.    4. Acid reflux associated with chemotherapy and steroid premedication, on esomeprazole 40 mg daily.   5. Amenorrhea since 2012.  FSH was well within the postmenopausal range.  We discontinued pre-therapy pregnancy testing.  6. Anemia, felt to be most likely secondary to chemotherapy. She has mild anemia despite completing the chemotherapy portion of her treatment.  We will continue to monitor this.   Plan:   She will proceed with a 10th cycle of maintenance trastuzumab on January 26th.  We will plan to see her back in 3 weeks with a CBC and comprehensive metabolic panel prior to a 11th cycle. The patient understands the plans discussed today and is in agreement with them.  She knows to contact our office if she develops issues requiring  immediate clinical assessment.   Derwood Kaplan, MD [facility] AT [facility] [address] [place] [address] Dept: [phone] Dept Fax: [phone]   I, Rita Ohara, am acting as scribe for Derwood Kaplan, MD  I have reviewed this report as typed by the medical scribe, and it is complete and accurate.

## 2020-02-29 ENCOUNTER — Inpatient Hospital Stay: Payer: BC Managed Care – PPO | Admitting: Oncology

## 2020-02-29 ENCOUNTER — Telehealth: Payer: Self-pay | Admitting: Hematology and Oncology

## 2020-02-29 ENCOUNTER — Inpatient Hospital Stay: Payer: BC Managed Care – PPO

## 2020-02-29 NOTE — Telephone Encounter (Signed)
Patient rescheduled from 1/21 to 1/25 Labs 10:00 am - Follow Up w/Melissa 10:30 am

## 2020-03-03 NOTE — Progress Notes (Signed)
[facility]  [address] [place],  [place]  [address] [phone]  Clinic Day:  03/04/2020  Referring physician: Marlene Lard, PA-C   CHIEF COMPLAINT:  CC: Stage IIA HER2 receptor positive left breast cancer  Current Treatment: Trastuzumab every 3 weeks.   HISTORY OF PRESENT ILLNESS:  Brandi Weaver is a 55 y.o. female with a history of stage IIA (T0 N1 M0) HER2 receptor positive left breast cancer diagnosed in April 2021.  The patient felt a mass in the left axilla in March and reported this to her primary care provider.  Her last mammogram had been in November 2019 with a diagnostic left mammogram and ultrasound in December.  There was a probable inframammary lymph node over the upper outer quadrant.  Six-month follow-up was recommended.  Chest ultrasound in March revealed multiple hypoechoic lesions of the left axilla with the largest up to 4.6 cm in diameter with another measuring 3.3 cm and another 2.6 cm.  The changes were felt possibly represent lymphadenitis.  Bilateral diagnostic mammogram at [facility] on March 30th revealed at least 7 enlarged intramammary and axillary lymph nodes.  No primary breast lesion was found.  Targeted left breast ultrasound revealed an increase in the previously demonstrated 7 mm intramammary node at 1:30 o'clock, on the left breast, 9 cm from the nipple, measuring 13 mm with diffuse cortical thickening.  There were multiple enlarged left axillary nodes with eccentric cortical thickening with a maximum thickness of 19.8 mm.  Ultrasound-guided biopsy of the left axillary lymph node in April confirmed metastatic carcinoma consistent with breast primary.  GATA 3 was positive with negative thyroglobulin, negative Napsin A and TTF1, and the PAX8 was equivocal.  Estrogen and progesterone receptors were negative and HER 2 positive.  Ki 67 was 40%.  Staging CT chest, abdomen and pelvis revealed bulky left axillary  lymphadenopathy extending to the sub pectoralis nodal station with a 6 mm right upper paratracheal lymph node.  There was no evidence of metastatic disease in the lungs, abdomen or pelvis.  Breast MRI revealed a 1 x 1.2 oval enhancing mass within the posterior upper outer left breast felt to be an intraparenchymal lymph node, no breast masses were seen.  Re-demonstration of the left axillary adenopathy.  On examination the left axillary adenopathy measured approximately 9 x 7 cm.  She had uterine ablation in 2011 for uterine fibroids, then experienced 1 day of uterine bleeding in 2012, but denies further bleeding.   We recommended neoadjuvant chemotherapy to include TCHP x 6 cycles with trastuzumab and pertuzumab for one full year.  Unfortunately, echocardiogram on May 4th revealed ejection fraction between 40 and 45%.  This was confirmed with [name] on May 10th, which revealed an ejection fraction of 42%.  Based on these findings, we changed the treatment regimen to docetaxel/carboplatin/trastuzumab [facility]) and eliminated pertuzumab, due to the potential cardiotoxicities from her to targeted therapies.  We also recommended repeat cardiac imaging every 6 weeks and instead of the standard every 12 weeks.  She was referred to [name] and he started her on Entresto and has been monitoring her cardiac function.  She reported some chest tightness to him, so he recommended a [name].  She had quit smoking.  She received her 1st cycle of docetaxel, carboplatin and trastuzumab on May 14th.  She has some toxicities of decreased appetite, minimal nausea and vomiting, and generalized arthralgias.  Her biggest complaint was generalized achiness and hip pain, felt  to be most likely due to [name].  Her pain was not controlled with hydrocodone/APAP 5/325 or 10/325, so she was given oxycodone 5 mg 1-2 tablets every 6 hours as needed for pain.  She was placed on esomeprazole 40 mg daily for acid reflux.  CT imaging  in May revealed bulky left axillary lymphadenopathy, which extends to the sub pectoralis nodal station, as well as a small right upper paratracheal lymph node.  No internal mammary or central thoracic metastasis identified.  No evidence of metastatic disease in the abdomen or pelvis.  She continues Entresto daily without difficulty.  She underwent nuclear stress test at [name] office on June 8th, which revealed a fairly stable ejection fraction of 39%.  She was also recommended for CT coronary angiography, which is to be [facility] at [facility].  She underwent a coronary/cardiac CT on July 8th revealed moderate CAD in proximal LAD and mild CAD in mid left main coronary artery, CADRADS = 3, coronary calcium score is 168, which places the patient in the 82 percentile for age and dex matched control, normal coronary origin with right dominance and mildly dilated main pulmonary artery, 30 mm. [name] from August 4th revealed a left ventricular ejection fraction of 39%, previously 42%in May, which was discussed with [name].  We discussed the risks and benefits with the patient again.  Overall, we feel that the benefit outweighs any further risk, as long as she is closely monitored.  Bilateral breast MRI from September 20th revealed smaller left axillary lymph nodes consistent with interval treatment.  However, there is a persistent but smaller circumscribed oval mass in the upper outer quadrant of the left breast.  We discussed this with [name] of breast radiology and we suspect this could represent the breast primary, but biopsy was negative.  [name] imaging from the same day revealed a left ventricular fraction is 39%, stable.  She quit smoking in April 2021.   Ultrasound guided left breast core needle biopsy in September revealed lymphoid tissue with reactive lymphoid hyperplasia and was negative for granulomas or malignancy.  No metastatic breast carcinoma could be seen.  She then underwent left  axillary lymph node dissection on October 14th with [name].  Surgical pathology from this procedure found tumor present in 3 of 27 lymph nodes (3/27).  One lymph node was positive for macrometastasis, 72m.  Two lymph nodes had micrometastasis.  There were no lymph nodes with isolated tumor cells and no extranodal tumor present. Echocardiogram from November 4th revealed a normal ejection fraction between 60-65%. She was referred to physical therapy for range of motion after her surgery. She started maintenance trastuzumab on November 19th.  She continued to report persistent severe left hip pain, with mild pain in the right hip.  Previous left hip x-ray did not reveal any abnormality.  CT imaging in May also did not reveal any abnormality in the hips or lumbar spine.  She has not received chemotherapy or [name] since September, so we did not feel her pain can be attributed to this. I therefore recommended MRI lumbar spine and left hip for further evaluation.  There was mild-to-moderate facet arthropathy at L5-S1, which was worse on the right on MRI lumbar spine.  MRI left hip was negative.  INTERVAL HISTORY:  Brandi Weaver here today prior to a 10th cycle of maintenance trastuzumab.  She reports severe pain of the left inframammary area due to moist desquamation from her radiation.  She otherwise denies any  changes in her breasts.  She states the oxycodone that she has for her back and hip pain is effective for this pain.  She is now undergoing physical therapy for her back and hip pain, with some improvement.   She denies palpable adenopathy. She denies fevers or chills. Her appetite is good. Her weight has decreased 4 pounds over last 3 weeks.  She occasionally has difficulty sleeping due to pain.  REVIEW OF SYSTEMS:  Review of Systems  Constitutional: Negative for appetite change, chills, fatigue, fever and unexpected weight change.  HENT:   Negative for lump/mass, mouth sores and sore throat.    Respiratory: Negative for cough and shortness of breath.   Cardiovascular: Negative for chest pain and leg swelling.  Gastrointestinal: Negative for abdominal pain, constipation, diarrhea, nausea and vomiting.  Genitourinary: Negative for difficulty urinating, dysuria, frequency and hematuria.   Musculoskeletal: Positive for arthralgias and back pain. Negative for myalgias.  Skin: Positive for rash. Wound: under left breast.  Neurological: Negative for dizziness and headaches.  Hematological: Negative for adenopathy.  Psychiatric/Behavioral: Negative for depression and sleep disturbance. The patient is not nervous/anxious.      VITALS:  Blood pressure (!) 157/78, pulse 69, temperature 98.1 F (36.7 C), temperature source Oral, resp. rate 18, height '5\' 3"'  (1.6 m), weight 220 lb 12.8 oz (100.2 kg), SpO2 95 %.  Wt Readings from Last 3 Encounters:  03/04/20 220 lb 12.8 oz (100.2 kg)  02/11/20 229 lb (103.9 kg)  02/06/20 225 lb 11.2 oz (102.4 kg)    Body mass index is 39.11 kg/m.  Performance status (ECOG): 1 - Symptomatic but completely ambulatory  PHYSICAL EXAM:  Physical Exam Vitals and nursing note reviewed.  Constitutional:      General: She is not in acute distress.    Appearance: Normal appearance.  HENT:     Mouth/Throat:     Mouth: Mucous membranes are moist.     Pharynx: Oropharynx is clear. No oropharyngeal exudate or posterior oropharyngeal erythema.  Eyes:     General: No scleral icterus.    Extraocular Movements: Extraocular movements intact.     Conjunctiva/sclera: Conjunctivae normal.     Pupils: Pupils are equal, round, and reactive to light.  Cardiovascular:     Rate and Rhythm: Normal rate and regular rhythm.     Heart sounds: Normal heart sounds. No murmur heard. No friction rub. No gallop.   Pulmonary:     Effort: Pulmonary effort is normal. No respiratory distress.     Breath sounds: Normal breath sounds. No stridor. No wheezing, rhonchi or rales.   Chest:  Breasts:     Right: No axillary adenopathy or supraclavicular adenopathy.     Left: No axillary adenopathy or supraclavicular adenopathy.      Comments: There is moist desquamation under the left breast with generalized radiation skin changes. Breast exam is deferred. Abdominal:     General: There is no distension.     Palpations: Abdomen is soft. There is no hepatomegaly, splenomegaly or mass.     Tenderness: There is no abdominal tenderness. There is no guarding.     Hernia: No hernia is present.  Musculoskeletal:        General: Normal range of motion.     Cervical back: Normal range of motion and neck supple. No tenderness.     Right lower leg: No edema.     Left lower leg: No edema.  Lymphadenopathy:     Cervical: No cervical adenopathy.  Upper Body:     Right upper body: No supraclavicular or axillary adenopathy.     Left upper body: No supraclavicular or axillary adenopathy.     Lower Body: No right inguinal adenopathy. No left inguinal adenopathy.  Skin:    General: Skin is warm and dry.     Coloration: Skin is not jaundiced.     Findings: No rash.  Neurological:     Mental Status: She is alert and oriented to person, place, and time.     Cranial Nerves: No cranial nerve deficit.  Psychiatric:        Mood and Affect: Mood normal.        Behavior: Behavior normal.        Thought Content: Thought content normal.    LABS:   CBC Latest Ref Rng & Units 03/04/2020 02/06/2020 01/16/2020  WBC - 4.9 6.1 5.7  Hemoglobin 12.0 - 16.0 13.2 12.1 11.7(A)  Hematocrit 36 - 46 39 37 35(A)  Platelets 150 - 399 210 211 243   CMP Latest Ref Rng & Units 03/04/2020 02/06/2020 01/16/2020  Glucose 70 - 99 mg/dL - - -  BUN 4 - '21 14 16 14  ' Creatinine 0.5 - 1.1 0.9 1.0 1.0  Sodium 137 - 147 139 140 140  Potassium 3.4 - 5.3 3.6 3.6 3.6  Chloride 99 - 108 103 106 107  CO2 13 - 22 24(A) 25(A) 24(A)  Calcium 8.7 - 10.7 9.7 9.9 9.7  Total Protein 6.5 - 8.1 g/dL - - -  Total  Bilirubin 0.3 - 1.2 mg/dL - - -  Alkaline Phos 25 - 125 76 75 68  AST 13 - 35 '24 22 28  ' ALT 7 - 35 '17 16 16     ' No results found for: CEA1 / No results found for: CEA1 No results found for: PSA1 No results found for: CHY850 No results found for: YDX412  No results found for: TOTALPROTELP, ALBUMINELP, A1GS, A2GS, BETS, BETA2SER, GAMS, MSPIKE, SPEI No results found for: TIBC, FERRITIN, IRONPCTSAT No results found for: LDH  STUDIES:  No results found.    HISTORY:   Past Medical History:  Diagnosis Date  . CAD (coronary artery disease) 09/06/2019  . Cardiomyopathy ([place]) 06/26/2019  . Chest pain on exertion 10/18/2016  . Hypertension 10/18/2016  . Malignant neoplasm of left breast ([place])   . Malignant neoplasm of left female breast ([place]) 11/23/2019  . Morbid (severe) obesity due to excess calories ([place]) 06/25/2019   Formatting of this note might be different from the original. per last BMI of 41.64 on 02/13/2018  . Tobacco use disorder 10/19/2016    Past Surgical History:  Procedure Laterality Date  . ABLATION    . TUBAL LIGATION      Family History  Problem Relation Age of Onset  . Throat cancer Mother   . Hypertension Father   . Stroke Father   . Heart disease Father     Social History:  reports that she quit smoking about 8 months ago. Her smoking use included cigarettes. She has never used smokeless tobacco. She reports previous alcohol use. She reports that she does not use drugs.The patient is alone today.  Allergies:  Allergies  Allergen Reactions  . Sulfa Antibiotics Hives    Current Medications: Current Outpatient Medications  Medication Sig Dispense Refill  . amLODipine (NORVASC) 5 MG tablet Take 5 mg by mouth daily.    Marland Kitchen aspirin EC 81 MG tablet Take 81 mg by mouth daily.  Swallow whole.    Marland Kitchen atorvastatin (LIPITOR) 20 MG tablet Take 1 tablet (20 mg total) by mouth daily. 90 tablet 3  . hydrOXYzine (ATARAX/VISTARIL) 25 MG tablet Take 25 mg by mouth 4 (four)  times daily as needed.    . loratadine (CLARITIN) 10 MG tablet Take 10 mg by mouth daily.    . methimazole (TAPAZOLE) 10 MG tablet Take 30 mg by mouth daily.    . nitroGLYCERIN (NITROSTAT) 0.4 MG SL tablet Place 1 tablet (0.4 mg total) under the tongue every 5 (five) minutes as needed. 25 tablet 6  . ondansetron (ZOFRAN) 4 MG tablet Take 4 mg by mouth every 4 (four) hours as needed.    Marland Kitchen oxyCODONE (OXY IR/ROXICODONE) 5 MG immediate release tablet Take 5 mg by mouth 4 (four) times daily as needed.    Marland Kitchen oxyCODONE (OXY IR/ROXICODONE) 5 MG immediate release tablet Take 1 tablet (5 mg total) by mouth every 6 (six) hours as needed for severe pain. 30 tablet 0  . prochlorperazine (COMPAZINE) 10 MG tablet Take 10 mg by mouth every 6 (six) hours as needed.    . sacubitril-valsartan (ENTRESTO) 24-26 MG Take 1 tablet by mouth 2 (two) times daily. 60 tablet 3  . traMADol (ULTRAM) 50 MG tablet Take 1 tablet (50 mg total) by mouth every 6 (six) hours as needed (pain). 60 tablet 1  . traZODone (DESYREL) 50 MG tablet Take 50 mg by mouth at bedtime as needed.     No current facility-administered medications for this visit.     ASSESSMENT & PLAN:   Assessment:  1. 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  No primary breast lesion was found.  She was receiving neoadjuvant docetaxel/carboplatin/trastuzumab and completed 6 cycles at the end of September.  We will continue with maintenance trastuzumab for a total of 1 year which is scheduled to be completed in mid June.  She has had an excellent decrease in her left axillary lymphadenopathy.  She underwent left axillary lymph node dissection on October 14th with [name].  Surgical pathology from this procedure found tumor present in 3 of 27 lymph nodes (3/27).  One lymph node was positive for macrometastasis, 44m.  Two lymph nodes had micrometastasis.  We will plan to start her back on maintenance  trastuzumab next week. 2. Cardiomyopathy with decreased left ventricular ejection fraction.  This is being managed by Dr. RGeraldo Pitter  [name] from September revealed an ejection fraction of 39%, which was stable.  However, most recent echocardiogram revealed an ejection fraction between 60-65%. She knows to report any new symptoms, such as shortness of breath, chest pain or edema.  She sees Dr. RGeraldo Pitteragain next week. 3. Generalized pain, felt to be secondary to [name], which responded to steroids. 4. Bilateral hip pain, for which she is using oxycodone 5 mg as needed.  MRI lumbar spine and left hip did not reveal any evidence of malignancy.  There  were degenerative changes in the lumbar spine at L5-S1.  She followed up with her primary care provider  And is now undergoing physical therapy. 4. Acid reflux associated with chemotherapy and steroid premedication, on esomeprazole 40 mg daily.  She is off the steroids now, so may  Able to stop S omeprazole. 5. Amenorrhea since 2012.  FSH was well within the postmenopausal range.  We discontinued pre-therapy pregnancy testing. 6. Anemia, felt to be most likely secondary to chemotherapy.   Her  hemoglobin is finally normal. 7. Moist desquamation of the left inframammary area secondary to radiation therapy.  She has met with Dr. Orlene Erm today and is using topical treatments for this.  Plan:  She will proceed with a 10th cycle of maintenance trastuzumab on January 26th.  We will plan to see her back in 3 weeks with a CBC and comprehensive metabolic panel prior to an 11th cycle. The patient understands the plans discussed today and is in agreement with them.  She knows to contact our office if she develops issues requiring immediate clinical assessment.    Celene Pippins A Kaeo Jacome, PA-C    Addendum: MRI left hip did not reveal any abnormality.  MRI lumbar spine was negative for metastatic disease or other acute abnormality.  There was mild to moderate facet arthropathy  at L5-S1, which was worse on the right.  She will follow up with her primary care physician for further management of her pain.

## 2020-03-04 ENCOUNTER — Inpatient Hospital Stay: Payer: BC Managed Care – PPO

## 2020-03-04 ENCOUNTER — Inpatient Hospital Stay (INDEPENDENT_AMBULATORY_CARE_PROVIDER_SITE_OTHER): Payer: BC Managed Care – PPO | Admitting: Hematology and Oncology

## 2020-03-04 ENCOUNTER — Encounter: Payer: Self-pay | Admitting: Hematology and Oncology

## 2020-03-04 ENCOUNTER — Telehealth: Payer: Self-pay | Admitting: Oncology

## 2020-03-04 VITALS — BP 157/78 | HR 69 | Temp 98.1°F | Resp 18 | Ht 63.0 in | Wt 220.8 lb

## 2020-03-04 DIAGNOSIS — C50012 Malignant neoplasm of nipple and areola, left female breast: Secondary | ICD-10-CM

## 2020-03-04 DIAGNOSIS — Z171 Estrogen receptor negative status [ER-]: Secondary | ICD-10-CM

## 2020-03-04 LAB — BASIC METABOLIC PANEL
BUN: 14 (ref 4–21)
CO2: 24 — AB (ref 13–22)
Chloride: 103 (ref 99–108)
Creatinine: 0.9 (ref 0.5–1.1)
Glucose: 140
Potassium: 3.6 (ref 3.4–5.3)
Sodium: 139 (ref 137–147)

## 2020-03-04 LAB — HEPATIC FUNCTION PANEL
ALT: 17 (ref 7–35)
AST: 24 (ref 13–35)
Alkaline Phosphatase: 76 (ref 25–125)
Bilirubin, Total: 0.6

## 2020-03-04 LAB — CBC AND DIFFERENTIAL
HCT: 39 (ref 36–46)
Hemoglobin: 13.2 (ref 12.0–16.0)
Neutrophils Absolute: 3.53
Platelets: 210 (ref 150–399)
WBC: 4.9

## 2020-03-04 LAB — CBC: RBC: 4.41 (ref 3.87–5.11)

## 2020-03-04 LAB — COMPREHENSIVE METABOLIC PANEL
Albumin: 4.3 (ref 3.5–5.0)
Calcium: 9.7 (ref 8.7–10.7)

## 2020-03-04 NOTE — Telephone Encounter (Signed)
03/04/20 Per los next appt sched and given to patient

## 2020-03-05 ENCOUNTER — Inpatient Hospital Stay: Payer: BC Managed Care – PPO

## 2020-03-05 ENCOUNTER — Other Ambulatory Visit: Payer: Self-pay

## 2020-03-05 VITALS — BP 149/70 | HR 73 | Temp 98.1°F | Resp 18 | Wt 226.0 lb

## 2020-03-05 DIAGNOSIS — C50412 Malignant neoplasm of upper-outer quadrant of left female breast: Secondary | ICD-10-CM | POA: Diagnosis not present

## 2020-03-05 DIAGNOSIS — C50912 Malignant neoplasm of unspecified site of left female breast: Secondary | ICD-10-CM

## 2020-03-05 MED ORDER — ACETAMINOPHEN 325 MG PO TABS
ORAL_TABLET | ORAL | Status: AC
  Filled 2020-03-05: qty 2

## 2020-03-05 MED ORDER — TRASTUZUMAB-DKST CHEMO 150 MG IV SOLR
6.0000 mg/kg | Freq: Once | INTRAVENOUS | Status: AC
  Administered 2020-03-05: 609 mg via INTRAVENOUS
  Filled 2020-03-05: qty 29

## 2020-03-05 MED ORDER — ACETAMINOPHEN 325 MG PO TABS
650.0000 mg | ORAL_TABLET | Freq: Once | ORAL | Status: AC
  Administered 2020-03-05: 650 mg via ORAL

## 2020-03-05 MED ORDER — DIPHENHYDRAMINE HCL 50 MG/ML IJ SOLN
25.0000 mg | Freq: Once | INTRAMUSCULAR | Status: AC
  Administered 2020-03-05: 25 mg via INTRAVENOUS

## 2020-03-05 MED ORDER — HEPARIN SOD (PORK) LOCK FLUSH 100 UNIT/ML IV SOLN
500.0000 [IU] | Freq: Once | INTRAVENOUS | Status: AC | PRN
  Administered 2020-03-05: 500 [IU]
  Filled 2020-03-05: qty 5

## 2020-03-05 MED ORDER — DIPHENHYDRAMINE HCL 50 MG/ML IJ SOLN
INTRAMUSCULAR | Status: AC
  Filled 2020-03-05: qty 1

## 2020-03-05 MED ORDER — SODIUM CHLORIDE 0.9 % IV SOLN
Freq: Once | INTRAVENOUS | Status: AC
Start: 2020-03-05 — End: 2020-03-05
  Filled 2020-03-05: qty 250

## 2020-03-05 NOTE — Patient Instructions (Signed)
[facility] Discharge Instructions for Patients Receiving Chemotherapy  Today you received the following chemotherapy agents Trastuzumab  To help prevent nausea and vomiting after your treatment, we encourage you to take your nausea medication.   If you develop nausea and vomiting that is not controlled by your nausea medication, call the clinic.   BELOW ARE SYMPTOMS THAT SHOULD BE REPORTED IMMEDIATELY:  *FEVER GREATER THAN 100.5 F  *CHILLS WITH OR WITHOUT FEVER  NAUSEA AND VOMITING THAT IS NOT CONTROLLED WITH YOUR NAUSEA MEDICATION  *UNUSUAL SHORTNESS OF BREATH  *UNUSUAL BRUISING OR BLEEDING  TENDERNESS IN MOUTH AND THROAT WITH OR WITHOUT PRESENCE OF ULCERS  *URINARY PROBLEMS  *BOWEL PROBLEMS  UNUSUAL RASH Items with * indicate a potential emergency and should be followed up as soon as possible.  Feel free to call the clinic should you have any questions or concerns at The clinic phone number is [phone].  Please show the CHEMO ALERT CARD at check-in to the Emergency Department and triage nurse.   

## 2020-03-07 ENCOUNTER — Other Ambulatory Visit: Payer: Self-pay

## 2020-03-11 ENCOUNTER — Ambulatory Visit: Payer: BC Managed Care – PPO | Admitting: Cardiology

## 2020-03-20 NOTE — Progress Notes (Incomplete)
[facility]  [address] [place],  [place]  [address] [phone]  Clinic Day:  03/20/2020  Referring physician: Marlene Lard, PA-C   CHIEF COMPLAINT:  CC: Stage IIA HER2 receptor positive left breast cancer  Current Treatment: Trastuzumab every 3 weeks.   HISTORY OF PRESENT ILLNESS:  Brandi Weaver is a 55 y.o. female with a history of stage IIA (T0 N1 M0) HER2 receptor positive left breast cancer diagnosed in April 2021.  The patient felt a mass in the left axilla in March and reported this to her primary care provider.  Her last mammogram had been in November 2019 with a diagnostic left mammogram and ultrasound in December.  There was a probable inframammary lymph node over the upper outer quadrant.  Six-month follow-up was recommended.  Chest ultrasound in March revealed multiple hypoechoic lesions of the left axilla with the largest up to 4.6 cm in diameter with another measuring 3.3 cm and another 2.6 cm.  The changes were felt possibly represent lymphadenitis.  Bilateral diagnostic mammogram at [facility] on March 30th revealed at least 7 enlarged intramammary and axillary lymph nodes.  No primary breast lesion was found.  Targeted left breast ultrasound revealed an increase in the previously demonstrated 7 mm intramammary node at 1:30 o'clock, on the left breast, 9 cm from the nipple, measuring 13 mm with diffuse cortical thickening.  There were multiple enlarged left axillary nodes with eccentric cortical thickening with a maximum thickness of 19.8 mm.  Ultrasound-guided biopsy of the left axillary lymph node in April confirmed metastatic carcinoma consistent with breast primary.  GATA 3 was positive with negative thyroglobulin, negative Napsin A and TTF1, and the PAX8 was equivocal.  Estrogen and progesterone receptors were negative and HER 2 positive.  Ki 67 was 40%.  Staging CT chest, abdomen and pelvis revealed bulky left axillary  lymphadenopathy extending to the sub pectoralis nodal station with a 6 mm right upper paratracheal lymph node.  There was no evidence of metastatic disease in the lungs, abdomen or pelvis.  Breast MRI revealed a 1 x 1.2 oval enhancing mass within the posterior upper outer left breast felt to be an intraparenchymal lymph node, no breast masses were seen.  Re-demonstration of the left axillary adenopathy.  On examination the left axillary adenopathy measured approximately 9 x 7 cm.  She had uterine ablation in 2011 for uterine fibroids, then experienced 1 day of uterine bleeding in 2012, but denies further bleeding.   We recommended neoadjuvant chemotherapy to include TCHP x 6 cycles with trastuzumab and pertuzumab for one full year.  Unfortunately, echocardiogram on May 4th revealed ejection fraction between 40 and 45%.  This was confirmed with [name] on May 10th, which revealed an ejection fraction of 42%.  Based on these findings, we changed the treatment regimen to docetaxel/carboplatin/trastuzumab [facility]) and eliminated pertuzumab, due to the potential cardiotoxicities from her to targeted therapies.  We also recommended repeat cardiac imaging every 6 weeks and instead of the standard every 12 weeks.  She was referred to [name] and he started her on Entresto and has been monitoring her cardiac function.  She reported some chest tightness to him, so he recommended a [name].  She had quit smoking.  She received her 1st cycle of docetaxel, carboplatin and trastuzumab on May 14th.  She has some toxicities of decreased appetite, minimal nausea and vomiting, and generalized arthralgias.  Her biggest complaint was generalized achiness and hip pain, felt  to be most likely due to [name].  Her pain was not controlled with hydrocodone/APAP 5/325 or 10/325, so she was given oxycodone 5 mg 1-2 tablets every 6 hours as needed for pain.  She was placed on esomeprazole 40 mg daily for acid reflux.  CT imaging  in May revealed bulky left axillary lymphadenopathy, which extends to the sub pectoralis nodal station, as well as a small right upper paratracheal lymph node.  No internal mammary or central thoracic metastasis identified.  No evidence of metastatic disease in the abdomen or pelvis.  She continues Entresto daily without difficulty.  She underwent nuclear stress test at [name] office on June 8th, which revealed a fairly stable ejection fraction of 39%.  She was also recommended for CT coronary angiography, which is to be [facility] at [facility].  She underwent a coronary/cardiac CT on July 8th revealed moderate CAD in proximal LAD and mild CAD in mid left main coronary artery, CADRADS = 3, coronary calcium score is 168, which places the patient in the 61 percentile for age and dex matched control, normal coronary origin with right dominance and mildly dilated main pulmonary artery, 30 mm. [name] from August 4th revealed a left ventricular ejection fraction of 39%, previously 42%in May, which was discussed with [name].  We discussed the risks and benefits with the patient again.  Overall, we feel that the benefit outweighs any further risk, as long as she is closely monitored.  Bilateral breast MRI from September 20th revealed smaller left axillary lymph nodes consistent with interval treatment.  However, there is a persistent but smaller circumscribed oval mass in the upper outer quadrant of the left breast.  We discussed this with [name] of breast radiology and we suspect this could represent the breast primary, but biopsy was negative.  [name] imaging from the same day revealed a left ventricular fraction is 39%, stable.  She quit smoking in April 2021.   Ultrasound guided left breast core needle biopsy in September revealed lymphoid tissue with reactive lymphoid hyperplasia and was negative for granulomas or malignancy.  No metastatic breast carcinoma could be seen.  She then underwent left  axillary lymph node dissection on October 14th with [name].  Surgical pathology from this procedure found tumor present in 3 of 27 lymph nodes (3/27).  One lymph node was positive for macrometastasis, 65m.  Two lymph nodes had micrometastasis.  There were no lymph nodes with isolated tumor cells and no extranodal tumor present. Echocardiogram from November 4th revealed a normal ejection fraction between 60-65%. She was referred to physical therapy for range of motion after her surgery. She started maintenance trastuzumab on November 19th.  She continued to report persistent severe left hip pain, with mild pain in the right hip.  Previous left hip x-ray did not reveal any abnormality.  CT imaging in May also did not reveal any abnormality in the hips or lumbar spine.  She has not received chemotherapy or [name] since September, so we did not feel her pain can be attributed to this. I therefore recommended MRI lumbar spine and left hip for further evaluation.  There was mild-to-moderate facet arthropathy at L5-S1, which was worse on the right on MRI lumbar spine.  MRI left hip was negative.  INTERVAL HISTORY:  Brandi Weaver here today prior to a 10th cycle of maintenance trastuzumab.  She reports severe pain of the left inframammary area due to moist desquamation from her radiation.  She otherwise denies any  changes in her breasts.  She states the oxycodone that she has for her back and hip pain is effective for this pain.  She is now undergoing physical therapy for her back and hip pain, with some improvement.   She denies palpable adenopathy. She denies fevers or chills. Her appetite is good. Her weight has decreased 4 pounds over last 3 weeks.  She occasionally has difficulty sleeping due to pain.  Brandi Weaver is here for routine follow up prior to an 11th cycle of maintenance trastuzumab.   Her  appetite is good, and she has gained/lost _ pounds since her last visit.  She denies fever, chills or other signs of  infection.  She denies nausea, vomiting, bowel issues, or abdominal pain.  She denies sore throat, cough, dyspnea, or chest pain.  REVIEW OF SYSTEMS:  Review of Systems - Oncology   VITALS:  There were no vitals taken for this visit.  Wt Readings from Last 3 Encounters:  03/05/20 226 lb (102.5 kg)  03/04/20 220 lb 12.8 oz (100.2 kg)  02/11/20 229 lb (103.9 kg)    There is no height or weight on file to calculate BMI.  Performance status (ECOG): 1 - Symptomatic but completely ambulatory  PHYSICAL EXAM:  Physical Exam LABS:   CBC Latest Ref Rng & Units 03/04/2020 02/06/2020 01/16/2020  WBC - 4.9 6.1 5.7  Hemoglobin 12.0 - 16.0 13.2 12.1 11.7(A)  Hematocrit 36 - 46 39 37 35(A)  Platelets 150 - 399 210 211 243   CMP Latest Ref Rng & Units 03/04/2020 02/06/2020 01/16/2020  Glucose 70 - 99 mg/dL - - -  BUN 4 - '21 14 16 14  ' Creatinine 0.5 - 1.1 0.9 1.0 1.0  Sodium 137 - 147 139 140 140  Potassium 3.4 - 5.3 3.6 3.6 3.6  Chloride 99 - 108 103 106 107  CO2 13 - 22 24(A) 25(A) 24(A)  Calcium 8.7 - 10.7 9.7 9.9 9.7  Total Protein 6.5 - 8.1 g/dL - - -  Total Bilirubin 0.3 - 1.2 mg/dL - - -  Alkaline Phos 25 - 125 76 75 68  AST 13 - 35 '24 22 28  ' ALT 7 - 35 '17 16 16     ' No results found for: CEA1 / No results found for: CEA1 No results found for: PSA1 No results found for: CHE035 No results found for: CYE185  No results found for: TOTALPROTELP, ALBUMINELP, A1GS, A2GS, BETS, BETA2SER, GAMS, MSPIKE, SPEI No results found for: TIBC, FERRITIN, IRONPCTSAT No results found for: LDH  STUDIES:  No results found.    HISTORY:   Allergies:  Allergies  Allergen Reactions  . Sulfa Antibiotics Hives    Current Medications: Current Outpatient Medications  Medication Sig Dispense Refill  . amLODipine (NORVASC) 5 MG tablet Take 5 mg by mouth daily.    Marland Kitchen aspirin EC 81 MG tablet Take 81 mg by mouth daily. Swallow whole.    Marland Kitchen atorvastatin (LIPITOR) 10 MG tablet Take 10 mg by mouth daily.     Marland Kitchen atorvastatin (LIPITOR) 20 MG tablet Take 1 tablet (20 mg total) by mouth daily. 90 tablet 3  . cetirizine (ZYRTEC) 10 MG tablet Take 10 mg by mouth daily.    . DULoxetine (CYMBALTA) 30 MG capsule Take 30 mg by mouth 2 (two) times daily.    . hydrOXYzine (ATARAX/VISTARIL) 25 MG tablet Take 25 mg by mouth 4 (four) times daily as needed.    . loratadine (CLARITIN) 10 MG tablet Take 10  mg by mouth daily.    . methimazole (TAPAZOLE) 10 MG tablet Take 30 mg by mouth daily.    . nitroGLYCERIN (NITROSTAT) 0.4 MG SL tablet Place 1 tablet (0.4 mg total) under the tongue every 5 (five) minutes as needed. 25 tablet 6  . ondansetron (ZOFRAN) 4 MG tablet Take 4 mg by mouth every 4 (four) hours as needed.    . Oxycodone HCl 10 MG TABS Take 10 mg by mouth 4 (four) times daily as needed.    . prochlorperazine (COMPAZINE) 10 MG tablet Take 10 mg by mouth every 6 (six) hours as needed.    . sacubitril-valsartan (ENTRESTO) 24-26 MG Take 1 tablet by mouth 2 (two) times daily. 60 tablet 3  . traMADol (ULTRAM) 50 MG tablet Take 1 tablet (50 mg total) by mouth every 6 (six) hours as needed (pain). 60 tablet 1  . traZODone (DESYREL) 50 MG tablet Take 50 mg by mouth at bedtime as needed.    . TYLENOL 500 MG tablet Take 1,000 mg by mouth every 8 (eight) hours as needed.     No current facility-administered medications for this visit.     ASSESSMENT & PLAN:   Assessment:  1. 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  No primary breast lesion was found.  She was receiving neoadjuvant docetaxel/carboplatin/trastuzumab and completed 6 cycles at the end of September.  We will continue with maintenance trastuzumab for a total of 1 year which is scheduled to be completed in mid June.  She has had an excellent decrease in her left axillary lymphadenopathy.  She underwent left axillary lymph node dissection on October 14th with [name].  Surgical pathology  from this procedure found tumor present in 3 of 27 lymph nodes (3/27).  One lymph node was positive for macrometastasis, 72m.  Two lymph nodes had micrometastasis.  We will plan to start her back on maintenance trastuzumab next week.  2. Cardiomyopathy with decreased left ventricular ejection fraction.  This is being managed by Dr. RGeraldo Pitter  [name] from September revealed an ejection fraction of 39%, which was stable.  However, most recent echocardiogram revealed an ejection fraction between 60-65%. She knows to report any new symptoms, such as shortness of breath, chest pain or edema.  She sees Dr. RGeraldo Pitteragain next week.  3. Generalized pain, felt to be secondary to [name], which responded to steroids.  4. Bilateral hip pain, for which she is using oxycodone 5 mg as needed.  MRI lumbar spine and left hip did not reveal any evidence of malignancy.  There  were degenerative changes in the lumbar spine at L5-S1.  She followed up with her primary care provider  and is now undergoing physical therapy.  5. Acid reflux associated with chemotherapy and steroid premedication, on esomeprazole 40 mg daily.  She is off the steroids now, so may  Able to stop S omeprazole.  6. Amenorrhea since 2012.  FSH was well within the postmenopausal range.  We discontinued pre-therapy pregnancy testing.  7. Anemia, felt to be most likely secondary to chemotherapy.   Her hemoglobin is finally normal.  8. Moist desquamation of the left inframammary area secondary to radiation therapy.  She has met with Dr. POrlene Ermtoday and is using topical treatments for this.  Plan:   She will proceed with a 11th cycle of maintenance trastuzumab on February 17th.  We will plan to see her back in 3 weeks with  a CBC and comprehensive metabolic panel prior to an 12th cycle. The patient understands the plans discussed today and is in agreement with them.  She knows to contact our office if she develops issues requiring immediate clinical  assessment.   Derwood Kaplan, MD [facility] AT [facility] [address] [place] [address] Dept: [phone] Dept Fax: [phone]   I, Rita Ohara, am acting as scribe for Derwood Kaplan, MD  I have reviewed this report as typed by the medical scribe, and it is complete and accurate.

## 2020-03-21 ENCOUNTER — Other Ambulatory Visit: Payer: Self-pay | Admitting: Oncology

## 2020-03-21 ENCOUNTER — Telehealth: Payer: Self-pay

## 2020-03-21 ENCOUNTER — Telehealth: Payer: Self-pay | Admitting: Oncology

## 2020-03-21 NOTE — Telephone Encounter (Signed)
Per Levada Dy she will call to [facility] 2/14 Labs, Follow Up w/Dr Hinton Rao - Duplicated Appt's

## 2020-03-21 NOTE — Telephone Encounter (Signed)
Notified patient that her appointment for labs and follow up with Dr. Hinton Rao will be cancelled on Monday 03/24/20 due to being scheduled to see her again on Wednesday 03/26/20 for labs and follow up. Patient is scheduled for infusion on Thursday 03/28/20.

## 2020-03-24 ENCOUNTER — Inpatient Hospital Stay: Payer: BC Managed Care – PPO | Admitting: Oncology

## 2020-03-24 ENCOUNTER — Inpatient Hospital Stay: Payer: BC Managed Care – PPO

## 2020-03-24 NOTE — Progress Notes (Signed)
[facility]  [address] [place],    [address] [phone]  Clinic Day:  03/26/2020  Referring physician: Wyline Copas Hewitt Shorts, PA-C  This document serves as a record of services personally performed by Hosie Poisson, MD. It was created on their behalf by [facility], a trained medical scribe. The creation of this record is based on the scribe's personal observations and the provider's statements to them.  CHIEF COMPLAINT:  CC: Stage IIA HER2 receptor positive left breast cancer  Current Treatment: Trastuzumab every 3 weeks.   HISTORY OF PRESENT ILLNESS:  Brandi Weaver is a 55 y.o. female with a history of stage IIA (T0 N1 M0) HER2 receptor positive left breast cancer diagnosed in April 2021.  The patient felt a mass in the left axilla in March and reported this to her primary care provider.  Her last mammogram had been in November 2019 with a diagnostic left mammogram and ultrasound in December.  There was a probable inframammary lymph node over the upper outer quadrant.  Six-month follow-up was recommended.  Chest ultrasound in March revealed multiple hypoechoic lesions of the left axilla with the largest up to 4.6 cm in diameter with another measuring 3.3 cm and another 2.6 cm.  The changes were felt possibly represent lymphadenitis.  Bilateral diagnostic mamm[facility] at [facility] on March 30th revealed at least 7 enlarged intramammary and axillary lymph nodes.  No primary breast lesion was found.  Targeted left breast ultrasound revealed an increase in the previously demonstrated 7 mm intramammary node at 1:30 o'clock, on the left breast, 9 cm from the nipple, measuring 13 mm with diffuse cortical thickening.  There were multiple enlarged left axillary nodes with eccentric cortical thickening with a maximum thickness of 19.8 mm.  Ultrasound-guided biopsy of the left axillary lymph node in April confirmed metastatic carcinoma consistent  with breast primary.  GATA 3 was positive with negative thyroglobulin, negative Napsin A and TTF1, and the PAX8 was equivocal.  Estrogen and progesterone receptors were negative and HER 2 positive.  Ki 67 was 40%.  Staging CT chest, abdomen and pelvis revealed bulky left axillary lymphadenopathy extending to the sub pectoralis nodal station with a 6 mm right upper paratracheal lymph node.  There was no evidence of metastatic disease in the lungs, abdomen or pelvis.  Breast MRI revealed a 1 x 1.2 oval enhancing mass within the posterior upper outer left breast felt to be an intraparenchymal lymph node, no breast masses were seen.  Re-demonstration of the left axillary adenopathy.  On examination the left axillary adenopathy measured approximately 9 x 7 cm.  She had uterine ablation in 2011 for uterine fibroids, then experienced 1 day of uterine bleeding in 2012, but denies further bleeding.   We recommended neoadjuvant chemotherapy to include TCHP x 6 cycles with trastuzumab and pertuzumab for one full year.  Unfortunately, echocardiogram on May 4th revealed ejection fraction between 40 and 45%.  This was confirmed with [name] on May 10th, which revealed an ejection fraction of 42%.  Based on these findings, we changed the treatment regimen to docetaxel/carboplatin/trastuzumab [facility]) and eliminated pertuzumab, due to the potential cardiotoxicities from her to targeted therapies.  We also recommended repeat cardiac imaging every 6 weeks and instead of the standard every 12 weeks.  She was referred to [name] and he started her on Entresto and has been monitoring her cardiac function.  She reported some chest tightness to him, so he recommended a [name].  She had quit smoking.  She received her 1st cycle of docetaxel, carboplatin and trastuzumab on May 14th.  She has some toxicities of decreased appetite, minimal nausea and vomiting, and generalized arthralgias.  Her biggest complaint was generalized  achiness and hip pain, felt to be most likely due to [name].  Her pain was not controlled with hydrocodone/APAP 5/325 or 10/325, so she was given oxycodone 5 mg 1-2 tablets every 6 hours as needed for pain.  She was placed on esomeprazole 40 mg daily for acid reflux.  CT imaging in May revealed bulky left axillary lymphadenopathy, which extends to the sub pectoralis nodal station, as well as a small right upper paratracheal lymph node.  No internal mammary or central thoracic metastasis identified.  No evidence of metastatic disease in the abdomen or pelvis.  She continues Entresto daily without difficulty.  She underwent nuclear stress test at [name] office on June 8th, which revealed a fairly stable ejection fraction of 39%.  She was also recommended for CT coronary angiography, which is to be scheduled at [facility].  She underwent a coronary/cardiac CT on July 8th revealed moderate CAD in proximal LAD and mild CAD in mid left main coronary artery, CADRADS = 3, coronary calcium score is 168, which places the patient in the 34 percentile for age and dex matched control, normal coronary origin with right dominance and mildly dilated main pulmonary artery, 30 mm. [name] from August 4th revealed a left ventricular ejection fraction of 39%, previously 42%in May, which was discussed with [name].  We discussed the risks and benefits with the patient again.  Overall, we feel that the benefit outweighs any further risk, as long as she is closely monitored.  Bilateral breast MRI from September 20th revealed smaller left axillary lymph nodes consistent with interval treatment.  However, there is a persistent but smaller circumscribed oval mass in the upper outer quadrant of the left breast.  We discussed this with [name] of breast radiology and we suspect this could represent the breast primary, but biopsy was negative.  [name] imaging from the same day revealed a left ventricular fraction is 39%,  stable.  She quit smoking in April 2021.   Ultrasound guided left breast core needle biopsy in September revealed lymphoid tissue with reactive lymphoid hyperplasia and was negative for granulomas or malignancy.  No metastatic breast carcinoma could be seen.  She then underwent left axillary lymph node dissection on October 14th with [name].  Surgical pathology from this procedure found tumor present in 3 of 27 lymph nodes (3/27).  One lymph node was positive for macrometastasis, 75m.  Two lymph nodes had micrometastasis.  There were no lymph nodes with isolated tumor cells and no extranodal tumor present. Echocardiogram from November 4th revealed a normal ejection fraction between 60-65%. She was referred to physical therapy for range of motion after her surgery. She started maintenance trastuzumab on November 19th.  She continued to report persistent severe left hip pain, with mild pain in the right hip.  Previous left hip x-ray did not reveal any abnormality.  CT imaging in May also did not reveal any abnormality in the hips or lumbar spine.  She has not received chemotherapy or [name] since September, so we did not feel her pain can be attributed to this. I therefore recommended MRI lumbar spine and left hip for further evaluation.  There was mild-to-moderate facet arthropathy at L5-S1, which was worse on the right on MRI lumbar spine.  MRI left hip was negative.  She is now undergoing physical therapy for her back and hip pain, with some improvement.   INTERVAL HISTORY:  Brandi Weaver is here for follow up prior to an 11th cycle of maintenance trastuzumab.  She just finished radiation to the left breast on February 8th and still has severe expected post radiation changes of the left breast.  She continues to have significant pain of the left breast, with lots of swelling and drainage.  She also notes heaviness of the left breast.  She has oxycodone 10 mg to use for her pain.  She was unable to attend her  cardiac appointment with [name] for her ECHO due to her pain.  We will reschedule this prior to her next treatment.  Blood counts are unremarkable.  Her  appetite is good, and she has gained 2 pounds since her last visit.  She denies fever, chills or other signs of infection.  She denies nausea, vomiting, bowel issues, or abdominal pain.  She denies sore throat, cough, dyspnea, or chest pain. Marland Kitchen REVIEW OF SYSTEMS:  Review of Systems  Constitutional: Negative.  Negative for appetite change, chills, fatigue and fever.  HENT:  Negative.   Eyes: Negative.   Respiratory: Negative.  Negative for chest tightness, cough, hemoptysis, shortness of breath and wheezing.   Cardiovascular: Negative.  Negative for chest pain, leg swelling and palpitations.  Gastrointestinal: Negative.  Negative for abdominal pain, blood in stool, constipation, diarrhea, nausea and vomiting.  Endocrine: Negative.   Genitourinary: Negative.    Musculoskeletal:       Severe pain and tenderness of the left breast with post radiation changes including swelling, erythema, heat and desquamation.  Skin: Negative.   Neurological: Negative.   Hematological: Negative.   Psychiatric/Behavioral: Negative.      VITALS:  Blood pressure 133/76, pulse 77, temperature 98.2 F (36.8 C), temperature source Oral, resp. rate 18, height _0  (1.6 m), weight 222 lb 11.2 oz (101 kg), SpO2 98 %.  Wt Readings from Last 3 Encounters:  03/26/20 222 lb 11.2 oz (101 kg)  03/05/20 226 lb (102.5 kg)  03/04/20 220 lb 12.8 oz (100.2 kg)    Body mass index is 39.45 kg/m.  Performance status (ECOG): 1 - Symptomatic but completely ambulatory  PHYSICAL EXAM:  Physical Exam Constitutional:      General: She is not in acute distress.    Appearance: Normal appearance. She is normal weight.  HENT:     Head: Normocephalic and atraumatic.  Eyes:     General: No scleral icterus.    Extraocular Movements: Extraocular movements intact.      Conjunctiva/sclera: Conjunctivae normal.     Pupils: Pupils are equal, round, and reactive to light.  Cardiovascular:     Rate and Rhythm: Normal rate and regular rhythm.     Pulses: Normal pulses.     Heart sounds: Normal heart sounds. No murmur heard. No friction rub. No gallop.   Pulmonary:     Effort: Pulmonary effort is normal. No respiratory distress.     Breath sounds: Normal breath sounds.  Chest:  Breasts:     Right: Normal.      Comments: She has desquamation of the clavicular area on the left.  There is severe erythema, heat and desquamation extending to the left axilla which is severely tender.  She also has severe moist desquamation of the inframammary fold Abdominal:     General: Bowel sounds are normal. There is no distension.  Palpations: Abdomen is soft. There is no mass.     Tenderness: There is no abdominal tenderness.  Musculoskeletal:        General: Normal range of motion.     Cervical back: Normal range of motion and neck supple.     Right lower leg: No edema.     Left lower leg: No edema.  Lymphadenopathy:     Cervical: No cervical adenopathy.  Skin:    General: Skin is warm and dry.  Neurological:     General: No focal deficit present.     Mental Status: She is alert and oriented to person, place, and time. Mental status is at baseline.  Psychiatric:        Mood and Affect: Mood normal.        Behavior: Behavior normal.        Thought Content: Thought content normal.        Judgment: Judgment normal.    LABS:   CBC Latest Ref Rng & Units 03/04/2020 02/06/2020 01/16/2020  WBC - 4.9 6.1 5.7  Hemoglobin 12.0 - 16.0 13.2 12.1 11.7(A)  Hematocrit 36 - 46 39 37 35(A)  Platelets 150 - 399 210 211 243   CMP Latest Ref Rng & Units 03/04/2020 02/06/2020 01/16/2020  Glucose 70 - 99 mg/dL - - -  BUN 4 - _0 Creatinine 0.5 - 1.1 0.9 1.0 1.0  Sodium 137 - 147 139 140 140  Potassium 3.4 - 5.3 3.6 3.6 3.6  Chloride 99 - 108 103 106 107  CO2 13 -  22 24(A) 25(A) 24(A)  Calcium 8.7 - 10.7 9.7 9.9 9.7  Total Protein 6.5 - 8.1 g/dL - - -  Total Bilirubin 0.3 - 1.2 mg/dL - - -  Alkaline Phos 25 - 125 76 75 68  AST 13 - 35 _1 ALT 7 - 35 _2 STUDIES:  No results found.    HISTORY:   Allergies:  Allergies  Allergen Reactions  . Sulfa Antibiotics Hives    Current Medications: Current Outpatient Medications  Medication Sig Dispense Refill  . amLODipine (NORVASC) 5 MG tablet Take 5 mg by mouth daily.    Marland Kitchen aspirin EC 81 MG tablet Take 81 mg by mouth daily. Swallow whole.    Marland Kitchen atorvastatin (LIPITOR) 10 MG tablet Take 10 mg by mouth daily.    Marland Kitchen atorvastatin (LIPITOR) 20 MG tablet Take 1 tablet (20 mg total) by mouth daily. 90 tablet 3  . cetirizine (ZYRTEC) 10 MG tablet Take 10 mg by mouth daily.    . DULoxetine (CYMBALTA) 30 MG capsule Take 30 mg by mouth 2 (two) times daily.    . hydrOXYzine (ATARAX/VISTARIL) 25 MG tablet Take 25 mg by mouth 4 (four) times daily as needed.    . loratadine (CLARITIN) 10 MG tablet Take 10 mg by mouth daily.    . methimazole (TAPAZOLE) 10 MG tablet Take 30 mg by mouth daily.    . nitroGLYCERIN (NITROSTAT) 0.4 MG SL tablet Place 1 tablet (0.4 mg total) under the tongue every 5 (five) minutes as needed. 25 tablet 6  . ondansetron (ZOFRAN) 4 MG tablet Take 4 mg by mouth every 4 (four) hours as needed.    . Oxycodone HCl 10 MG TABS Take 10 mg by mouth 4 (four) times daily as needed.    . prochlorperazine (COMPAZINE) 10 MG tablet Take 10 mg by mouth every 6 (six) hours as needed.    Marland Kitchen  sacubitril-valsartan (ENTRESTO) 24-26 MG Take 1 tablet by mouth 2 (two) times daily. 60 tablet 3  . traMADol (ULTRAM) 50 MG tablet Take 1 tablet (50 mg total) by mouth every 6 (six) hours as needed (pain). 60 tablet 1  . traZODone (DESYREL) 50 MG tablet Take 50 mg by mouth at bedtime as needed.    . TYLENOL 500 MG tablet Take 1,000 mg by mouth every 8 (eight) hours as needed.    . venlafaxine XR (EFFEXOR-XR)  37.5 MG 24 hr capsule Take 37.5 mg by mouth 2 (two) times daily.     No current facility-administered medications for this visit.     ASSESSMENT & PLAN:   Assessment:  1. 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  No primary breast lesion was found.  She was receiving neoadjuvant docetaxel/carboplatin/trastuzumab and completed 6 cycles at the end of September.  We will continue with maintenance trastuzumab for a total of 1 year which is scheduled to be completed in mid June.  She has had an excellent decrease in her left axillary lymphadenopathy.  She underwent left axillary lymph node dissection on October 14th with [name].  Surgical pathology from this procedure found tumor present in 3 of 27 lymph nodes (3/27).  One lymph node was positive for macrometastasis, 82m.  Two lymph nodes had micrometastasis.  She continues maintenance trastuzumab.  2. Cardiomyopathy with decreased left ventricular ejection fraction.  This is being managed by Dr. RGeraldo Pitter  [name] from September revealed an ejection fraction of 39%, which was stable.  However, most recent echocardiogram revealed an ejection fraction between 60-65%. She knows to report any new symptoms, such as shortness of breath, chest pain or edema.  We will reschedule repeat ECHO prior to her next treatment.  3. Bilateral hip and back pain, for which she is using oxycodone 5 mg as needed.  MRI lumbar spine and left hip did not reveal any evidence of malignancy.  There  were degenerative changes in the lumbar spine at L5-S1.  She followed up with her primary care provider  And is now undergoing physical therapy.  4. Amenorrhea since 2012.  FSH was well within the postmenopausal range.  We discontinued pre-therapy pregnancy testing.  5. Anemia, felt to be most likely secondary to chemotherapy.   Her hemoglobin is finally normal.  6. Severe moist desquamation and advanced radiation  toxicity of the left inframammary area secondary to radiation therapy.  This is significantly painful and tender.  Plan:   She completed radiation therapy on February 8th and still has significant and severe post radiation changes including pain, erythema, heat, swelling and desquamation.  Due to her pain, she was unable to attend her cardiac appointment with Dr. RGeraldo Pitterfor repeat ECHO.  At this time, she would be unable to tolerate this procedure anyway, so we will reschedule this prior to her next treatment.  I will fill out the appropriate paperwork for her employer today as she will not be able to do any strenuous activity, likely until the end of April or possibly beyond.  She will proceed with a 11th cycle of maintenance trastuzumab on February 17th.  We will plan to see her back in 3 weeks with a CBC, comprehensive metabolic panel and repeat ECHO prior to an 12th cycle. The patient understands the plans discussed today and is in agreement with them.  She knows to contact our office if she develops issues requiring immediate  clinical assessment.   I, Rita Ohara, am acting as scribe for Derwood Kaplan, MD  I have reviewed this report as typed by the medical scribe, and it is complete and accurate.

## 2020-03-26 ENCOUNTER — Other Ambulatory Visit: Payer: Self-pay | Admitting: Oncology

## 2020-03-26 ENCOUNTER — Inpatient Hospital Stay: Payer: BC Managed Care – PPO

## 2020-03-26 ENCOUNTER — Encounter: Payer: Self-pay | Admitting: Oncology

## 2020-03-26 ENCOUNTER — Inpatient Hospital Stay: Payer: BC Managed Care – PPO | Attending: Hematology and Oncology | Admitting: Oncology

## 2020-03-26 ENCOUNTER — Other Ambulatory Visit: Payer: Self-pay | Admitting: Hematology and Oncology

## 2020-03-26 ENCOUNTER — Telehealth: Payer: Self-pay | Admitting: Oncology

## 2020-03-26 ENCOUNTER — Other Ambulatory Visit: Payer: Self-pay

## 2020-03-26 VITALS — BP 133/76 | HR 77 | Temp 98.2°F | Resp 18 | Ht 63.0 in | Wt 222.7 lb

## 2020-03-26 DIAGNOSIS — Z862 Personal history of diseases of the blood and blood-forming organs and certain disorders involving the immune mechanism: Secondary | ICD-10-CM | POA: Insufficient documentation

## 2020-03-26 DIAGNOSIS — Z79891 Long term (current) use of opiate analgesic: Secondary | ICD-10-CM | POA: Insufficient documentation

## 2020-03-26 DIAGNOSIS — M47817 Spondylosis without myelopathy or radiculopathy, lumbosacral region: Secondary | ICD-10-CM | POA: Insufficient documentation

## 2020-03-26 DIAGNOSIS — I428 Other cardiomyopathies: Secondary | ICD-10-CM | POA: Insufficient documentation

## 2020-03-26 DIAGNOSIS — Z87891 Personal history of nicotine dependence: Secondary | ICD-10-CM | POA: Insufficient documentation

## 2020-03-26 DIAGNOSIS — C773 Secondary and unspecified malignant neoplasm of axilla and upper limb lymph nodes: Secondary | ICD-10-CM | POA: Insufficient documentation

## 2020-03-26 DIAGNOSIS — Z171 Estrogen receptor negative status [ER-]: Secondary | ICD-10-CM

## 2020-03-26 DIAGNOSIS — C50912 Malignant neoplasm of unspecified site of left female breast: Secondary | ICD-10-CM | POA: Diagnosis not present

## 2020-03-26 DIAGNOSIS — Z5112 Encounter for antineoplastic immunotherapy: Secondary | ICD-10-CM | POA: Insufficient documentation

## 2020-03-26 DIAGNOSIS — N644 Mastodynia: Secondary | ICD-10-CM | POA: Insufficient documentation

## 2020-03-26 DIAGNOSIS — M47816 Spondylosis without myelopathy or radiculopathy, lumbar region: Secondary | ICD-10-CM | POA: Insufficient documentation

## 2020-03-26 DIAGNOSIS — Z79899 Other long term (current) drug therapy: Secondary | ICD-10-CM | POA: Insufficient documentation

## 2020-03-26 DIAGNOSIS — Z923 Personal history of irradiation: Secondary | ICD-10-CM | POA: Insufficient documentation

## 2020-03-26 DIAGNOSIS — C50412 Malignant neoplasm of upper-outer quadrant of left female breast: Secondary | ICD-10-CM | POA: Insufficient documentation

## 2020-03-26 DIAGNOSIS — K219 Gastro-esophageal reflux disease without esophagitis: Secondary | ICD-10-CM | POA: Insufficient documentation

## 2020-03-26 DIAGNOSIS — Z170 Estrogen receptor positive status [ER+]: Secondary | ICD-10-CM | POA: Insufficient documentation

## 2020-03-26 LAB — CBC AND DIFFERENTIAL
HCT: 37 (ref 36–46)
Hemoglobin: 12.1 (ref 12.0–16.0)
Neutrophils Absolute: 3.4
Platelets: 245 (ref 150–399)
WBC: 4.6

## 2020-03-26 LAB — HEPATIC FUNCTION PANEL
ALT: 13 (ref 7–35)
AST: 34 (ref 13–35)
Alkaline Phosphatase: 75 (ref 25–125)
Bilirubin, Total: 0.5

## 2020-03-26 LAB — BASIC METABOLIC PANEL
BUN: 12 (ref 4–21)
CO2: 27 — AB (ref 13–22)
Chloride: 101 (ref 99–108)
Creatinine: 1 (ref 0.5–1.1)
Glucose: 118
Potassium: 3.9 (ref 3.4–5.3)
Sodium: 138 (ref 137–147)

## 2020-03-26 LAB — COMPREHENSIVE METABOLIC PANEL
Albumin: 4.4 (ref 3.5–5.0)
Calcium: 9.4 (ref 8.7–10.7)

## 2020-03-26 LAB — CBC: RBC: 4.13 (ref 3.87–5.11)

## 2020-03-26 NOTE — Telephone Encounter (Signed)
Per 2/16 los next appt sched and given to patient 

## 2020-03-27 ENCOUNTER — Inpatient Hospital Stay: Payer: BC Managed Care – PPO

## 2020-03-27 VITALS — BP 122/76 | HR 72 | Temp 98.2°F | Resp 18 | Ht 63.0 in | Wt 222.8 lb

## 2020-03-27 DIAGNOSIS — K219 Gastro-esophageal reflux disease without esophagitis: Secondary | ICD-10-CM | POA: Diagnosis not present

## 2020-03-27 DIAGNOSIS — N644 Mastodynia: Secondary | ICD-10-CM | POA: Diagnosis not present

## 2020-03-27 DIAGNOSIS — Z79891 Long term (current) use of opiate analgesic: Secondary | ICD-10-CM | POA: Diagnosis not present

## 2020-03-27 DIAGNOSIS — M47816 Spondylosis without myelopathy or radiculopathy, lumbar region: Secondary | ICD-10-CM | POA: Diagnosis not present

## 2020-03-27 DIAGNOSIS — Z87891 Personal history of nicotine dependence: Secondary | ICD-10-CM | POA: Diagnosis not present

## 2020-03-27 DIAGNOSIS — C50412 Malignant neoplasm of upper-outer quadrant of left female breast: Secondary | ICD-10-CM | POA: Diagnosis not present

## 2020-03-27 DIAGNOSIS — Z171 Estrogen receptor negative status [ER-]: Secondary | ICD-10-CM

## 2020-03-27 DIAGNOSIS — I428 Other cardiomyopathies: Secondary | ICD-10-CM | POA: Diagnosis not present

## 2020-03-27 DIAGNOSIS — Z79899 Other long term (current) drug therapy: Secondary | ICD-10-CM | POA: Diagnosis not present

## 2020-03-27 DIAGNOSIS — C773 Secondary and unspecified malignant neoplasm of axilla and upper limb lymph nodes: Secondary | ICD-10-CM | POA: Diagnosis not present

## 2020-03-27 DIAGNOSIS — Z170 Estrogen receptor positive status [ER+]: Secondary | ICD-10-CM | POA: Diagnosis not present

## 2020-03-27 DIAGNOSIS — Z862 Personal history of diseases of the blood and blood-forming organs and certain disorders involving the immune mechanism: Secondary | ICD-10-CM | POA: Diagnosis not present

## 2020-03-27 DIAGNOSIS — C50912 Malignant neoplasm of unspecified site of left female breast: Secondary | ICD-10-CM

## 2020-03-27 DIAGNOSIS — M47817 Spondylosis without myelopathy or radiculopathy, lumbosacral region: Secondary | ICD-10-CM | POA: Diagnosis not present

## 2020-03-27 DIAGNOSIS — Z923 Personal history of irradiation: Secondary | ICD-10-CM | POA: Diagnosis not present

## 2020-03-27 DIAGNOSIS — Z5112 Encounter for antineoplastic immunotherapy: Secondary | ICD-10-CM | POA: Diagnosis present

## 2020-03-27 MED ORDER — DIPHENHYDRAMINE HCL 50 MG/ML IJ SOLN
25.0000 mg | Freq: Once | INTRAMUSCULAR | Status: AC
  Administered 2020-03-27: 25 mg via INTRAVENOUS

## 2020-03-27 MED ORDER — TRASTUZUMAB-DKST CHEMO 150 MG IV SOLR
6.0000 mg/kg | Freq: Once | INTRAVENOUS | Status: AC
  Administered 2020-03-27: 609 mg via INTRAVENOUS
  Filled 2020-03-27: qty 29

## 2020-03-27 MED ORDER — SODIUM CHLORIDE 0.9 % IV SOLN
Freq: Once | INTRAVENOUS | Status: AC
  Filled 2020-03-27: qty 250

## 2020-03-27 MED ORDER — ACETAMINOPHEN 325 MG PO TABS
ORAL_TABLET | ORAL | Status: AC
  Filled 2020-03-27: qty 2

## 2020-03-27 MED ORDER — DIPHENHYDRAMINE HCL 50 MG/ML IJ SOLN
INTRAMUSCULAR | Status: AC
  Filled 2020-03-27: qty 1

## 2020-03-27 MED ORDER — ACETAMINOPHEN 325 MG PO TABS
650.0000 mg | ORAL_TABLET | Freq: Once | ORAL | Status: AC
  Administered 2020-03-27: 650 mg via ORAL

## 2020-03-27 MED ORDER — HEPARIN SOD (PORK) LOCK FLUSH 100 UNIT/ML IV SOLN
500.0000 [IU] | Freq: Once | INTRAVENOUS | Status: AC | PRN
  Administered 2020-03-27: 500 [IU]
  Filled 2020-03-27: qty 5

## 2020-03-27 NOTE — Patient Instructions (Signed)
[facility] Discharge Instructions for Patients Receiving Chemotherapy  Today you received the following chemotherapy agents TRASTUZUMAB  To help prevent nausea and vomiting after your treatment, we encourage you to take your nausea medication.  If you develop nausea and vomiting that is not controlled by your nausea medication, call the clinic.   BELOW ARE SYMPTOMS THAT SHOULD BE REPORTED IMMEDIATELY:  *FEVER GREATER THAN 100.5 F  *CHILLS WITH OR WITHOUT FEVER  NAUSEA AND VOMITING THAT IS NOT CONTROLLED WITH YOUR NAUSEA MEDICATION  *UNUSUAL SHORTNESS OF BREATH  *UNUSUAL BRUISING OR BLEEDING  TENDERNESS IN MOUTH AND THROAT WITH OR WITHOUT PRESENCE OF ULCERS  *URINARY PROBLEMS  *BOWEL PROBLEMS  UNUSUAL RASH Items with * indicate a potential emergency and should be followed up as soon as possible.  Feel free to call the clinic should you have any questions or concerns at The clinic phone number is [phone].  Please show the CHEMO ALERT CARD at check-in to the Emergency Department and triage nurse.   

## 2020-04-14 ENCOUNTER — Ambulatory Visit: Payer: BC Managed Care – PPO | Admitting: Hematology and Oncology

## 2020-04-14 ENCOUNTER — Other Ambulatory Visit: Payer: BC Managed Care – PPO

## 2020-04-16 ENCOUNTER — Encounter: Payer: Self-pay | Admitting: Hematology and Oncology

## 2020-04-16 ENCOUNTER — Other Ambulatory Visit: Payer: Self-pay

## 2020-04-16 ENCOUNTER — Inpatient Hospital Stay: Payer: BC Managed Care – PPO | Attending: Hematology and Oncology | Admitting: Hematology and Oncology

## 2020-04-16 ENCOUNTER — Inpatient Hospital Stay: Payer: BC Managed Care – PPO

## 2020-04-16 ENCOUNTER — Other Ambulatory Visit: Payer: Self-pay | Admitting: Pharmacist

## 2020-04-16 ENCOUNTER — Telehealth: Payer: Self-pay | Admitting: Oncology

## 2020-04-16 VITALS — BP 147/73 | HR 85 | Temp 98.7°F | Resp 16 | Ht 63.0 in | Wt 226.2 lb

## 2020-04-16 DIAGNOSIS — Z171 Estrogen receptor negative status [ER-]: Secondary | ICD-10-CM

## 2020-04-16 DIAGNOSIS — C50912 Malignant neoplasm of unspecified site of left female breast: Secondary | ICD-10-CM | POA: Diagnosis not present

## 2020-04-16 DIAGNOSIS — C50412 Malignant neoplasm of upper-outer quadrant of left female breast: Secondary | ICD-10-CM | POA: Insufficient documentation

## 2020-04-16 DIAGNOSIS — Z801 Family history of malignant neoplasm of trachea, bronchus and lung: Secondary | ICD-10-CM | POA: Insufficient documentation

## 2020-04-16 DIAGNOSIS — Z170 Estrogen receptor positive status [ER+]: Secondary | ICD-10-CM | POA: Insufficient documentation

## 2020-04-16 DIAGNOSIS — Z8249 Family history of ischemic heart disease and other diseases of the circulatory system: Secondary | ICD-10-CM | POA: Insufficient documentation

## 2020-04-16 DIAGNOSIS — Z7982 Long term (current) use of aspirin: Secondary | ICD-10-CM | POA: Insufficient documentation

## 2020-04-16 DIAGNOSIS — Z87891 Personal history of nicotine dependence: Secondary | ICD-10-CM | POA: Insufficient documentation

## 2020-04-16 DIAGNOSIS — I10 Essential (primary) hypertension: Secondary | ICD-10-CM | POA: Insufficient documentation

## 2020-04-16 DIAGNOSIS — I429 Cardiomyopathy, unspecified: Secondary | ICD-10-CM | POA: Insufficient documentation

## 2020-04-16 DIAGNOSIS — Z79899 Other long term (current) drug therapy: Secondary | ICD-10-CM | POA: Insufficient documentation

## 2020-04-16 DIAGNOSIS — C773 Secondary and unspecified malignant neoplasm of axilla and upper limb lymph nodes: Secondary | ICD-10-CM | POA: Insufficient documentation

## 2020-04-16 DIAGNOSIS — Z5112 Encounter for antineoplastic immunotherapy: Secondary | ICD-10-CM | POA: Insufficient documentation

## 2020-04-16 LAB — BASIC METABOLIC PANEL
BUN: 14 (ref 4–21)
CO2: 29 — AB (ref 13–22)
Chloride: 105 (ref 99–108)
Creatinine: 1 (ref 0.5–1.1)
Glucose: 105
Potassium: 3.5 (ref 3.4–5.3)
Sodium: 140 (ref 137–147)

## 2020-04-16 LAB — CBC: RBC: 4.26 (ref 3.87–5.11)

## 2020-04-16 LAB — CBC AND DIFFERENTIAL
HCT: 38 (ref 36–46)
Hemoglobin: 12.4 (ref 12.0–16.0)
Neutrophils Absolute: 3.43
Platelets: 225 (ref 150–399)
WBC: 4.7

## 2020-04-16 LAB — HEPATIC FUNCTION PANEL
ALT: 16 (ref 7–35)
AST: 25 (ref 13–35)
Alkaline Phosphatase: 81 (ref 25–125)
Bilirubin, Total: 0.5

## 2020-04-16 LAB — COMPREHENSIVE METABOLIC PANEL
Albumin: 4.4 (ref 3.5–5.0)
Calcium: 9.1 (ref 8.7–10.7)

## 2020-04-16 NOTE — Progress Notes (Unsigned)
Per [facility], no PA required for CPT 93306 (ECHO)

## 2020-04-16 NOTE — Progress Notes (Signed)
[facility]  [address] [place],  [place]  [address] [phone]  Clinic Day:  04/16/2020  Referring physician: Harold Barban, PA-C   CHIEF COMPLAINT:  CC:   Stage IIA HER2 receptor positive breast cancer  Current Treatment:  Maintenance trastuzumab   HISTORY OF PRESENT ILLNESS:  Brandi Weaver is a 55 y.o. female with stage IIA (T0 N1 M0) HER2 receptor positive left breast cancer diagnosed in April 2021. The patient felt a mass in the left axilla in March and reported this to her primary care provider. Her last bilateral screening mammogram had been in November 2019 with a diagnostic left mammogram and ultrasound in December which revealed probable inframammary lymph node over the upper outer quadrant. Six-month follow-up was recommended. Ultrasound in March 2021 revealed multiple hypoechoic lesions of the left axilla with the largest up to 4.6 cm in diameter with another measuring 3.3 cm and another 2.6 cm. The changes were felt possibly represent lymphadenitis. Bilateral diagnostic mammogram at [facility] March revealed at least 7 enlarged intramammary and axillary lymph nodes. No primary breast lesion was found. Targeted left breast ultrasound revealed an increase in the previously demonstrated 7 mm intramammary node at 1:30 o'clock measured 13 mm with diffuse cortical thickening. There were multiple enlarged left axillary nodes with eccentric cortical thickening with a maximum thickness of 19.8 mm. Ultrasound-guided biopsy of the left axillary lymph node in April revealed metastatic carcinoma consistent with breast primary. GATA 3 was positive with negative thyroglobulin, negative Napsin A and TTF1, and the PAX8 was equivocal. Estrogen and progesterone receptors werenegative and HER 2 positive. Ki 67 was 40%. Staging CT chest, abdomen and pelvis revealed bulky left axillary lymphadenopathy extending to the sub pectoralis nodal  station with a 6 mm right upper paratracheal lymph node. There was no evidence of metastatic disease in the lungs, abdomen or pelvis. Breast MRI revealed a 1 x 1.2 oval enhancing mass within the posterior upper outer left breast felt to be an intraparenchymal lymph node, no breast masses were seen.  There was re-demonstration of the left axillary adenopathy. On physical examination the left axillary adenopathy measured approximately 9 x 7 cm. She had uterine ablation in 2011 for uterine fibroids, then experienced 1 day of uterine bleeding in 2012, but denies further bleeding.   We recommended neoadjuvant chemotherapy to include TCHP x 6 cycles with trastuzumab and pertuzumab for one full year. Unfortunately, echocardiogram on May 4th revealed ejection fraction between 40 and 45%. This was confirmed with [name] on May 10th, which revealed an ejection fraction of 42%. Based on these findings, we changed the treatment regimen to docetaxel/carboplatin/trastuzumab [facility]) and eliminated pertuzumab, due to the potential cardiotoxicities from her to targeted therapies. We also recommended repeat cardiac imaging every 6 weeks and instead of the standard every 12 weeks. She was referred to Dr. Tomie China and he started her on Entresto and has been monitoring her cardiac function. She reported some chest tightness to him, so he recommended a [name]. She had quit smoking. She received her 1st cycle of docetaxel, carboplatin and trastuzumab on May 14th. She has some toxicities of decreased appetite, minimal nausea and vomiting, and generalized arthralgias. Her biggest complaint was generalized achiness and hip pain, felt to be most likely due to [name]. Her pain was not controlled with hydrocodone/APAP 5/325 or 10/325, so she was given oxycodone 5 mg 1-2 tablets every 6 hours as needed for pain. She was placed on esomeprazole 40 mg  daily for acid reflux. CT imaging in May revealed bulky left axillary  lymphadenopathy, which extends to the sub pectoralis nodal station, as well as a small right upper paratracheal lymph node. No internal mammary or central thoracic metastasis identified. No evidence of metastatic disease in the abdomen or pelvis. She continues Entresto daily without difficulty. She underwent nuclear stress test at [name] office on June 8th, which revealed a fairly stable ejection fraction of 39%. She was also recommended for CT coronary angiography, which is to be scheduled at [facility]. She underwent a coronary/cardiac CT on July 8th revealed moderate CAD in proximal LAD and mild CAD in mid left main coronary artery, CADRADS = 3, coronary calcium score is 168, which places the patient in the 81 percentile for age and dex matched control, normal coronary origin with right dominance and mildly dilated main pulmonary artery, 30 mm. [name] from August 4th revealed a left ventricular ejection fraction of 39%, previously 42%in May, which was discussed with [name]. We discussed the risks and benefits with the patient again. Overall, we feel that the benefit outweighs any further risk, as long as she is closely monitored. Bilateral breast MRI from September 20th revealed smaller left axillary lymph nodes consistent with interval treatment. However, there is a persistent but smaller circumscribed oval mass in the upper outer quadrant of the left breast. We discussed this with [name] of breast radiology and we suspect this could represent the breast primary, but biopsy was negative. [name] imaging from the same day revealed a left ventricular fraction is 39%, stable. She quit smoking in April 2021.   Ultrasound guided left breast core needle biopsyinSeptember revealed lymphoid tissue with reactive lymphoid hyperplasia and was negative for granulomas or malignancy. No metastatic breast carcinoma could be seen. She then underwent left axillary lymph node dissection on  October 14th with [name]. Surgical pathology from this procedure found tumor present in 3 of 27 lymph nodes (3/27). One lymph node was positive for macrometastasis measuring 34m. Two lymph nodes had micrometastasis. There were no lymph nodes with isolated tumor cells and no extranodal tumor seen.Echocardiogramfrom November 4th revealed a normal ejection fraction between 60-65%.She was referred to physical therapy for range of motion after her surgery. She startedmaintenance trastuzumab on November 19th.  She continued to report persistent severe left hippain, with mild pain in the right hip. Previous left hip x-ray did not reveal any abnormality.CT imaging in May also did not reveal any abnormality in the hips or lumbar spine.She has not received chemotherapy or [name] since September, so we did not feel her pain can be attributed to this.   MRI lumbar spine and left hip for further evaluation.  There was mild-to-moderate facet arthropathy at L5-S1, which was worse on the right on MRI lumbar spine.  MRI left hip was negative. Her primary care provider referred her for physical therapy.  She has continued trastuzumab every 3 weeks.  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   INTERVAL HISTORY:  LBrittenis here today for repeat clinical assessment prior to a 12th cycle of trastuzumab. Unfortunately, her echocardiogram did not get rescheduled prior to her visit today.  She denies dyspnea, chest pain or lower extremity edema.  She states that the radiation dermatitis is healing well and she denies other changes in her breasts. She denies fevers or chills. She continues to report lower back pain. Physical therapy was placed on hold  due to her radiation dermatitis.  She plans to resume physical therapy soon. Her appetite is good. Her weight has increased 4 pounds over last 3 weeks.  REVIEW OF SYSTEMS:  Review of Systems   Constitutional: Negative for appetite change, chills, fatigue, fever and unexpected weight change.  HENT:   Negative for lump/mass, mouth sores and sore throat.   Respiratory: Negative for cough and shortness of breath.   Cardiovascular: Negative for chest pain and leg swelling.  Gastrointestinal: Negative for abdominal pain, constipation, diarrhea, nausea and vomiting.  Genitourinary: Negative for difficulty urinating, dysuria, frequency and hematuria.   Musculoskeletal: Negative for arthralgias, back pain and myalgias.  Skin: Negative for rash.  Neurological: Negative for dizziness and headaches.  Psychiatric/Behavioral: Negative for depression and sleep disturbance. The patient is not nervous/anxious.      VITALS:  Blood pressure (!) 147/73, pulse 85, temperature 98.7 F (37.1 C), resp. rate 16, height $RemoveBe'5\' 3"'PjJJQetIo$  (1.6 m), weight 226 lb 3.2 oz (102.6 kg), SpO2 98 %.  Wt Readings from Last 3 Encounters:  04/16/20 226 lb 3.2 oz (102.6 kg)  03/27/20 222 lb 12 oz (101 kg)  03/26/20 222 lb 11.2 oz (101 kg)    Body mass index is 40.07 kg/m.  Performance status (ECOG): 1 - Symptomatic but completely ambulatory  PHYSICAL EXAM:  Physical Exam Vitals and nursing note reviewed.  Constitutional:      General: She is not in acute distress.    Appearance: Normal appearance.  HENT:     Mouth/Throat:     Mouth: Mucous membranes are moist.     Pharynx: Oropharynx is clear. No oropharyngeal exudate or posterior oropharyngeal erythema.  Eyes:     General: No scleral icterus.    Extraocular Movements: Extraocular movements intact.     Conjunctiva/sclera: Conjunctivae normal.     Pupils: Pupils are equal, round, and reactive to light.  Cardiovascular:     Rate and Rhythm: Normal rate and regular rhythm.     Heart sounds: Normal heart sounds. No murmur heard. No friction rub. No gallop.   Pulmonary:     Effort: Pulmonary effort is normal.     Breath sounds: Normal breath sounds. No wheezing,  rhonchi or rales.  Chest:  Breasts:     Right: Normal. No swelling, bleeding, inverted nipple, mass, nipple discharge, skin change, axillary adenopathy or supraclavicular adenopathy.     Left: Skin change (edema of the skin with hyperpigmentation due to radiation) present. No swelling, bleeding, inverted nipple, mass, nipple discharge, tenderness, axillary adenopathy or supraclavicular adenopathy.    Abdominal:     General: There is no distension.     Palpations: Abdomen is soft. There is no hepatomegaly, splenomegaly or mass.     Tenderness: There is no abdominal tenderness.  Musculoskeletal:        General: Normal range of motion.     Cervical back: Normal range of motion and neck supple. No tenderness.     Right lower leg: No edema.     Left lower leg: No edema.  Lymphadenopathy:     Cervical: No cervical adenopathy.     Upper Body:     Right upper body: No supraclavicular or axillary adenopathy.     Left upper body: No supraclavicular or axillary adenopathy.     Lower Body: No right inguinal adenopathy. No left inguinal adenopathy.  Skin:    General: Skin is warm and dry.     Coloration: Skin is not jaundiced.  Findings: No rash.  Neurological:     Mental Status: She is alert and oriented to person, place, and time.     Cranial Nerves: No cranial nerve deficit.  Psychiatric:        Mood and Affect: Mood normal.        Behavior: Behavior normal.        Thought Content: Thought content normal.    LABS:   CBC Latest Ref Rng & Units 04/16/2020 03/26/2020 03/04/2020  WBC - 4.7 4.6 4.9  Hemoglobin 12.0 - 16.0 12.4 12.1 13.2  Hematocrit 36 - 46 38 37 39  Platelets 150 - 399 225 245 210   CMP Latest Ref Rng & Units 04/16/2020 03/26/2020 03/04/2020  Glucose 70 - 99 mg/dL - - -  BUN 4 - $R'21 14 12 14  'mJ$ Creatinine 0.5 - 1.1 1.0 1.0 0.9  Sodium 137 - 147 140 138 139  Potassium 3.4 - 5.3 3.5 3.9 3.6  Chloride 99 - 108 105 101 103  CO2 13 - 22 29(A) 27(A) 24(A)  Calcium 8.7 - 10.7  9.1 9.4 9.7  Total Protein 6.5 - 8.1 g/dL - - -  Total Bilirubin 0.3 - 1.2 mg/dL - - -  Alkaline Phos 25 - 125 81 75 76  AST 13 - 35 25 34 24  ALT 7 - 35 $Re'16 13 17     'pnm$ No results found for: CEA1 / No results found for: CEA1 No results found for: PSA1 No results found for: VCB449 No results found for: QPR916  No results found for: TOTALPROTELP, ALBUMINELP, A1GS, A2GS, BETS, BETA2SER, GAMS, MSPIKE, SPEI No results found for: TIBC, FERRITIN, IRONPCTSAT No results found for: LDH  STUDIES:  No results found.    HISTORY:   Past Medical History:  Diagnosis Date  . CAD (coronary artery disease) 09/06/2019  . Cardiomyopathy ([place]) 06/26/2019  . Chest pain on exertion 10/18/2016  . Hypertension 10/18/2016  . Malignant neoplasm of left breast ([place])   . Malignant neoplasm of left female breast ([place]) 11/23/2019  . Morbid (severe) obesity due to excess calories ([place]) 06/25/2019   Formatting of this note might be different from the original. per last BMI of 41.64 on 02/13/2018  . Tobacco use disorder 10/19/2016    Past Surgical History:  Procedure Laterality Date  . ABLATION    . TUBAL LIGATION      Family History  Problem Relation Age of Onset  . Throat cancer Mother   . Hypertension Father   . Stroke Father   . Heart disease Father     Social History:  reports that she quit smoking about 10 months ago. Her smoking use included cigarettes. She has never used smokeless tobacco. She reports previous alcohol use. She reports that she does not use drugs.The patient is alone today.  Allergies:  Allergies  Allergen Reactions  . Sulfa Antibiotics Hives    Current Medications: Current Outpatient Medications  Medication Sig Dispense Refill  . amLODipine (NORVASC) 5 MG tablet Take 5 mg by mouth daily.    Marland Kitchen aspirin EC 81 MG tablet Take 81 mg by mouth daily. Swallow whole.    Marland Kitchen atorvastatin (LIPITOR) 10 MG tablet Take 10 mg by mouth daily.    Marland Kitchen atorvastatin (LIPITOR) 20 MG tablet Take 1  tablet (20 mg total) by mouth daily. 90 tablet 3  . cetirizine (ZYRTEC) 10 MG tablet Take 10 mg by mouth daily.    . DULoxetine (CYMBALTA) 30 MG capsule Take 30 mg  by mouth 2 (two) times daily.    . hydrOXYzine (ATARAX/VISTARIL) 25 MG tablet Take 25 mg by mouth 4 (four) times daily as needed.    . loratadine (CLARITIN) 10 MG tablet Take 10 mg by mouth daily.    . methimazole (TAPAZOLE) 10 MG tablet Take 30 mg by mouth daily.    . nitroGLYCERIN (NITROSTAT) 0.4 MG SL tablet Place 1 tablet (0.4 mg total) under the tongue every 5 (five) minutes as needed. 25 tablet 6  . ondansetron (ZOFRAN) 4 MG tablet Take 4 mg by mouth every 4 (four) hours as needed.    . Oxycodone HCl 10 MG TABS Take 10 mg by mouth 4 (four) times daily as needed.    . prochlorperazine (COMPAZINE) 10 MG tablet Take 10 mg by mouth every 6 (six) hours as needed.    . sacubitril-valsartan (ENTRESTO) 24-26 MG Take 1 tablet by mouth 2 (two) times daily. 60 tablet 3  . traMADol (ULTRAM) 50 MG tablet Take 1 tablet (50 mg total) by mouth every 6 (six) hours as needed (pain). 60 tablet 1  . traZODone (DESYREL) 50 MG tablet Take 50 mg by mouth at bedtime as needed.    . TYLENOL 500 MG tablet Take 1,000 mg by mouth every 8 (eight) hours as needed.    . venlafaxine XR (EFFEXOR-XR) 37.5 MG 24 hr capsule Take 37.5 mg by mouth 2 (two) times daily.     No current facility-administered medications for this visit.     ASSESSMENT & PLAN:   Assessment:  1. 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 No primary breast lesion was found. She was treated with neoadjuvant docetaxel/carboplatin/trastuzumab, left axillary lymph node dissection and adjuvant radiation therapy to the left breast and axilla.She continues maintenance trastuzumab.  2. Cardiomyopathy with decreased left ventricular ejection fraction. This is being managed by [name]. [name] from September revealed an ejection  fraction of 39%, which was stable. However, most recentechocardiogramrevealed an ejection fraction between 60-65%. She does not have any symptoms of heart failure.  We planned to hold chemotherapy until a repeat echocardiogram can be done.  3. Bilateral hip and back pain, which is stable. MRI lumbar spine and left hip did not reveal any evidence of malignancy.  There  were degenerative changes in the lumbar spine at L5-S1. She is hoping to resume physical therapy soon.  4.  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  Plan:     We had planned to delay trastuzumab until she had an echocardiogram, but this cannot be scheduled until March 21st. As she does not have signs or symptoms of heart failure, Dr. Hinton Rao recommended proceeding with trastuzumab as scheduled tomorrow.  We will plan to see her back in 3 weeks with a CBC and comprehensive metabolic panel prior to a 13th cycle of maintenance trastuzumab.  The patient understands the plans discussed today and is in agreement with them.  She knows to contact our office if she develops concerns prior to her next appointment.     Brandi Pickles, PA-C

## 2020-04-16 NOTE — Telephone Encounter (Signed)
Scheduled patient's ECHO w/Kira @ Dr Ervin Knack for 3/21 at 7:15 am (check in at 7:00 am)  Per Dr Novella Rob, patient will keep 3/10 Infusion Appt Vida Roller informed patient

## 2020-04-17 ENCOUNTER — Inpatient Hospital Stay: Payer: BC Managed Care – PPO

## 2020-04-17 VITALS — BP 106/56 | HR 65 | Temp 97.8°F | Resp 18 | Ht 63.0 in | Wt 225.2 lb

## 2020-04-17 DIAGNOSIS — C50912 Malignant neoplasm of unspecified site of left female breast: Secondary | ICD-10-CM

## 2020-04-17 DIAGNOSIS — Z5112 Encounter for antineoplastic immunotherapy: Secondary | ICD-10-CM | POA: Diagnosis present

## 2020-04-17 DIAGNOSIS — Z170 Estrogen receptor positive status [ER+]: Secondary | ICD-10-CM | POA: Diagnosis not present

## 2020-04-17 DIAGNOSIS — C773 Secondary and unspecified malignant neoplasm of axilla and upper limb lymph nodes: Secondary | ICD-10-CM | POA: Diagnosis present

## 2020-04-17 DIAGNOSIS — Z801 Family history of malignant neoplasm of trachea, bronchus and lung: Secondary | ICD-10-CM | POA: Diagnosis not present

## 2020-04-17 DIAGNOSIS — I429 Cardiomyopathy, unspecified: Secondary | ICD-10-CM | POA: Diagnosis not present

## 2020-04-17 DIAGNOSIS — Z87891 Personal history of nicotine dependence: Secondary | ICD-10-CM | POA: Diagnosis not present

## 2020-04-17 DIAGNOSIS — Z7982 Long term (current) use of aspirin: Secondary | ICD-10-CM | POA: Diagnosis not present

## 2020-04-17 DIAGNOSIS — I10 Essential (primary) hypertension: Secondary | ICD-10-CM | POA: Diagnosis not present

## 2020-04-17 DIAGNOSIS — Z8249 Family history of ischemic heart disease and other diseases of the circulatory system: Secondary | ICD-10-CM | POA: Diagnosis not present

## 2020-04-17 DIAGNOSIS — Z79899 Other long term (current) drug therapy: Secondary | ICD-10-CM | POA: Diagnosis not present

## 2020-04-17 DIAGNOSIS — C50412 Malignant neoplasm of upper-outer quadrant of left female breast: Secondary | ICD-10-CM | POA: Diagnosis not present

## 2020-04-17 MED ORDER — SODIUM CHLORIDE 0.9 % IV SOLN
Freq: Once | INTRAVENOUS | Status: AC
  Filled 2020-04-17: qty 250

## 2020-04-17 MED ORDER — DIPHENHYDRAMINE HCL 50 MG/ML IJ SOLN: 25.0000 mg | Freq: Once | INTRAMUSCULAR | Status: DC

## 2020-04-17 MED ORDER — ACETAMINOPHEN 325 MG PO TABS: 650.0000 mg | ORAL_TABLET | Freq: Once | ORAL | Status: DC

## 2020-04-17 MED ORDER — HEPARIN SOD (PORK) LOCK FLUSH 100 UNIT/ML IV SOLN
500.0000 [IU] | Freq: Once | INTRAVENOUS | Status: AC | PRN
  Administered 2020-04-17: 500 [IU]
  Filled 2020-04-17: qty 5

## 2020-04-17 MED ORDER — TRASTUZUMAB-DKST CHEMO 150 MG IV SOLR
6.0000 mg/kg | Freq: Once | INTRAVENOUS | Status: AC
  Administered 2020-04-17: 609 mg via INTRAVENOUS
  Filled 2020-04-17: qty 29

## 2020-04-17 NOTE — Patient Instructions (Signed)
[facility] Discharge Instructions for Patients Receiving Chemotherapy  Today you received the following chemotherapy agents trastuzumab  To help prevent nausea and vomiting after your treatment, we encourage you to take your nausea medication.   If you develop nausea and vomiting that is not controlled by your nausea medication, call the clinic.   BELOW ARE SYMPTOMS THAT SHOULD BE REPORTED IMMEDIATELY:  *FEVER GREATER THAN 100.5 F  *CHILLS WITH OR WITHOUT FEVER  NAUSEA AND VOMITING THAT IS NOT CONTROLLED WITH YOUR NAUSEA MEDICATION  *UNUSUAL SHORTNESS OF BREATH  *UNUSUAL BRUISING OR BLEEDING  TENDERNESS IN MOUTH AND THROAT WITH OR WITHOUT PRESENCE OF ULCERS  *URINARY PROBLEMS  *BOWEL PROBLEMS  UNUSUAL RASH Items with * indicate a potential emergency and should be followed up as soon as possible.  Feel free to call the clinic should you have any questions or concerns at The clinic phone number is [phone].  Please show the CHEMO ALERT CARD at check-in to the Emergency Department and triage nurse.   

## 2020-04-17 NOTE — Progress Notes (Signed)
Pt d/c stable 1027

## 2020-04-28 ENCOUNTER — Other Ambulatory Visit: Payer: Self-pay

## 2020-04-28 ENCOUNTER — Ambulatory Visit (INDEPENDENT_AMBULATORY_CARE_PROVIDER_SITE_OTHER): Payer: BC Managed Care – PPO

## 2020-04-28 DIAGNOSIS — C50912 Malignant neoplasm of unspecified site of left female breast: Secondary | ICD-10-CM | POA: Diagnosis not present

## 2020-04-28 DIAGNOSIS — Z0189 Encounter for other specified special examinations: Secondary | ICD-10-CM | POA: Diagnosis not present

## 2020-04-28 DIAGNOSIS — Z171 Estrogen receptor negative status [ER-]: Secondary | ICD-10-CM

## 2020-04-28 LAB — ECHOCARDIOGRAM COMPLETE
Area-P 1/2: 4.06 cm2
Calc EF: 52.7 %
S' Lateral: 4.8 cm
Single Plane A2C EF: 56.7 %
Single Plane A4C EF: 49.9 %

## 2020-04-28 NOTE — Progress Notes (Signed)
Complete echocardiogram performed.  Jimmy Vicent Febles RDCS, RVT  

## 2020-04-28 NOTE — Progress Notes (Signed)
Complete echocardiogram performed.  Jimmy Semira Stoltzfus RDCS, RVT  

## 2020-05-03 ENCOUNTER — Other Ambulatory Visit: Payer: Self-pay | Admitting: Oncology

## 2020-05-07 ENCOUNTER — Encounter: Payer: Self-pay | Admitting: Hematology and Oncology

## 2020-05-07 ENCOUNTER — Other Ambulatory Visit: Payer: Self-pay

## 2020-05-07 ENCOUNTER — Inpatient Hospital Stay: Payer: BC Managed Care – PPO

## 2020-05-07 ENCOUNTER — Other Ambulatory Visit: Payer: Self-pay | Admitting: Hematology and Oncology

## 2020-05-07 ENCOUNTER — Inpatient Hospital Stay (INDEPENDENT_AMBULATORY_CARE_PROVIDER_SITE_OTHER): Payer: BC Managed Care – PPO | Admitting: Hematology and Oncology

## 2020-05-07 VITALS — BP 134/75 | HR 92 | Temp 98.8°F | Resp 20 | Ht 63.0 in | Wt 228.9 lb

## 2020-05-07 DIAGNOSIS — Z171 Estrogen receptor negative status [ER-]: Secondary | ICD-10-CM | POA: Diagnosis not present

## 2020-05-07 DIAGNOSIS — C50012 Malignant neoplasm of nipple and areola, left female breast: Secondary | ICD-10-CM | POA: Diagnosis not present

## 2020-05-07 DIAGNOSIS — C50912 Malignant neoplasm of unspecified site of left female breast: Secondary | ICD-10-CM

## 2020-05-07 LAB — CBC AND DIFFERENTIAL
HCT: 37 (ref 36–46)
Hemoglobin: 12.2 (ref 12.0–16.0)
Neutrophils Absolute: 4.69
Platelets: 250 (ref 150–399)
WBC: 6.6

## 2020-05-07 LAB — COMPREHENSIVE METABOLIC PANEL
Albumin: 4.4 (ref 3.5–5.0)
Calcium: 9.4 (ref 8.7–10.7)

## 2020-05-07 LAB — BASIC METABOLIC PANEL
BUN: 16 (ref 4–21)
CO2: 25 — AB (ref 13–22)
Chloride: 103 (ref 99–108)
Creatinine: 0.9 (ref 0.5–1.1)
Glucose: 124
Potassium: 3.6 (ref 3.4–5.3)
Sodium: 138 (ref 137–147)

## 2020-05-07 LAB — HEPATIC FUNCTION PANEL
ALT: 21 (ref 7–35)
AST: 25 (ref 13–35)
Alkaline Phosphatase: 82 (ref 25–125)
Bilirubin, Total: 0.7

## 2020-05-07 LAB — CBC: RBC: 4.14 (ref 3.87–5.11)

## 2020-05-07 NOTE — Progress Notes (Signed)
[facility]  [address] [place],    [address] [phone]  Clinic Day:  05/07/2020  Referring physician: Marlene Lard, PA-C   CHIEF COMPLAINT:  CC:   Stage IIA HER2 receptor positive breast cancer  Current Treatment:  Maintenance trastuzumab   HISTORY OF PRESENT ILLNESS:  Brandi Weaver is a 55 y.o. female with stage IIA (T0 N1 M0) HER2 receptor positive left breast cancer diagnosed in April 2021. The patient felt a mass in the left axilla in March and reported this to her primary care provider. Her last bilateral screening mammogram had been in November 2019 with a diagnostic left mammogram and ultrasound in December which revealed probable inframammary lymph node over the upper outer quadrant. Six-month follow-up was recommended. Ultrasound in March 2021 revealed multiple hypoechoic lesions of the left axilla with the largest up to 4.6 cm in diameter with another measuring 3.3 cm and another 2.6 cm. The changes were felt possibly represent lymphadenitis. Bilateral diagnostic mammogram at [facility] March revealed at least 7 enlarged intramammary and axillary lymph nodes. No primary breast lesion was found. Targeted left breast ultrasound revealed an increase in the previously demonstrated 7 mm intramammary node at 1:30 o'clock measured 13 mm with diffuse cortical thickening. There were multiple enlarged left axillary nodes with eccentric cortical thickening with a maximum thickness of 19.8 mm. Ultrasound-guided biopsy of the left axillary lymph node in April revealed metastatic carcinoma consistent with breast primary. GATA 3 was positive with negative thyroglobulin, negative Napsin A and TTF1, and the PAX8 was equivocal. Estrogen and progesterone receptors werenegative and HER 2 positive. Ki 67 was 40%. Staging CT chest, abdomen and pelvis revealed bulky left axillary lymphadenopathy extending to the sub pectoralis nodal  station with a 6 mm right upper paratracheal lymph node. There was no evidence of metastatic disease in the lungs, abdomen or pelvis. Breast MRI revealed a 1 x 1.2 oval enhancing mass within the posterior upper outer left breast felt to be an intraparenchymal lymph node, no breast masses were seen.  There was re-demonstration of the left axillary adenopathy. On physical examination the left axillary adenopathy measured approximately 9 x 7 cm. She had uterine ablation in 2011 for uterine fibroids, then experienced 1 day of uterine bleeding in 2012, but denies further bleeding.   We recommended neoadjuvant chemotherapy to include TCHP x 6 cycles with trastuzumab and pertuzumab for one full year. Unfortunately, echocardiogram on May 4th revealed ejection fraction between 40 and 45%. This was confirmed with [name] on May 10th, which revealed an ejection fraction of 42%. Based on these findings, we changed the treatment regimen to docetaxel/carboplatin/trastuzumab [facility]) and eliminated pertuzumab, due to the potential cardiotoxicities from her to targeted therapies. We also recommended repeat cardiac imaging every 6 weeks and instead of the standard every 12 weeks. She was referred to [name] and he started her on Entresto and has been monitoring her cardiac function. She reported some chest tightness to him, so he recommended a [name]. She had quit smoking. She received her 1st cycle of docetaxel, carboplatin and trastuzumab on May 14th. She has some toxicities of decreased appetite, minimal nausea and vomiting, and generalized arthralgias. Her biggest complaint was generalized achiness and hip pain, felt to be most likely due to [name]. Her pain was not controlled with hydrocodone/APAP 5/325 or 10/325, so she was given oxycodone 5 mg 1-2 tablets every 6 hours as needed for pain. She was placed on esomeprazole 40 mg  daily for acid reflux. CT imaging in May revealed bulky left axillary  lymphadenopathy, which extends to the sub pectoralis nodal station, as well as a small right upper paratracheal lymph node. No internal mammary or central thoracic metastasis identified. No evidence of metastatic disease in the abdomen or pelvis. She continues Entresto daily without difficulty. She underwent nuclear stress test at [name] office on June 8th, which revealed a fairly stable ejection fraction of 39%. She was also recommended for CT coronary angiography, which is to be scheduled at [facility]. She underwent a coronary/cardiac CT on July 8th revealed moderate CAD in proximal LAD and mild CAD in mid left main coronary artery, CADRADS = 3, coronary calcium score is 168, which places the patient in the 81 percentile for age and dex matched control, normal coronary origin with right dominance and mildly dilated main pulmonary artery, 30 mm. [name] from August 4th revealed a left ventricular ejection fraction of 39%, previously 42%in May, which was discussed with [name]. We discussed the risks and benefits with the patient again. Overall, we feel that the benefit outweighs any further risk, as long as she is closely monitored. Bilateral breast MRI from September 20th revealed smaller left axillary lymph nodes consistent with interval treatment. However, there is a persistent but smaller circumscribed oval mass in the upper outer quadrant of the left breast. We discussed this with [name] of breast radiology and we suspect this could represent the breast primary, but biopsy was negative. [name] imaging from the same day revealed a left ventricular fraction is 39%, stable. She quit smoking in April 2021.   Ultrasound guided left breast core needle biopsyinSeptember revealed lymphoid tissue with reactive lymphoid hyperplasia and was negative for granulomas or malignancy. No metastatic breast carcinoma could be seen. She then underwent left axillary lymph node dissection on  October 14th with [name]. Surgical pathology from this procedure found tumor present in 3 of 27 lymph nodes (3/27). One lymph node was positive for macrometastasis measuring 34m. Two lymph nodes had micrometastasis. There were no lymph nodes with isolated tumor cells and no extranodal tumor seen.Echocardiogramfrom November 4th revealed a normal ejection fraction between 60-65%.She was referred to physical therapy for range of motion after her surgery. She startedmaintenance trastuzumab on November 19th.  She continued to report persistent severe left hippain, with mild pain in the right hip. Previous left hip x-ray did not reveal any abnormality.CT imaging in May also did not reveal any abnormality in the hips or lumbar spine.She has not received chemotherapy or [name] since September, so we did not feel her pain can be attributed to this.   MRI lumbar spine and left hip for further evaluation.  There was mild-to-moderate facet arthropathy at L5-S1, which was worse on the right on MRI lumbar spine.  MRI left hip was negative. Her primary care provider referred her for physical therapy.  She has continued trastuzumab every 3 weeks.  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   INTERVAL HISTORY:  Brandi Weaver here today for repeat clinical assessment prior to a 12th cycle of trastuzumab. Unfortunately, her echocardiogram did not get rescheduled prior to her visit today.  She denies dyspnea, chest pain or lower extremity edema.  She states that the radiation dermatitis is healing well and she denies other changes in her breasts. She denies fevers or chills. She continues to report lower back pain. Physical therapy was placed on hold  due to her radiation dermatitis.  She plans to resume physical therapy soon. Her appetite is good. Her weight has increased 4 pounds over last 3 weeks.  REVIEW OF SYSTEMS:  Review of Systems   Constitutional: Negative for appetite change, chills, fatigue, fever and unexpected weight change.  HENT:   Negative for lump/mass, mouth sores and sore throat.   Respiratory: Negative for cough and shortness of breath.   Cardiovascular: Negative for chest pain and leg swelling.  Gastrointestinal: Negative for abdominal pain, constipation, diarrhea, nausea and vomiting.  Genitourinary: Negative for difficulty urinating, dysuria, frequency and hematuria.   Musculoskeletal: Negative for arthralgias, back pain and myalgias.  Skin: Negative for rash.  Neurological: Negative for dizziness and headaches.  Psychiatric/Behavioral: Negative for depression and sleep disturbance. The patient is not nervous/anxious.      VITALS:  Blood pressure 134/75, pulse 92, temperature 98.8 F (37.1 C), temperature source Oral, resp. rate 20, height 5' 3" (1.6 m), weight 228 lb 14.4 oz (103.8 kg), SpO2 98 %.  Wt Readings from Last 3 Encounters:  05/07/20 228 lb 14.4 oz (103.8 kg)  04/17/20 225 lb 4 oz (102.2 kg)  04/16/20 226 lb 3.2 oz (102.6 kg)    Body mass index is 40.55 kg/m.  Performance status (ECOG): 1 - Symptomatic but completely ambulatory  PHYSICAL EXAM:  Physical Exam Vitals and nursing note reviewed.  Constitutional:      General: She is not in acute distress.    Appearance: Normal appearance.  HENT:     Head: Normocephalic and atraumatic.     Mouth/Throat:     Mouth: Mucous membranes are moist.     Pharynx: Oropharynx is clear. No oropharyngeal exudate or posterior oropharyngeal erythema.  Eyes:     General: No scleral icterus.    Extraocular Movements: Extraocular movements intact.     Conjunctiva/sclera: Conjunctivae normal.     Pupils: Pupils are equal, round, and reactive to light.  Cardiovascular:     Rate and Rhythm: Normal rate and regular rhythm.     Heart sounds: Normal heart sounds. No murmur heard. No friction rub. No gallop.   Pulmonary:     Effort: Pulmonary effort  is normal.     Breath sounds: Normal breath sounds. No wheezing, rhonchi or rales.  Chest:  Breasts:     Right: Normal. No swelling, bleeding, inverted nipple, mass, nipple discharge, skin change, axillary adenopathy or supraclavicular adenopathy.     Left: Skin change (edema of the skin with hyperpigmentation due to radiation) present. No swelling, bleeding, inverted nipple, mass, nipple discharge, tenderness, axillary adenopathy or supraclavicular adenopathy.    Abdominal:     General: There is no distension.     Palpations: Abdomen is soft. There is no hepatomegaly, splenomegaly or mass.     Tenderness: There is no abdominal tenderness.  Musculoskeletal:        General: Normal range of motion.     Cervical back: Normal range of motion and neck supple. No tenderness.     Right lower leg: No edema.     Left lower leg: No edema.  Lymphadenopathy:     Cervical: No cervical adenopathy.     Upper Body:     Right upper body: No supraclavicular or axillary adenopathy.     Left upper body: No supraclavicular or axillary adenopathy.     Lower Body: No right inguinal adenopathy. No left inguinal adenopathy.  Skin:    General: Skin is warm and dry.  Coloration: Skin is not jaundiced.     Findings: No rash.  Neurological:     Mental Status: She is alert and oriented to person, place, and time.     Cranial Nerves: No cranial nerve deficit.  Psychiatric:        Mood and Affect: Mood normal.        Behavior: Behavior normal.        Thought Content: Thought content normal.    LABS:   CBC Latest Ref Rng & Units 05/07/2020 04/16/2020 03/26/2020  WBC - 6.6 4.7 4.6  Hemoglobin 12.0 - 16.0 12.2 12.4 12.1  Hematocrit 36 - 46 37 38 37  Platelets 150 - 399 250 225 245   CMP Latest Ref Rng & Units 05/07/2020 04/16/2020 03/26/2020  Glucose 70 - 99 mg/dL - - -  BUN 4 - _0 Creatinine 0.5 - 1.1 0.9 1.0 1.0  Sodium 137 - 147 138 140 138  Potassium 3.4 - 5.3 3.6 3.5 3.9  Chloride 99 - 108  103 105 101  CO2 13 - 22 25(A) 29(A) 27(A)  Calcium 8.7 - 10.7 9.4 9.1 9.4  Total Protein 6.5 - 8.1 g/dL - - -  Total Bilirubin 0.3 - 1.2 mg/dL - - -  Alkaline Phos 25 - 125 82 81 75  AST 13 - 35 25 25 34  ALT 7 - 35 _1 No results found for: CEA1 / No results found for: CEA1 No results found for: PSA1 No results found for: WER154 No results found for: MGQ676  No results found for: TOTALPROTELP, ALBUMINELP, A1GS, A2GS, BETS, BETA2SER, GAMS, MSPIKE, SPEI No results found for: TIBC, FERRITIN, IRONPCTSAT No results found for: LDH  STUDIES:  ECHOCARDIOGRAM COMPLETE  Result Date: 04/28/2020    ECHOCARDIOGRAM REPORT   Patient Name:   Brandi Weaver Date of Exam: 04/28/2020 Medical Rec #:  [id number]         Height:       63.0 in Accession #:    [id number]        Weight:       225.2 lb Date of Birth:  01/18/1966         BSA:          2.034 m Patient Age:    53 years          BP:           147/73 mmHg Patient Gender: F                 HR:           61 bpm. Exam Location:  [facility] Procedure: 2D Echo Indications:    Malignant neoplasm of left breast in female, estrogen receptor                 negative, unspecified site of breast ([place]) [C50.912, Z17.1]  History:        Patient has prior history of Echocardiogram examinations, most                 recent 12/13/2019. Cardiomyopathy, CAD, Malignant neoplasm of                 left female breast, Signs/Symptoms:Chest Pain and Morbid                 (severe) obesity due to excess calories; Risk  Factors:Hypertension and Tobacco use disorder.  Sonographer:    Luane School Referring Phys: [place]  1. GLS: -7.8. Left ventricular ejection fraction, by estimation, is 45 to 50%. The left ventricle has mildly decreased function. The left ventricle has no regional wall motion abnormalities. The left ventricular internal cavity size was mildly to moderately dilated. Left ventricular diastolic parameters were normal.   2. Right ventricular systolic function is normal. The right ventricular size is normal. There is normal pulmonary artery systolic pressure.  3. The mitral valve is normal in structure. No evidence of mitral valve regurgitation. No evidence of mitral stenosis.  4. The aortic valve is normal in structure. Aortic valve regurgitation is not visualized. No aortic stenosis is present.  5. The inferior vena cava is normal in size with greater than 50% respiratory variability, suggesting right atrial pressure of 3 mmHg. FINDINGS  Left Ventricle: GLS: -7.8. Left ventricular ejection fraction, by estimation, is 45 to 50%. The left ventricle has mildly decreased function. The left ventricle has no regional wall motion abnormalities. The left ventricular internal cavity size was mildly to moderately dilated. There is no left ventricular hypertrophy. Left ventricular diastolic parameters were normal. Right Ventricle: The right ventricular size is normal. No increase in right ventricular wall thickness. Right ventricular systolic function is normal. There is normal pulmonary artery systolic pressure. The tricuspid regurgitant velocity is 2.28 m/s, and  with an assumed right atrial pressure of 3 mmHg, the estimated right ventricular systolic pressure is 79.3 mmHg. Left Atrium: Left atrial size was normal in size. Right Atrium: Right atrial size was normal in size. Pericardium: There is no evidence of pericardial effusion. Mitral Valve: The mitral valve is normal in structure. No evidence of mitral valve regurgitation. No evidence of mitral valve stenosis. Tricuspid Valve: The tricuspid valve is normal in structure. Tricuspid valve regurgitation is mild . No evidence of tricuspid stenosis. Aortic Valve: The aortic valve is normal in structure. Aortic valve regurgitation is not visualized. No aortic stenosis is present. Pulmonic Valve: The pulmonic valve was normal in structure. Pulmonic valve regurgitation is not visualized. No  evidence of pulmonic stenosis. Aorta: The aortic root is normal in size and structure. Venous: The inferior vena cava is normal in size with greater than 50% respiratory variability, suggesting right atrial pressure of 3 mmHg. IAS/Shunts: No atrial level shunt detected by color flow Doppler.  LEFT VENTRICLE PLAX 2D LVIDd:         5.70 cm     Diastology LVIDs:         4.80 cm     LV e' medial:    6.42 cm/s LV PW:         1.10 cm     LV E/e' medial:  10.1 LV IVS:        1.10 cm     LV e' lateral:   7.51 cm/s LVOT diam:     2.10 cm     LV E/e' lateral: 8.6 LV SV:         67 LV SV Index:   33 LVOT Area:     3.46 cm  LV Volumes (MOD) LV vol d, MOD A2C: 87.0 ml LV vol d, MOD A4C: 87.2 ml LV vol s, MOD A2C: 37.7 ml LV vol s, MOD A4C: 43.7 ml LV SV MOD A2C:     49.3 ml LV SV MOD A4C:     87.2 ml LV SV MOD BP:  47.1 ml RIGHT VENTRICLE             IVC RV S prime:     11.90 cm/s  IVC diam: 1.50 cm TAPSE (M-mode): 2.4 cm LEFT ATRIUM             Index       RIGHT ATRIUM           Index LA diam:        4.00 cm 1.97 cm/m  RA Area:     11.80 cm LA Vol (A2C):   59.3 ml 29.16 ml/m RA Volume:   28.10 ml  13.82 ml/m LA Vol (A4C):   44.7 ml 21.98 ml/m LA Biplane Vol: 51.9 ml 25.52 ml/m  AORTIC VALVE LVOT Vmax:   83.37 cm/s LVOT Vmean:  59.300 cm/s LVOT VTI:    0.194 m  AORTA Ao Root diam: 3.10 cm Ao Desc diam: 2.00 cm MITRAL VALVE               TRICUSPID VALVE MV Area (PHT): 4.06 cm    TR Peak grad:   20.8 mmHg MV Decel Time: 187 msec    TR Vmax:        228.00 cm/s MV E velocity: 64.70 cm/s MV A velocity: 70.30 cm/s  SHUNTS MV E/A ratio:  0.92        Systemic VTI:  0.19 m                            Systemic Diam: 2.10 cm Jenne Campus MD Electronically signed by Jenne Campus MD Signature Date/Time: 04/28/2020/12:07:24 PM    Final     HISTORY:   Past Medical History:  Diagnosis Date  . CAD (coronary artery disease) 09/06/2019  . Cardiomyopathy ([place]) 06/26/2019  . Chest pain on exertion 10/18/2016  . Hypertension  10/18/2016  . Malignant neoplasm of left breast ([place])   . Malignant neoplasm of left female breast ([place]) 11/23/2019  . Morbid (severe) obesity due to excess calories ([place]) 06/25/2019   Formatting of this note might be different from the original. per last BMI of 41.64 on 02/13/2018  . Tobacco use disorder 10/19/2016    Past Surgical History:  Procedure Laterality Date  . ABLATION    . TUBAL LIGATION      Family History  Problem Relation Age of Onset  . Throat cancer Mother   . Hypertension Father   . Stroke Father   . Heart disease Father     Social History:  reports that she quit smoking about 11 months ago. Her smoking use included cigarettes. She has never used smokeless tobacco. She reports previous alcohol use. She reports that she does not use drugs.The patient is alone today.  Allergies:  Allergies  Allergen Reactions  . Sulfa Antibiotics Hives    Current Medications: Current Outpatient Medications  Medication Sig Dispense Refill  . amLODipine (NORVASC) 5 MG tablet Take 5 mg by mouth daily.    Marland Kitchen aspirin EC 81 MG tablet Take 81 mg by mouth daily. Swallow whole.    Marland Kitchen atorvastatin (LIPITOR) 10 MG tablet Take 10 mg by mouth daily.    Marland Kitchen atorvastatin (LIPITOR) 20 MG tablet Take 1 tablet (20 mg total) by mouth daily. 90 tablet 3  . cetirizine (ZYRTEC) 10 MG tablet Take 10 mg by mouth daily.    . DULoxetine (CYMBALTA) 30 MG capsule Take 30 mg by mouth 2 (two) times daily.    Marland Kitchen  hydrOXYzine (ATARAX/VISTARIL) 25 MG tablet Take 25 mg by mouth 4 (four) times daily as needed.    . loratadine (CLARITIN) 10 MG tablet Take 10 mg by mouth daily.    . methimazole (TAPAZOLE) 10 MG tablet Take 30 mg by mouth daily.    . nitroGLYCERIN (NITROSTAT) 0.4 MG SL tablet Place 1 tablet (0.4 mg total) under the tongue every 5 (five) minutes as needed. 25 tablet 6  . ondansetron (ZOFRAN) 4 MG tablet Take 4 mg by mouth every 4 (four) hours as needed.    . Oxycodone HCl 10 MG TABS Take 10 mg by mouth 4  (four) times daily as needed.    . prochlorperazine (COMPAZINE) 10 MG tablet Take 10 mg by mouth every 6 (six) hours as needed.    . sacubitril-valsartan (ENTRESTO) 24-26 MG Take 1 tablet by mouth 2 (two) times daily. 60 tablet 3  . traMADol (ULTRAM) 50 MG tablet Take 1 tablet (50 mg total) by mouth every 6 (six) hours as needed (pain). 60 tablet 1  . traZODone (DESYREL) 50 MG tablet Take 50 mg by mouth at bedtime as needed.    . TYLENOL 500 MG tablet Take 1,000 mg by mouth every 8 (eight) hours as needed.    . venlafaxine XR (EFFEXOR-XR) 37.5 MG 24 hr capsule Take 37.5 mg by mouth 2 (two) times daily.     No current facility-administered medications for this visit.     ASSESSMENT & PLAN:   Assessment:  1. 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 No primary breast lesion was found. She was treated with neoadjuvant docetaxel/carboplatin/trastuzumab, left axillary lymph node dissection and adjuvant radiation therapy to the left breast and axilla.She continues maintenance trastuzumab.  2. Cardiomyopathy with decreased left ventricular ejection fraction. This is being managed by [name]. [name] from September revealed an ejection fraction of 39%, which was stable. Echocardiogramin November revealed an ejection fraction between 60-65%, which was a puzzle, as it has never been normal. Most recent echocardiogram reveals and ejection fraction of 45-50%, which is closer to her baseline. She remains without symptoms of heart failure. The patient understands that continued trastuzumab can cause worsening heart function, but this is considered reversible. She has 5 treatments remaining. We will plan to proceed with treatment and obtain another echocardiogram in 6 weeks instead of 12 weeks.  3. Bilateral hip and back pain, which is stable. MRI lumbar spine and left hip did not reveal any evidence of malignancy.  There  were degenerative  changes in the lumbar spine at L5-S1. She plans to resume physical therapy soon.  4.  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  Plan:   We will proceed with trastuzumab this week and again in 3 weeks.  I will plan to see her back in 6 weeks with a CBC and comprehensive metabolic panel prior to a 15th cycle of maintenance trastuzumab.  The patient understands the plans discussed today and is in agreement with them.  She knows to contact our office if she develops concerns prior to her next appointment.     Marvia Pickles, PA-C

## 2020-05-09 ENCOUNTER — Telehealth: Payer: Self-pay | Admitting: Hematology and Oncology

## 2020-05-09 ENCOUNTER — Inpatient Hospital Stay: Payer: BC Managed Care – PPO | Attending: Hematology and Oncology

## 2020-05-09 ENCOUNTER — Other Ambulatory Visit: Payer: Self-pay | Admitting: Hematology and Oncology

## 2020-05-09 ENCOUNTER — Other Ambulatory Visit: Payer: Self-pay

## 2020-05-09 ENCOUNTER — Encounter: Payer: Self-pay | Admitting: Hematology and Oncology

## 2020-05-09 VITALS — BP 157/72 | HR 85 | Temp 98.6°F | Resp 20 | Ht 63.0 in | Wt 226.8 lb

## 2020-05-09 DIAGNOSIS — Z87891 Personal history of nicotine dependence: Secondary | ICD-10-CM | POA: Insufficient documentation

## 2020-05-09 DIAGNOSIS — C773 Secondary and unspecified malignant neoplasm of axilla and upper limb lymph nodes: Secondary | ICD-10-CM | POA: Insufficient documentation

## 2020-05-09 DIAGNOSIS — M47816 Spondylosis without myelopathy or radiculopathy, lumbar region: Secondary | ICD-10-CM | POA: Diagnosis not present

## 2020-05-09 DIAGNOSIS — L598 Other specified disorders of the skin and subcutaneous tissue related to radiation: Secondary | ICD-10-CM | POA: Diagnosis not present

## 2020-05-09 DIAGNOSIS — Z170 Estrogen receptor positive status [ER+]: Secondary | ICD-10-CM | POA: Insufficient documentation

## 2020-05-09 DIAGNOSIS — M47817 Spondylosis without myelopathy or radiculopathy, lumbosacral region: Secondary | ICD-10-CM | POA: Insufficient documentation

## 2020-05-09 DIAGNOSIS — Z5112 Encounter for antineoplastic immunotherapy: Secondary | ICD-10-CM | POA: Insufficient documentation

## 2020-05-09 DIAGNOSIS — C50412 Malignant neoplasm of upper-outer quadrant of left female breast: Secondary | ICD-10-CM | POA: Diagnosis not present

## 2020-05-09 DIAGNOSIS — C50912 Malignant neoplasm of unspecified site of left female breast: Secondary | ICD-10-CM

## 2020-05-09 DIAGNOSIS — I429 Cardiomyopathy, unspecified: Secondary | ICD-10-CM | POA: Diagnosis not present

## 2020-05-09 MED ORDER — SODIUM CHLORIDE 0.9% FLUSH
10.0000 mL | INTRAVENOUS | Status: DC | PRN
  Administered 2020-05-09: 10 mL
  Filled 2020-05-09: qty 10

## 2020-05-09 MED ORDER — DIPHENHYDRAMINE HCL 50 MG/ML IJ SOLN
INTRAMUSCULAR | Status: AC
  Filled 2020-05-09: qty 1

## 2020-05-09 MED ORDER — DIPHENHYDRAMINE HCL 50 MG/ML IJ SOLN
25.0000 mg | Freq: Once | INTRAMUSCULAR | Status: AC
  Administered 2020-05-09: 25 mg via INTRAVENOUS

## 2020-05-09 MED ORDER — ACETAMINOPHEN 325 MG PO TABS: 650.0000 mg | ORAL_TABLET | Freq: Once | ORAL | Status: DC

## 2020-05-09 MED ORDER — HEPARIN SOD (PORK) LOCK FLUSH 100 UNIT/ML IV SOLN
500.0000 [IU] | Freq: Once | INTRAVENOUS | Status: AC | PRN
  Administered 2020-05-09: 500 [IU]
  Filled 2020-05-09: qty 5

## 2020-05-09 MED ORDER — TRASTUZUMAB-ANNS CHEMO 150 MG IV SOLR
6.0000 mg/kg | Freq: Once | INTRAVENOUS | Status: AC
  Administered 2020-05-09: 609 mg via INTRAVENOUS
  Filled 2020-05-09: qty 29

## 2020-05-09 MED ORDER — SODIUM CHLORIDE 0.9 % IV SOLN
Freq: Once | INTRAVENOUS | Status: AC
  Filled 2020-05-09: qty 250

## 2020-05-09 NOTE — Telephone Encounter (Signed)
05/09/20 Spoke with patient and Echo scheduled on 06/03/20@115pm  at [facility]

## 2020-05-09 NOTE — Patient Instructions (Signed)
Trastuzumab injection for infusion What is this medicine? TRASTUZUMAB (tras TOO zoo mab) is a monoclonal antibody. It is used to treat breast cancer and stomach cancer. This medicine may be used for other purposes; ask your health care provider or pharmacist if you have questions. COMMON BRAND NAME(S): Herceptin, Herzuma, KANJINTI, Ogivri, Ontruzant, Trazimera What should I tell my health care provider before I take this medicine? They need to know if you have any of these conditions:  heart disease  heart failure  lung or breathing disease, like asthma  an unusual or allergic reaction to trastuzumab, benzyl alcohol, or other medications, foods, dyes, or preservatives  pregnant or trying to get pregnant  breast-feeding How should I use this medicine? This drug is given as an infusion into a vein. It is administered in a hospital or clinic by a specially trained health care professional. Talk to your pediatrician regarding the use of this medicine in children. This medicine is not approved for use in children. Overdosage: If you think you have taken too much of this medicine contact a poison control center or emergency room at once. NOTE: This medicine is only for you. Do not share this medicine with others. What if I miss a dose? It is important not to miss a dose. Call your doctor or health care professional if you are unable to keep an appointment. What may interact with this medicine? This medicine may interact with the following medications:  certain types of chemotherapy, such as daunorubicin, doxorubicin, epirubicin, and idarubicin This list may not describe all possible interactions. Give your health care provider a list of all the medicines, herbs, non-prescription drugs, or dietary supplements you use. Also tell them if you smoke, drink alcohol, or use illegal drugs. Some items may interact with your medicine. What should I watch for while using this medicine? Visit your  doctor for checks on your progress. Report any side effects. Continue your course of treatment even though you feel ill unless your doctor tells you to stop. Call your doctor or health care professional for advice if you get a fever, chills or sore throat, or other symptoms of a cold or flu. Do not treat yourself. Try to avoid being around people who are sick. You may experience fever, chills and shaking during your first infusion. These effects are usually mild and can be treated with other medicines. Report any side effects during the infusion to your health care professional. Fever and chills usually do not happen with later infusions. Do not become pregnant while taking this medicine or for 7 months after stopping it. Women should inform their doctor if they wish to become pregnant or think they might be pregnant. Women of child-bearing potential will need to have a negative pregnancy test before starting this medicine. There is a potential for serious side effects to an unborn child. Talk to your health care professional or pharmacist for more information. Do not breast-feed an infant while taking this medicine or for 7 months after stopping it. Women must use effective birth control with this medicine. What side effects may I notice from receiving this medicine? Side effects that you should report to your doctor or health care professional as soon as possible:  allergic reactions like skin rash, itching or hives, swelling of the face, lips, or tongue  chest pain or palpitations  cough  dizziness  feeling faint or lightheaded, falls  fever  general ill feeling or flu-like symptoms  signs of worsening heart failure like   breathing problems; swelling in your legs and feet  unusually weak or tired Side effects that usually do not require medical attention (report to your doctor or health care professional if they continue or are bothersome):  bone pain  changes in  taste  diarrhea  joint pain  nausea/vomiting  weight loss This list may not describe all possible side effects. Call your doctor for medical advice about side effects. You may report side effects to FDA at 1-800-FDA-1088. Where should I keep my medicine? This drug is given in a hospital or clinic and will not be stored at home. NOTE: This sheet is a summary. It may not cover all possible information. If you have questions about this medicine, talk to your doctor, pharmacist, or health care provider.  2021 Elsevier/Gold Standard (2016-01-20 14:37:52)  

## 2020-05-09 NOTE — Progress Notes (Unsigned)
Per BLUE-E, no PA required for ECHO CPT 93306

## 2020-05-22 ENCOUNTER — Other Ambulatory Visit: Payer: Self-pay | Admitting: Hematology and Oncology

## 2020-05-22 DIAGNOSIS — I429 Cardiomyopathy, unspecified: Secondary | ICD-10-CM

## 2020-05-28 ENCOUNTER — Other Ambulatory Visit: Payer: Self-pay

## 2020-05-28 ENCOUNTER — Telehealth: Payer: Self-pay | Admitting: Hematology and Oncology

## 2020-05-28 ENCOUNTER — Encounter: Payer: Self-pay | Admitting: Hematology and Oncology

## 2020-05-28 ENCOUNTER — Inpatient Hospital Stay: Payer: BC Managed Care – PPO

## 2020-05-28 ENCOUNTER — Inpatient Hospital Stay (INDEPENDENT_AMBULATORY_CARE_PROVIDER_SITE_OTHER): Payer: BC Managed Care – PPO | Admitting: Hematology and Oncology

## 2020-05-28 VITALS — BP 142/76 | HR 66 | Temp 98.3°F | Resp 18 | Ht 63.0 in | Wt 228.4 lb

## 2020-05-28 DIAGNOSIS — C50912 Malignant neoplasm of unspecified site of left female breast: Secondary | ICD-10-CM

## 2020-05-28 DIAGNOSIS — Z171 Estrogen receptor negative status [ER-]: Secondary | ICD-10-CM

## 2020-05-28 DIAGNOSIS — C50012 Malignant neoplasm of nipple and areola, left female breast: Secondary | ICD-10-CM

## 2020-05-28 LAB — CBC AND DIFFERENTIAL
HCT: 38 (ref 36–46)
Hemoglobin: 12.2 (ref 12.0–16.0)
Neutrophils Absolute: 3.86
Platelets: 245 (ref 150–399)
WBC: 5.6

## 2020-05-28 LAB — BASIC METABOLIC PANEL
BUN: 16 (ref 4–21)
CO2: 24 — AB (ref 13–22)
Chloride: 106 (ref 99–108)
Creatinine: 1 (ref 0.5–1.1)
Glucose: 88
Potassium: 3.7 (ref 3.4–5.3)
Sodium: 138 (ref 137–147)

## 2020-05-28 LAB — CBC
MCV: 91 (ref 81–99)
RBC: 4.12 (ref 3.87–5.11)

## 2020-05-28 LAB — COMPREHENSIVE METABOLIC PANEL
Albumin: 4.4 (ref 3.5–5.0)
Calcium: 9.7 (ref 8.7–10.7)

## 2020-05-28 LAB — HEPATIC FUNCTION PANEL
ALT: 19 (ref 7–35)
AST: 23 (ref 13–35)
Alkaline Phosphatase: 78 (ref 25–125)
Bilirubin, Total: 0.8

## 2020-05-28 NOTE — Progress Notes (Signed)
[facility]  [address] [place],    [address] [phone]  Clinic Day:  05/28/2020  Referring physician: Marlene Lard, PA-C   CHIEF COMPLAINT:  CC:   Stage IIA HER2 receptor positive breast cancer  Current Treatment:  Maintenance trastuzumab   HISTORY OF PRESENT ILLNESS:  Brandi Weaver is a 55 y.o. female with stage IIA (T0 N1 M0) HER2 receptor positive left breast cancer diagnosed in April 2021. The patient felt a mass in the left axilla in March and reported this to her primary care provider. Her last bilateral screening mammogram had been in November 2019 with a diagnostic left mammogram and ultrasound in December which revealed probable inframammary lymph node over the upper outer quadrant. Six-month follow-up was recommended. Ultrasound in March 2021 revealed multiple hypoechoic lesions of the left axilla with the largest up to 4.6 cm in diameter with another measuring 3.3 cm and another 2.6 cm. The changes were felt possibly represent lymphadenitis. Bilateral diagnostic mammogram at [facility] March revealed at least 7 enlarged intramammary and axillary lymph nodes. No primary breast lesion was found. Targeted left breast ultrasound revealed an increase in the previously demonstrated 7 mm intramammary node at 1:30 o'clock measured 13 mm with diffuse cortical thickening. There were multiple enlarged left axillary nodes with eccentric cortical thickening with a maximum thickness of 19.8 mm. Ultrasound-guided biopsy of the left axillary lymph node in April revealed metastatic carcinoma consistent with breast primary. GATA 3 was positive with negative thyroglobulin, negative Napsin A and TTF1, and the PAX8 was equivocal. Estrogen and progesterone receptors werenegative and HER 2 positive. Ki 67 was 40%. Staging CT chest, abdomen and pelvis revealed bulky left axillary lymphadenopathy extending to the sub pectoralis nodal  station with a 6 mm right upper paratracheal lymph node. There was no evidence of metastatic disease in the lungs, abdomen or pelvis. Breast MRI revealed a 1 x 1.2 oval enhancing mass within the posterior upper outer left breast felt to be an intraparenchymal lymph node, no breast masses were seen.  There was re-demonstration of the left axillary adenopathy. On physical examination the left axillary adenopathy measured approximately 9 x 7 cm. She had uterine ablation in 2011 for uterine fibroids, then experienced 1 day of uterine bleeding in 2012, but denies further bleeding.   We recommended neoadjuvant chemotherapy to include TCHP x 6 cycles with trastuzumab and pertuzumab for one full year. Unfortunately, echocardiogram on May 4th revealed ejection fraction between 40 and 45%. This was confirmed with [name] on May 10th, which revealed an ejection fraction of 42%. Based on these findings, we changed the treatment regimen to docetaxel/carboplatin/trastuzumab [facility]) and eliminated pertuzumab, due to the potential cardiotoxicities from her to targeted therapies. We also recommended repeat cardiac imaging every 6 weeks and instead of the standard every 12 weeks. She was referred to [name] and he started her on Entresto and has been monitoring her cardiac function. She reported some chest tightness to him, so he recommended a [name]. She had quit smoking. She received her 1st cycle of docetaxel, carboplatin and trastuzumab on May 14th. She has some toxicities of decreased appetite, minimal nausea and vomiting, and generalized arthralgias. Her biggest complaint was generalized achiness and hip pain, felt to be most likely due to [name]. Her pain was not controlled with hydrocodone/APAP 5/325 or 10/325, so she was given oxycodone 5 mg 1-2 tablets every 6 hours as needed for pain. She was placed on esomeprazole 40 mg  daily for acid reflux. CT imaging in May revealed bulky left axillary  lymphadenopathy, which extends to the sub pectoralis nodal station, as well as a small right upper paratracheal lymph node. No internal mammary or central thoracic metastasis identified. No evidence of metastatic disease in the abdomen or pelvis. She continues Entresto daily without difficulty. She underwent nuclear stress test at [name] office on June 8th, which revealed a fairly stable ejection fraction of 39%. She was also recommended for CT coronary angiography, which is to be scheduled at [facility]. She underwent a coronary/cardiac CT on July 8th revealed moderate CAD in proximal LAD and mild CAD in mid left main coronary artery, CADRADS = 3, coronary calcium score is 168, which places the patient in the 82 percentile for age and dex matched control, normal coronary origin with right dominance and mildly dilated main pulmonary artery, 30 mm. [name] from August 4th revealed a left ventricular ejection fraction of 39%, previously 42%in May, which was discussed with [name]. We discussed the risks and benefits with the patient again. Overall, we feel that the benefit outweighs any further risk, as long as she is closely monitored. Bilateral breast MRI from September 20th revealed smaller left axillary lymph nodes consistent with interval treatment. However, there is a persistent but smaller circumscribed oval mass in the upper outer quadrant of the left breast. We discussed this with [name] of breast radiology and we suspect this could represent the breast primary, but biopsy was negative. [name] imaging from the same day revealed a left ventricular fraction is 39%, stable. She quit smoking in April 2021.   Ultrasound guided left breast core needle biopsyinSeptember revealed lymphoid tissue with reactive lymphoid hyperplasia and was negative for granulomas or malignancy. No metastatic breast carcinoma could be seen. She then underwent left axillary lymph node dissection on  October 14th with [name]. Surgical pathology from this procedure found tumor present in 3 of 27 lymph nodes (3/27). One lymph node was positive for macrometastasis measuring 6m. Two lymph nodes had micrometastasis. There were no lymph nodes with isolated tumor cells and no extranodal tumor seen.Echocardiogram in November revealed a normal ejection fraction between 60-65% , which was a puzzle, as very different from her baseline.She was referred to physical therapy for range of motion after her surgery. She startedmaintenance trastuzumab in November.  She continued to report persistent severe left hippain, with mild pain in the right hip. Previous left hip x-ray did not reveal any abnormality.CT imaging in May also did not reveal any abnormality in the hips or lumbar spine.She has not received chemotherapy or [name] since September, so we did not feel her pain can be attributed to this.   MRI lumbar spine and left hip for further evaluation.  There was mild-to-moderate facet arthropathy at L5-S1, which was worse on the right on MRI lumbar spine.  MRI left hip was negative. Her primary care provider referred her for physical therapy.  She has continued trastuzumab every 3 weeks.  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 Echocardiogram in March revealed an ejection fraction of 45-50%, which although decreased from November, was closer to her baseline. We decided to continue with trastuzumab and plan to repeat heart function in 6 weeks instead of 12 weeks.  Our pharmacist recommended a [name] in 6 weeks instead of echocardiogram.  INTERVAL HISTORY:  LMirtieis here today for repeat clinical assessment prior to a 14th cycle of  trastuzumab. She reports persistent edema of the left breast which is not tender. She otherwise denies changes in her breasts. She denies dyspnea, chest pain or lower extremity edema. She denies fevers or chills. She  continues to report lower back pain. Physical therapy was placed on hold due to her radiation dermatitis and she has not resumed that. Her appetite is good. Her weight has been stable.  REVIEW OF SYSTEMS:  Review of Systems  Constitutional: Negative for appetite change, chills, fatigue, fever and unexpected weight change.  HENT:   Negative for lump/mass, mouth sores and sore throat.   Respiratory: Negative for cough and shortness of breath.   Cardiovascular: Negative for chest pain and leg swelling.  Gastrointestinal: Negative for abdominal pain, constipation, diarrhea, nausea and vomiting.  Genitourinary: Negative for difficulty urinating, dysuria, frequency and hematuria.   Musculoskeletal: Positive for back pain (Chronic, stable). Negative for arthralgias and myalgias.  Skin: Negative for rash.  Neurological: Negative for dizziness and headaches.  Psychiatric/Behavioral: Negative for depression and sleep disturbance. The patient is not nervous/anxious.     VITALS:  Blood pressure (!) 142/76, pulse 66, temperature 98.3 F (36.8 C), temperature source Oral, resp. rate 18, height '5\' 3"'  (1.6 m), weight 228 lb 6.4 oz (103.6 kg), SpO2 99 %.  Wt Readings from Last 3 Encounters:  05/28/20 228 lb 6.4 oz (103.6 kg)  05/09/20 226 lb 12 oz (102.9 kg)  05/07/20 228 lb 14.4 oz (103.8 kg)    Body mass index is 40.46 kg/m.  Performance status (ECOG): 0 - Asymptomatic  PHYSICAL EXAM:  Physical Exam Vitals and nursing note reviewed.  Constitutional:      General: She is not in acute distress.    Appearance: Normal appearance.  HENT:     Head: Normocephalic and atraumatic.     Mouth/Throat:     Mouth: Mucous membranes are moist.     Pharynx: Oropharynx is clear. No oropharyngeal exudate or posterior oropharyngeal erythema.  Eyes:     General: No scleral icterus.    Extraocular Movements: Extraocular movements intact.     Conjunctiva/sclera: Conjunctivae normal.     Pupils: Pupils are  equal, round, and reactive to light.  Cardiovascular:     Rate and Rhythm: Normal rate and regular rhythm.     Heart sounds: Normal heart sounds. No murmur heard. No friction rub. No gallop.   Pulmonary:     Effort: Pulmonary effort is normal.     Breath sounds: Normal breath sounds. No wheezing, rhonchi or rales.  Chest:  Breasts:     Right: Normal. No swelling, bleeding, inverted nipple, mass, nipple discharge, skin change, axillary adenopathy or supraclavicular adenopathy.     Left: Skin change (edema of the skin with hyperpigmentation due to radiation) present. No swelling, bleeding, inverted nipple, mass, nipple discharge, tenderness, axillary adenopathy or supraclavicular adenopathy.    Abdominal:     General: There is no distension.     Palpations: Abdomen is soft. There is no hepatomegaly, splenomegaly or mass.     Tenderness: There is no abdominal tenderness.  Musculoskeletal:        General: Normal range of motion.     Cervical back: Normal range of motion and neck supple. No tenderness.     Right lower leg: No edema.     Left lower leg: No edema.  Lymphadenopathy:     Cervical: No cervical adenopathy.     Upper Body:     Right upper body: No supraclavicular or axillary  adenopathy.     Left upper body: No supraclavicular or axillary adenopathy.     Lower Body: No right inguinal adenopathy. No left inguinal adenopathy.  Skin:    General: Skin is warm and dry.     Coloration: Skin is not jaundiced.     Findings: No rash.  Neurological:     Mental Status: She is alert and oriented to person, place, and time.     Cranial Nerves: No cranial nerve deficit.  Psychiatric:        Mood and Affect: Mood normal.        Behavior: Behavior normal.        Thought Content: Thought content normal.    LABS:   CBC Latest Ref Rng & Units 05/28/2020 05/07/2020 04/16/2020  WBC - 5.6 6.6 4.7  Hemoglobin 12.0 - 16.0 12.2 12.2 12.4  Hematocrit 36 - 46 38 37 38  Platelets 150 - 399 245  250 225   CMP Latest Ref Rng & Units 05/28/2020 05/07/2020 04/16/2020  Glucose 70 - 99 mg/dL - - -  BUN 4 - '21 16 16 14  ' Creatinine 0.5 - 1.1 1.0 0.9 1.0  Sodium 137 - 147 138 138 140  Potassium 3.4 - 5.3 3.7 3.6 3.5  Chloride 99 - 108 106 103 105  CO2 13 - 22 24(A) 25(A) 29(A)  Calcium 8.7 - 10.7 9.7 9.4 9.1  Total Protein 6.5 - 8.1 g/dL - - -  Total Bilirubin 0.3 - 1.2 mg/dL - - -  Alkaline Phos 25 - 125 78 82 81  AST 13 - 35 '23 25 25  ' ALT 7 - 35 '19 21 16     ' No results found for: CEA1 / No results found for: CEA1 No results found for: PSA1 No results found for: QZE092 No results found for: ZRA076  No results found for: TOTALPROTELP, ALBUMINELP, A1GS, A2GS, BETS, BETA2SER, GAMS, MSPIKE, SPEI No results found for: TIBC, FERRITIN, IRONPCTSAT No results found for: LDH  STUDIES:  No results found.  HISTORY:   Past Medical History:  Diagnosis Date  . CAD (coronary artery disease) 09/06/2019  . Cardiomyopathy ([place]) 06/26/2019  . Chest pain on exertion 10/18/2016  . Hypertension 10/18/2016  . Malignant neoplasm of left breast ([place])   . Malignant neoplasm of left female breast ([place]) 11/23/2019  . Morbid (severe) obesity due to excess calories ([place]) 06/25/2019   Formatting of this note might be different from the original. per last BMI of 41.64 on 02/13/2018  . Tobacco use disorder 10/19/2016    Past Surgical History:  Procedure Laterality Date  . ABLATION    . TUBAL LIGATION      Family History  Problem Relation Age of Onset  . Throat cancer Mother   . Hypertension Father   . Stroke Father   . Heart disease Father     Social History:  reports that she quit smoking about a year ago. Her smoking use included cigarettes. She has never used smokeless tobacco. She reports previous alcohol use. She reports that she does not use drugs.The patient is alone today.  Allergies:  Allergies  Allergen Reactions  . Sulfa Antibiotics Hives    Current Medications: Current Outpatient  Medications  Medication Sig Dispense Refill  . hypromellose (GENTEAL) 0.3 % GEL ophthalmic ointment Administer 1 drop to both eyes at bedtime.    . methimazole (TAPAZOLE) 10 MG tablet Take by mouth.    Marland Kitchen aspirin EC 81 MG tablet Take 81 mg by  mouth daily. Swallow whole.    Marland Kitchen atorvastatin (LIPITOR) 20 MG tablet Take 1 tablet (20 mg total) by mouth daily. 90 tablet 3  . cetirizine (ZYRTEC) 10 MG tablet Take 10 mg by mouth daily.    . DULoxetine (CYMBALTA) 30 MG capsule Take 30 mg by mouth 2 (two) times daily.    . hydrOXYzine (ATARAX/VISTARIL) 25 MG tablet Take 25 mg by mouth 4 (four) times daily as needed.    . loratadine (CLARITIN) 10 MG tablet Take 10 mg by mouth daily.    . nitroGLYCERIN (NITROSTAT) 0.4 MG SL tablet Place 1 tablet (0.4 mg total) under the tongue every 5 (five) minutes as needed. 25 tablet 6  . ondansetron (ZOFRAN) 4 MG tablet Take 4 mg by mouth every 4 (four) hours as needed.    . Oxycodone HCl 10 MG TABS Take 10 mg by mouth 4 (four) times daily as needed.    . prochlorperazine (COMPAZINE) 10 MG tablet Take 10 mg by mouth every 6 (six) hours as needed.    . sacubitril-valsartan (ENTRESTO) 24-26 MG Take 1 tablet by mouth 2 (two) times daily. 60 tablet 3  . traMADol (ULTRAM) 50 MG tablet Take 1 tablet (50 mg total) by mouth every 6 (six) hours as needed (pain). 60 tablet 1  . traZODone (DESYREL) 50 MG tablet Take 50 mg by mouth at bedtime as needed.    . TYLENOL 500 MG tablet Take 1,000 mg by mouth every 8 (eight) hours as needed.    . venlafaxine XR (EFFEXOR-XR) 37.5 MG 24 hr capsule Take 37.5 mg by mouth 2 (two) times daily.     No current facility-administered medications for this visit.     ASSESSMENT & PLAN:   Assessment:  1. 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 No primary breast lesion was found. She was treated with neoadjuvant docetaxel/carboplatin/trastuzumab, left axillary lymph node dissection  and adjuvant radiation therapy to the left breast and axilla.She continues maintenance trastuzumab.  2. Cardiomyopathy with decreased left ventricular ejection fraction. This is being managed by [name]. [name] from September revealed an ejection fraction of 39%, which was stable. Echocardiogramin November revealed an ejection fraction between 60-65%, which was a puzzle, as it has never been normal. Most recent echocardiogram reveals and ejection fraction of 45-50%, which is closer to her baseline. She remains without symptoms of heart failure. The patient understands that continued trastuzumab can cause worsening heart function, but this is considered reversible. She has   For treatments remaining. We will plan to continue trastuzumab every 3 weeks and obtain a [name] prior to her next infusion.  3. Bilateral hip and back pain, which is stable. MRI lumbar spine and left hip did not reveal any evidence of malignancy.  There  were degenerative changes in the lumbar spine at L5-S1.   4.  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  Plan:   We will proceed with a 14th cycle of trastuzumab this week.  I will plan to see her back in 3 weeks with a CBC, comprehensive metabolic panel and [name] prior to a 15th cycle of maintenance trastuzumab.  The patient understands the plans discussed today and is in agreement with them.  She knows to contact our office if she develops concerns prior to her next appointment.     Marvia Pickles, PA-C

## 2020-05-28 NOTE — Telephone Encounter (Signed)
Per 4/11 Staff Message, Manuela Schwartz requesting patient have a Cardia [name] Rest instead of Echo.  Receive Pre Authorization ordered for 4/25 at 2:00 pm.  Contacted patient with Appt Information

## 2020-05-28 NOTE — Telephone Encounter (Signed)
Per 4/20 LOS, patient scheduled for May Appt's.  Gave patient Appt Summary  Waiting PreAuth for Cardia [name] Rest

## 2020-05-29 ENCOUNTER — Telehealth: Payer: Self-pay | Admitting: Hematology and Oncology

## 2020-05-29 NOTE — Telephone Encounter (Signed)
Per Estill Dooms Request/Kelli, patient scheduled for 4/25 Cardia [name] Rest - Canceled 4/26 ECHO w/Angel at Dr Julien Nordmann office.  Patient aware of Appt

## 2020-05-30 ENCOUNTER — Inpatient Hospital Stay: Payer: BC Managed Care – PPO

## 2020-05-30 ENCOUNTER — Other Ambulatory Visit: Payer: Self-pay

## 2020-05-30 VITALS — BP 137/50 | HR 63 | Temp 98.7°F | Resp 18 | Ht 63.0 in | Wt 224.8 lb

## 2020-05-30 DIAGNOSIS — C50912 Malignant neoplasm of unspecified site of left female breast: Secondary | ICD-10-CM

## 2020-05-30 DIAGNOSIS — C50412 Malignant neoplasm of upper-outer quadrant of left female breast: Secondary | ICD-10-CM | POA: Diagnosis not present

## 2020-05-30 MED ORDER — SODIUM CHLORIDE 0.9 % IV SOLN
Freq: Once | INTRAVENOUS | Status: AC
Start: 2020-05-30 — End: 2020-05-30
  Filled 2020-05-30: qty 250

## 2020-05-30 MED ORDER — HEPARIN SOD (PORK) LOCK FLUSH 100 UNIT/ML IV SOLN
500.0000 [IU] | Freq: Once | INTRAVENOUS | Status: AC | PRN
  Administered 2020-05-30: 500 [IU]
  Filled 2020-05-30: qty 5

## 2020-05-30 MED ORDER — DIPHENHYDRAMINE HCL 50 MG/ML IJ SOLN: 25.0000 mg | Freq: Once | INTRAMUSCULAR | Status: DC

## 2020-05-30 MED ORDER — TRASTUZUMAB-ANNS CHEMO 150 MG IV SOLR
6.0000 mg/kg | Freq: Once | INTRAVENOUS | Status: AC
  Administered 2020-05-30: 609 mg via INTRAVENOUS
  Filled 2020-05-30: qty 29

## 2020-05-30 MED ORDER — ACETAMINOPHEN 325 MG PO TABS: 650.0000 mg | ORAL_TABLET | Freq: Once | ORAL | Status: DC

## 2020-05-30 NOTE — Progress Notes (Signed)
1202:PT STABLE AT TIME OF DISCHARGE °

## 2020-05-30 NOTE — Patient Instructions (Signed)
[facility]  Discharge Instructions: Thank you for choosing [facility] to provide your oncology and hematology care.  If you have a lab appointment with the Cancer Center, please go directly to the Cancer Center and check in at the registration area.   Wear comfortable clothing and clothing appropriate for easy access to any Portacath or PICC line.   We strive to give you quality time with your provider. You may need to reschedule your appointment if you arrive late (15 or more minutes).  Arriving late affects you and other patients whose appointments are after yours.  Also, if you miss three or more appointments without notifying the office, you may be dismissed from the clinic at the provider's discretion.      For prescription refill requests, have your pharmacy contact our office and allow 72 hours for refills to be completed.    Today you received the following chemotherapy and/or immunotherapy agents Trastuzumab     To help prevent nausea and vomiting after your treatment, we encourage you to take your nausea medication as directed.  BELOW ARE SYMPTOMS THAT SHOULD BE REPORTED IMMEDIATELY: *FEVER GREATER THAN 100.4 F (38 C) OR HIGHER *CHILLS OR SWEATING *NAUSEA AND VOMITING THAT IS NOT CONTROLLED WITH YOUR NAUSEA MEDICATION *UNUSUAL SHORTNESS OF BREATH *UNUSUAL BRUISING OR BLEEDING *URINARY PROBLEMS (pain or burning when urinating, or frequent urination) *BOWEL PROBLEMS (unusual diarrhea, constipation, pain near the anus) TENDERNESS IN MOUTH AND THROAT WITH OR WITHOUT PRESENCE OF ULCERS (sore throat, sores in mouth, or a toothache) UNUSUAL RASH, SWELLING OR PAIN  UNUSUAL VAGINAL DISCHARGE OR ITCHING   Items with * indicate a potential emergency and should be followed up as soon as possible or go to the Emergency Department if any problems should occur.  Please show the CHEMOTHERAPY ALERT CARD or IMMUNOTHERAPY ALERT CARD at check-in to the  Emergency Department and triage nurse.  Should you have questions after your visit or need to cancel or reschedule your appointment, please contact [facility]  Dept: [phone]  and follow the prompts.  Office hours are 8:00 a.m. to 4:30 p.m. Monday - Friday. Please note that voicemails left after 4:00 p.m. may not be returned until the following business day.  We are closed weekends and major holidays. You have access to a nurse at all times for urgent questions. Please call the main number to the clinic Dept: [phone] and follow the prompts.  For any non-urgent questions, you may also contact your provider using MyChart. We now offer e-Visits for anyone 18 and older to request care online for non-urgent symptoms. For details visit [email or web address].   Also download the MyChart app! Go to the app store, search "MyChart", open the app, select [facility], and log in with your MyChart username and password.  Due to Covid, a mask is required upon entering the hospital/clinic. If you do not have a mask, one will be given to you upon arrival. For doctor visits, patients may have 1 support person aged 18 or older with them. For treatment visits, patients cannot have anyone with them due to current Covid guidelines and our immunocompromised population.    

## 2020-06-03 ENCOUNTER — Encounter: Payer: Self-pay | Admitting: Hematology and Oncology

## 2020-06-03 ENCOUNTER — Other Ambulatory Visit: Payer: BC Managed Care – PPO

## 2020-06-05 ENCOUNTER — Other Ambulatory Visit: Payer: Self-pay | Admitting: Cardiology

## 2020-06-05 NOTE — Telephone Encounter (Signed)
Refill sent to pharmacy.   

## 2020-06-17 ENCOUNTER — Encounter: Payer: Self-pay | Admitting: Hematology and Oncology

## 2020-06-18 ENCOUNTER — Inpatient Hospital Stay: Payer: Medicaid Other | Attending: Hematology and Oncology

## 2020-06-18 ENCOUNTER — Other Ambulatory Visit: Payer: Self-pay

## 2020-06-18 ENCOUNTER — Encounter: Payer: Self-pay | Admitting: Hematology and Oncology

## 2020-06-18 ENCOUNTER — Inpatient Hospital Stay (INDEPENDENT_AMBULATORY_CARE_PROVIDER_SITE_OTHER): Payer: Medicaid Other | Admitting: Hematology and Oncology

## 2020-06-18 ENCOUNTER — Telehealth: Payer: Self-pay | Admitting: Hematology and Oncology

## 2020-06-18 VITALS — BP 147/72 | HR 92 | Temp 99.2°F | Resp 16 | Ht 63.0 in | Wt 231.9 lb

## 2020-06-18 DIAGNOSIS — Z79899 Other long term (current) drug therapy: Secondary | ICD-10-CM | POA: Insufficient documentation

## 2020-06-18 DIAGNOSIS — C50912 Malignant neoplasm of unspecified site of left female breast: Secondary | ICD-10-CM | POA: Diagnosis not present

## 2020-06-18 DIAGNOSIS — Z171 Estrogen receptor negative status [ER-]: Secondary | ICD-10-CM | POA: Diagnosis not present

## 2020-06-18 DIAGNOSIS — Z923 Personal history of irradiation: Secondary | ICD-10-CM | POA: Insufficient documentation

## 2020-06-18 DIAGNOSIS — I10 Essential (primary) hypertension: Secondary | ICD-10-CM | POA: Insufficient documentation

## 2020-06-18 DIAGNOSIS — C773 Secondary and unspecified malignant neoplasm of axilla and upper limb lymph nodes: Secondary | ICD-10-CM | POA: Insufficient documentation

## 2020-06-18 DIAGNOSIS — L598 Other specified disorders of the skin and subcutaneous tissue related to radiation: Secondary | ICD-10-CM | POA: Insufficient documentation

## 2020-06-18 DIAGNOSIS — Z87891 Personal history of nicotine dependence: Secondary | ICD-10-CM | POA: Insufficient documentation

## 2020-06-18 DIAGNOSIS — Z5112 Encounter for antineoplastic immunotherapy: Secondary | ICD-10-CM | POA: Insufficient documentation

## 2020-06-18 DIAGNOSIS — Z7982 Long term (current) use of aspirin: Secondary | ICD-10-CM | POA: Insufficient documentation

## 2020-06-18 DIAGNOSIS — C50412 Malignant neoplasm of upper-outer quadrant of left female breast: Secondary | ICD-10-CM | POA: Insufficient documentation

## 2020-06-18 LAB — BASIC METABOLIC PANEL
BUN: 17 (ref 4–21)
CO2: 23 — AB (ref 13–22)
Chloride: 105 (ref 99–108)
Creatinine: 1.1 (ref 0.5–1.1)
Glucose: 123
Potassium: 3.7 (ref 3.4–5.3)
Sodium: 138 (ref 137–147)

## 2020-06-18 LAB — HEPATIC FUNCTION PANEL
ALT: 19 (ref 7–35)
AST: 23 (ref 13–35)
Alkaline Phosphatase: 82 (ref 25–125)
Bilirubin, Total: 0.6

## 2020-06-18 LAB — CBC AND DIFFERENTIAL
HCT: 38 (ref 36–46)
Hemoglobin: 12.5 (ref 12.0–16.0)
Neutrophils Absolute: 5.11
Platelets: 250 (ref 150–399)
WBC: 7.4

## 2020-06-18 LAB — COMPREHENSIVE METABOLIC PANEL
Albumin: 4.4 (ref 3.5–5.0)
Calcium: 9.5 (ref 8.7–10.7)

## 2020-06-18 LAB — CBC
MCV: 92 (ref 81–99)
RBC: 4.09 (ref 3.87–5.11)

## 2020-06-18 NOTE — Progress Notes (Signed)
[facility]  [address] [place],  [place]  [address] [phone]  Clinic Day:  06/18/2020  Referring physician: Marlene Lard, PA-C   CHIEF COMPLAINT:  CC:   Stage IIA HER2 receptor positive breast cancer  Current Treatment:  Maintenance trastuzumab   HISTORY OF PRESENT ILLNESS:  Brandi Weaver is a 55 y.o. female with stage IIA (T0 N1 M0) HER2 receptor positive left breast cancer diagnosed in April 2021. The patient felt a mass in the left axilla in March and reported this to her primary care provider. Her last bilateral screening mammogram had been in November 2019 with a diagnostic left mammogram and ultrasound in December which revealed probable inframammary lymph node over the upper outer quadrant. Six-month follow-up was recommended. Ultrasound in March 2021 revealed multiple hypoechoic lesions of the left axilla with the largest up to 4.6 cm in diameter with another measuring 3.3 cm and another 2.6 cm. The changes were felt possibly represent lymphadenitis. Bilateral diagnostic mammogram at [facility] March revealed at least 7 enlarged intramammary and axillary lymph nodes. No primary breast lesion was found. Targeted left breast ultrasound revealed an increase in the previously demonstrated 7 mm intramammary node at 1:30 o'clock measured 13 mm with diffuse cortical thickening. There were multiple enlarged left axillary nodes with eccentric cortical thickening with a maximum thickness of 19.8 mm. Ultrasound-guided biopsy of the left axillary lymph node in April revealed metastatic carcinoma consistent with breast primary. GATA 3 was positive with negative thyroglobulin, negative Napsin A and TTF1, and the PAX8 was equivocal. Estrogen and progesterone receptors werenegative and HER 2 positive. Ki 67 was 40%. Staging CT chest, abdomen and pelvis revealed bulky left axillary lymphadenopathy extending to the sub pectoralis nodal  station with a 6 mm right upper paratracheal lymph node. There was no evidence of metastatic disease in the lungs, abdomen or pelvis. Breast MRI revealed a 1 x 1.2 oval enhancing mass within the posterior upper outer left breast felt to be an intraparenchymal lymph node, no breast masses were seen.  There was re-demonstration of the left axillary adenopathy. On physical examination the left axillary adenopathy measured approximately 9 x 7 cm. She had uterine ablation in 2011 for uterine fibroids, then experienced 1 day of uterine bleeding in 2012, but denies further bleeding.   We recommended neoadjuvant chemotherapy to include TCHP x 6 cycles with trastuzumab and pertuzumab for one full year. Unfortunately, echocardiogram on May 4th revealed ejection fraction between 40 and 45%. This was confirmed with [name] on May 10th, which revealed an ejection fraction of 42%. Based on these findings, we changed the treatment regimen to docetaxel/carboplatin/trastuzumab [facility]) and eliminated pertuzumab, due to the potential cardiotoxicities from her to targeted therapies. We also recommended repeat cardiac imaging every 6 weeks and instead of the standard every 12 weeks. She was referred to [name] and he started her on Entresto and has been monitoring her cardiac function. She reported some chest tightness to him, so he recommended a [name]. She had quit smoking. She received her 1st cycle of docetaxel, carboplatin and trastuzumab on May 14th. She has some toxicities of decreased appetite, minimal nausea and vomiting, and generalized arthralgias. Her biggest complaint was generalized achiness and hip pain, felt to be most likely due to [name]. Her pain was not controlled with hydrocodone/APAP 5/325 or 10/325, so she was given oxycodone 5 mg 1-2 tablets every 6 hours as needed for pain. She was placed on esomeprazole 40 mg  daily for acid reflux. CT imaging in May revealed bulky left axillary  lymphadenopathy, which extends to the sub pectoralis nodal station, as well as a small right upper paratracheal lymph node. No internal mammary or central thoracic metastasis identified. No evidence of metastatic disease in the abdomen or pelvis. She continues Entresto daily without difficulty. She underwent nuclear stress test at [name] office on June 8th, which revealed a fairly stable ejection fraction of 39%. She was also recommended for CT coronary angiography, which is to be scheduled at [facility]. She underwent a coronary/cardiac CT on July 8th revealed moderate CAD in proximal LAD and mild CAD in mid left main coronary artery, CADRADS = 3, coronary calcium score is 168, which places the patient in the 5 percentile for age and dex matched control, normal coronary origin with right dominance and mildly dilated main pulmonary artery, 30 mm. [name] from August 4th revealed a left ventricular ejection fraction of 39%, previously 42%in May, which was discussed with [name]. We discussed the risks and benefits with the patient again. Overall, we feel that the benefit outweighs any further risk, as long as she is closely monitored. Bilateral breast MRI from September 20th revealed smaller left axillary lymph nodes consistent with interval treatment. However, there is a persistent but smaller circumscribed oval mass in the upper outer quadrant of the left breast. We discussed this with [name] of breast radiology and we suspect this could represent the breast primary, but biopsy was negative. [name] imaging from the same day revealed a left ventricular fraction is 39%, stable. She quit smoking in April 2021.   Ultrasound guided left breast core needle biopsyinSeptember revealed lymphoid tissue with reactive lymphoid hyperplasia and was negative for granulomas or malignancy. No metastatic breast carcinoma could be seen. She then underwent left axillary lymph node dissection on  October 14th with [name]. Surgical pathology from this procedure found tumor present in 3 of 27 lymph nodes (3/27). One lymph node was positive for macrometastasis measuring 38m. Two lymph nodes had micrometastasis. There were no lymph nodes with isolated tumor cells and no extranodal tumor seen.Echocardiogram in November revealed a normal ejection fraction between 60-65% , which was a puzzle, as very different from her baseline.She was referred to physical therapy for range of motion after her surgery. She startedmaintenance trastuzumab in November.  She continued to report persistent severe left hippain, with mild pain in the right hip. Previous left hip x-ray did not reveal any abnormality.CT imaging in May also did not reveal any abnormality in the hips or lumbar spine.She has not received chemotherapy or [name] since September, so we did not feel her pain can be attributed to this.   MRI lumbar spine and left hip for further evaluation.  There was mild-to-moderate facet arthropathy at L5-S1, which was worse on the right on MRI lumbar spine.  MRI left hip was negative. Her primary care provider referred her for physical therapy.  She has continued trastuzumab every 3 weeks.  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 Echocardiogram in March revealed an ejection fraction of 45-50%, which although decreased from November, was closer to her baseline. We decided to continue with trastuzumab and plan to repeat heart function in 6 weeks instead of 12 weeks.  Our pharmacist recommended a [name] in 6 weeks instead of echocardiogram.  INTERVAL HISTORY:  LRaiganis here today for repeat clinical assessment prior to a 15th cycle of  trastuzumab. She reports persistent edema of the left breast which is not tender. She otherwise denies changes in her breasts. She denies dyspnea, chest pain or lower extremity edema. She denies fevers or chills. She  continues to report lower back pain. Physical therapy was placed on hold due to her radiation dermatitis and she has not resumed that. Her appetite is good. Her weight has been stable. Since her last visit she had a repeat [name] cardiac scan.  REVIEW OF SYSTEMS:  Review of Systems  Constitutional: Negative for appetite change, chills, fatigue, fever and unexpected weight change.  HENT:   Negative for lump/mass, mouth sores and sore throat.   Respiratory: Negative for cough and shortness of breath.   Cardiovascular: Negative for chest pain and leg swelling.  Gastrointestinal: Negative for abdominal pain, constipation, diarrhea, nausea and vomiting.  Genitourinary: Negative for difficulty urinating, dysuria, frequency and hematuria.   Musculoskeletal: Positive for back pain (Chronic, stable). Negative for arthralgias and myalgias.  Skin: Negative for rash.  Neurological: Negative for dizziness and headaches.  Psychiatric/Behavioral: Negative for depression and sleep disturbance. The patient is not nervous/anxious.     VITALS:  Blood pressure (!) 147/72, pulse 92, temperature 99.2 F (37.3 C), resp. rate 16, height '5\' 3"'  (1.6 m), weight 231 lb 14.4 oz (105.2 kg), SpO2 99 %.  Wt Readings from Last 3 Encounters:  06/18/20 231 lb 14.4 oz (105.2 kg)  05/30/20 224 lb 12 oz (101.9 kg)  05/28/20 228 lb 6.4 oz (103.6 kg)    Body mass index is 41.08 kg/m.  Performance status (ECOG): 0 - Asymptomatic  PHYSICAL EXAM:  Physical Exam Vitals and nursing note reviewed.  Constitutional:      General: She is not in acute distress.    Appearance: Normal appearance.  HENT:     Head: Normocephalic and atraumatic.     Mouth/Throat:     Mouth: Mucous membranes are moist.     Pharynx: Oropharynx is clear. No oropharyngeal exudate or posterior oropharyngeal erythema.  Eyes:     General: No scleral icterus.    Extraocular Movements: Extraocular movements intact.     Conjunctiva/sclera: Conjunctivae  normal.     Pupils: Pupils are equal, round, and reactive to light.  Cardiovascular:     Rate and Rhythm: Normal rate and regular rhythm.     Heart sounds: Normal heart sounds. No murmur heard. No friction rub. No gallop.   Pulmonary:     Effort: Pulmonary effort is normal.     Breath sounds: Normal breath sounds. No wheezing, rhonchi or rales.  Chest:  Breasts:     Right: Normal. No swelling, bleeding, inverted nipple, mass, nipple discharge, skin change, axillary adenopathy or supraclavicular adenopathy.     Left: Skin change (edema of the skin with hyperpigmentation due to radiation) present. No bleeding, inverted nipple, mass, nipple discharge, tenderness, axillary adenopathy or supraclavicular adenopathy.    Abdominal:     General: There is no distension.     Palpations: Abdomen is soft. There is no hepatomegaly, splenomegaly or mass.     Tenderness: There is no abdominal tenderness.  Musculoskeletal:        General: Normal range of motion.     Cervical back: Normal range of motion and neck supple. No tenderness.     Right lower leg: No edema.     Left lower leg: No edema.  Lymphadenopathy:     Cervical: No cervical adenopathy.     Upper Body:  Right upper body: No supraclavicular or axillary adenopathy.     Left upper body: No supraclavicular or axillary adenopathy.     Lower Body: No right inguinal adenopathy. No left inguinal adenopathy.  Skin:    General: Skin is warm and dry.     Coloration: Skin is not jaundiced.     Findings: No rash.  Neurological:     Mental Status: She is alert and oriented to person, place, and time.     Cranial Nerves: No cranial nerve deficit.  Psychiatric:        Mood and Affect: Mood normal.        Behavior: Behavior normal.        Thought Content: Thought content normal.    LABS:   CBC Latest Ref Rng & Units 06/18/2020 05/28/2020 05/07/2020  WBC - 7.4 5.6 6.6  Hemoglobin 12.0 - 16.0 12.5 12.2 12.2  Hematocrit 36 - 46 38 38 37   Platelets 150 - 399 250 245 250   CMP Latest Ref Rng & Units 06/18/2020 05/28/2020 05/07/2020  Glucose 70 - 99 mg/dL - - -  BUN 4 - '21 17 16 16  ' Creatinine 0.5 - 1.1 1.1 1.0 0.9  Sodium 137 - 147 138 138 138  Potassium 3.4 - 5.3 3.7 3.7 3.6  Chloride 99 - 108 105 106 103  CO2 13 - 22 23(A) 24(A) 25(A)  Calcium 8.7 - 10.7 9.5 9.7 9.4  Total Protein 6.5 - 8.1 g/dL - - -  Total Bilirubin 0.3 - 1.2 mg/dL - - -  Alkaline Phos 25 - 125 82 78 82  AST 13 - 35 '23 23 25  ' ALT 7 - 35 '19 19 21     ' No results found for: CEA1 / No results found for: CEA1 No results found for: PSA1 No results found for: VHS929 No results found for: GRM301  No results found for: TOTALPROTELP, ALBUMINELP, A1GS, A2GS, BETS, BETA2SER, GAMS, MSPIKE, SPEI No results found for: TIBC, FERRITIN, IRONPCTSAT No results found for: LDH  STUDIES:  No results found.  Exam(s): [id number] NM/NM CARDIA [name] REST CLINICAL DATA:  55 year old female with a history of breast cancer on chemotherapy.  EXAM: NUCLEAR MEDICINE CARDIAC BLOOD POOL IMAGING ([name])  TECHNIQUE: Cardiac multi-gated acquisition was performed at rest following intravenous injection of Tc-74mlabeled red blood cells.  RADIOPHARMACEUTICALS:  25.2 mCi Tc-954mertechnetate in-vitro labeled red blood cells IV  COMPARISON:  Prior [name] 06/20/2019; 09/12/2019; 10/29/2019  FINDINGS: Cardiac multigated acquisition performed at rest shows no evidence of regional left ventricular wall motion abnormality.  Heart rate is 65 beats per minute. The left ventricular end-diastolic volume is 20499ubic cm while the end systolic volume is 11692ubic cm yielding an estimated ejection fraction of 41%.  IMPRESSION: Slightly improved left ventricular ejection fraction at 41%.  HISTORY:   Past Medical History:  Diagnosis Date  . CAD (coronary artery disease) 09/06/2019  . [place]omy[place] ([place]18[place]  . Chest pain on exertion 10/18/2016  . Hypertension 10/18/2016  .  Malignant neoplasm of left breast ([facility]  . Malignant neoplasm of left female breast ([facility]10/15/2021  . Morbid (severe) obesity due to excess calories ([facility]5/17/2021   Formatting of this note might be different from the original. per last BMI of 41.64 on 02/13/2018  . Tobacco use disorder 10/19/2016    Past Surgical History:  Procedure Laterality Date  . ABLATION    . TUBAL LIGATION      Family History  Problem  Relation Age of Onset  . Throat cancer Mother   . Hypertension Father   . Stroke Father   . Heart disease Father     Social History:  reports that she quit smoking about a year ago. Her smoking use included cigarettes. She has never used smokeless tobacco. She reports previous alcohol use. She reports that she does not use drugs.The patient is alone today.  Allergies:  Allergies  Allergen Reactions  . Sulfa Antibiotics Hives    Current Medications: Current Outpatient Medications  Medication Sig Dispense Refill  . aspirin EC 81 MG tablet Take 81 mg by mouth daily. Swallow whole.    Marland Kitchen atorvastatin (LIPITOR) 20 MG tablet Take 1 tablet (20 mg total) by mouth daily. 90 tablet 3  . cetirizine (ZYRTEC) 10 MG tablet Take 10 mg by mouth daily.    . DULoxetine (CYMBALTA) 30 MG capsule Take 30 mg by mouth 2 (two) times daily.    . hydrOXYzine (ATARAX/VISTARIL) 25 MG tablet Take 25 mg by mouth 4 (four) times daily as needed.    . hypromellose (GENTEAL) 0.3 % GEL ophthalmic ointment Administer 1 drop to both eyes at bedtime.    Marland Kitchen loratadine (CLARITIN) 10 MG tablet Take 10 mg by mouth daily.    . methimazole (TAPAZOLE) 10 MG tablet Take by mouth.    . nitroGLYCERIN (NITROSTAT) 0.4 MG SL tablet PLACE 1 TABLET UNDER THE TONGUE EVERY 5 (FIVE) MINUTES AS NEEDED. 75 tablet 2  . ondansetron (ZOFRAN) 4 MG tablet Take 4 mg by mouth every 4 (four) hours as needed.    . Oxycodone HCl 10 MG TABS Take 10 mg by mouth 4 (four) times daily as needed.    . prochlorperazine (COMPAZINE) 10 MG  tablet Take 10 mg by mouth every 6 (six) hours as needed.    . sacubitril-valsartan (ENTRESTO) 24-26 MG Take 1 tablet by mouth 2 (two) times daily. 60 tablet 3  . traMADol (ULTRAM) 50 MG tablet Take 1 tablet (50 mg total) by mouth every 6 (six) hours as needed (pain). 60 tablet 1  . traZODone (DESYREL) 50 MG tablet Take 50 mg by mouth at bedtime as needed.    . TYLENOL 500 MG tablet Take 1,000 mg by mouth every 8 (eight) hours as needed.    . venlafaxine XR (EFFEXOR-XR) 37.5 MG 24 hr capsule Take 37.5 mg by mouth 2 (two) times daily.     No current facility-administered medications for this visit.     ASSESSMENT & PLAN:   Assessment:  1. 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 No primary breast lesion was found. She was treated with neoadjuvant docetaxel/carboplatin/trastuzumab, left axillary lymph node dissection and adjuvant radiation therapy to the left breast and axilla.She continues maintenance trastuzumab. I will order an annual mammogram at this time.  2. Cardiomyopathy with decreased left ventricular ejection fraction. This is being managed by [name]. [name] from September revealed an ejection fraction of 39%, which was stable. Echocardiogramin November revealed an ejection fraction between 60-65%, which was a puzzle, as it has never been normal. Most recent echocardiogram reveals and ejection fraction of 45-50%, which is closer to her baseline. She remains without symptoms of heart failure. The patient understands that continued trastuzumab can cause worsening heart function, but this is considered reversible. She has 4 treatments remaining. Recent [name] reveals slightly improved left ventricular function.  3. Bilateral hip and back pain, which is stable. MRI lumbar  spine and left hip did not reveal any evidence of malignancy.  There  were degenerative changes in the lumbar spine at L5-S1.   4.  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  Plan:   We will proceed with a 15th cycle of trastuzumab this week. She is due for annual mammogram, which I will schedule.  I will plan to see her back in 3 weeks with a CBC and comprehensive metabolic panel prior to a 16th cycle of maintenance trastuzumab.  The patient understands the plans discussed today and is in agreement with them.  She knows to contact our office if she develops concerns prior to her next appointment.     Marvia Pickles, PA-C

## 2020-06-18 NOTE — Telephone Encounter (Signed)
Per 5/11 LOS, patient scheduled for June Appt's/Mammo. Gave patient Appt Summary - Called w/Mammo Appt

## 2020-06-20 ENCOUNTER — Other Ambulatory Visit: Payer: Self-pay

## 2020-06-20 ENCOUNTER — Inpatient Hospital Stay: Payer: Medicaid Other

## 2020-06-20 VITALS — BP 138/86 | HR 81 | Temp 98.1°F | Resp 18 | Ht 63.0 in | Wt 230.5 lb

## 2020-06-20 DIAGNOSIS — C50412 Malignant neoplasm of upper-outer quadrant of left female breast: Secondary | ICD-10-CM | POA: Diagnosis not present

## 2020-06-20 DIAGNOSIS — I10 Essential (primary) hypertension: Secondary | ICD-10-CM | POA: Diagnosis not present

## 2020-06-20 DIAGNOSIS — Z7982 Long term (current) use of aspirin: Secondary | ICD-10-CM | POA: Diagnosis not present

## 2020-06-20 DIAGNOSIS — C50912 Malignant neoplasm of unspecified site of left female breast: Secondary | ICD-10-CM

## 2020-06-20 DIAGNOSIS — Z923 Personal history of irradiation: Secondary | ICD-10-CM | POA: Diagnosis not present

## 2020-06-20 DIAGNOSIS — C773 Secondary and unspecified malignant neoplasm of axilla and upper limb lymph nodes: Secondary | ICD-10-CM | POA: Diagnosis present

## 2020-06-20 DIAGNOSIS — Z79899 Other long term (current) drug therapy: Secondary | ICD-10-CM | POA: Diagnosis not present

## 2020-06-20 DIAGNOSIS — Z87891 Personal history of nicotine dependence: Secondary | ICD-10-CM | POA: Diagnosis not present

## 2020-06-20 DIAGNOSIS — L598 Other specified disorders of the skin and subcutaneous tissue related to radiation: Secondary | ICD-10-CM | POA: Diagnosis not present

## 2020-06-20 DIAGNOSIS — Z5112 Encounter for antineoplastic immunotherapy: Secondary | ICD-10-CM | POA: Diagnosis present

## 2020-06-20 MED ORDER — SODIUM CHLORIDE 0.9% FLUSH
10.0000 mL | INTRAVENOUS | Status: DC | PRN
  Filled 2020-06-20: qty 10

## 2020-06-20 MED ORDER — DIPHENHYDRAMINE HCL 50 MG/ML IJ SOLN
INTRAMUSCULAR | Status: AC
  Filled 2020-06-20: qty 1

## 2020-06-20 MED ORDER — SODIUM CHLORIDE 0.9 % IV SOLN
Freq: Once | INTRAVENOUS | Status: AC
Start: 2020-06-20 — End: 2020-06-20
  Filled 2020-06-20: qty 250

## 2020-06-20 MED ORDER — DIPHENHYDRAMINE HCL 50 MG/ML IJ SOLN
25.0000 mg | Freq: Once | INTRAMUSCULAR | Status: AC
Start: 2020-06-20 — End: 2020-06-20
  Administered 2020-06-20: 25 mg via INTRAVENOUS

## 2020-06-20 MED ORDER — TRASTUZUMAB-ANNS CHEMO 150 MG IV SOLR
6.0000 mg/kg | Freq: Once | INTRAVENOUS | Status: AC
  Administered 2020-06-20: 609 mg via INTRAVENOUS
  Filled 2020-06-20: qty 29

## 2020-06-20 MED ORDER — HEPARIN SOD (PORK) LOCK FLUSH 100 UNIT/ML IV SOLN
500.0000 [IU] | Freq: Once | INTRAVENOUS | Status: DC | PRN
  Filled 2020-06-20: qty 5

## 2020-06-20 MED ORDER — ACETAMINOPHEN 325 MG PO TABS: 650.0000 mg | ORAL_TABLET | Freq: Once | ORAL | Status: DC

## 2020-06-20 NOTE — Patient Instructions (Signed)
Trastuzumab injection for infusion What is this medicine? TRASTUZUMAB (tras TOO zoo mab) is a monoclonal antibody. It is used to treat breast cancer and stomach cancer. This medicine may be used for other purposes; ask your health care provider or pharmacist if you have questions. COMMON BRAND NAME(S): Herceptin, Herzuma, KANJINTI, Ogivri, Ontruzant, Trazimera What should I tell my health care provider before I take this medicine? They need to know if you have any of these conditions:  heart disease  heart failure  lung or breathing disease, like asthma  an unusual or allergic reaction to trastuzumab, benzyl alcohol, or other medications, foods, dyes, or preservatives  pregnant or trying to get pregnant  breast-feeding How should I use this medicine? This drug is given as an infusion into a vein. It is administered in a hospital or clinic by a specially trained health care professional. Talk to your pediatrician regarding the use of this medicine in children. This medicine is not approved for use in children. Overdosage: If you think you have taken too much of this medicine contact a poison control center or emergency room at once. NOTE: This medicine is only for you. Do not share this medicine with others. What if I miss a dose? It is important not to miss a dose. Call your doctor or health care professional if you are unable to keep an appointment. What may interact with this medicine? This medicine may interact with the following medications:  certain types of chemotherapy, such as daunorubicin, doxorubicin, epirubicin, and idarubicin This list may not describe all possible interactions. Give your health care provider a list of all the medicines, herbs, non-prescription drugs, or dietary supplements you use. Also tell them if you smoke, drink alcohol, or use illegal drugs. Some items may interact with your medicine. What should I watch for while using this medicine? Visit your  doctor for checks on your progress. Report any side effects. Continue your course of treatment even though you feel ill unless your doctor tells you to stop. Call your doctor or health care professional for advice if you get a fever, chills or sore throat, or other symptoms of a cold or flu. Do not treat yourself. Try to avoid being around people who are sick. You may experience fever, chills and shaking during your first infusion. These effects are usually mild and can be treated with other medicines. Report any side effects during the infusion to your health care professional. Fever and chills usually do not happen with later infusions. Do not become pregnant while taking this medicine or for 7 months after stopping it. Women should inform their doctor if they wish to become pregnant or think they might be pregnant. Women of child-bearing potential will need to have a negative pregnancy test before starting this medicine. There is a potential for serious side effects to an unborn child. Talk to your health care professional or pharmacist for more information. Do not breast-feed an infant while taking this medicine or for 7 months after stopping it. Women must use effective birth control with this medicine. What side effects may I notice from receiving this medicine? Side effects that you should report to your doctor or health care professional as soon as possible:  allergic reactions like skin rash, itching or hives, swelling of the face, lips, or tongue  chest pain or palpitations  cough  dizziness  feeling faint or lightheaded, falls  fever  general ill feeling or flu-like symptoms  signs of worsening heart failure like   breathing problems; swelling in your legs and feet  unusually weak or tired Side effects that usually do not require medical attention (report to your doctor or health care professional if they continue or are bothersome):  bone pain  changes in  taste  diarrhea  joint pain  nausea/vomiting  weight loss This list may not describe all possible side effects. Call your doctor for medical advice about side effects. You may report side effects to FDA at 1-800-FDA-1088. Where should I keep my medicine? This drug is given in a hospital or clinic and will not be stored at home. NOTE: This sheet is a summary. It may not cover all possible information. If you have questions about this medicine, talk to your doctor, pharmacist, or health care provider.  2021 Elsevier/Gold Standard (2016-01-20 14:37:52)  

## 2020-06-27 DIAGNOSIS — E079 Disorder of thyroid, unspecified: Secondary | ICD-10-CM

## 2020-06-27 HISTORY — DX: Disorder of thyroid, unspecified: E07.9

## 2020-07-01 ENCOUNTER — Telehealth: Payer: Self-pay

## 2020-07-01 NOTE — Telephone Encounter (Signed)
   Name: Brandi Weaver  DOB: 03-13-1965  MRN: [id number]  Primary Cardiologist: None  Chart reviewed as part of pre-operative protocol coverage. Because of [name]'s past medical history and time since last visit, she will require a follow-up visit in order to better assess preoperative cardiovascular risk.  Pre-op covering staff: - Please schedule appointment and call patient to inform them. If patient already had an upcoming appointment within acceptable timeframe, please add "pre-op clearance" to the appointment notes so provider is aware. - Please contact requesting surgeon's office via preferred method (i.e, phone, fax) to inform them of need for appointment prior to surgery.   Richardson Dopp, PA-C  07/01/2020, 1:03 PM

## 2020-07-01 NOTE — Telephone Encounter (Signed)
   [place] Pre-operative Risk Assessment    Patient Name: Zuley Lutter  DOB: 1965-10-06  MRN: [id number]   HEARTCARE STAFF: - Please ensure there is not already an duplicate clearance open for this procedure. - Under Visit Info/Reason for Call, type in Other and utilize the format Clearance MM/DD/YY or Clearance TBD. Do not use dashes or single digits. - If request is for dental extraction, please clarify the # of teeth to be extracted.  Request for surgical clearance:  1. What type of surgery is being performed? Orbital decompression and bilateral canthopexy 6 hours   2. When is this surgery scheduled? TBD   3. What type of clearance is required (medical clearance vs. Pharmacy clearance to hold med vs. Both)? Both  4. Are there any medications that need to be held prior to surgery and how long? Aspirin   5. Practice name and name of physician performing surgery? [facility]- Dr. Domenic Moras   6. What is the office phone number? [phone]   7.   What is the office fax number? [phone]  8.   Anesthesia type (None, local, MAC, general) ?    Lowella Grip 07/01/2020, 11:19 AM  _________________________________________________________________   (provider comments below)

## 2020-07-01 NOTE — Telephone Encounter (Signed)
S/w pt and she is agreeable to plan of care for pre op appt. Pt states she is agreeable to appt 08/05/20 with Berniece Salines, DO @ 2:20. Pt states her surgery is not until sometime in July. Pt states surgeon wants her to finish her chemo before the eye surgery. Pt is grateful for the help and the call. I will send clearance notes to DO for upcoming appt as well as FYI to surgeon's office.

## 2020-07-03 NOTE — Telephone Encounter (Signed)
   Patient Name: Brandi Weaver  DOB: [birth year]  MRN: [id number]   Primary Cardiologist: None  Chart reviewed as part of pre-operative protocol coverage. Patient has been scheduled for pre-operative cardiac risk stratification visit on on 08/05/2020. The requesting office has been updated with the above. Will remove from the pre-op pool, as risk stratification will be addressed at her visit and no further APP input is needed.    Christell Faith, PA-C 07/03/2020, 9:37 AM

## 2020-07-09 ENCOUNTER — Inpatient Hospital Stay (INDEPENDENT_AMBULATORY_CARE_PROVIDER_SITE_OTHER): Payer: Medicaid Other | Admitting: Hematology and Oncology

## 2020-07-09 ENCOUNTER — Inpatient Hospital Stay: Payer: Medicaid Other | Attending: Hematology and Oncology

## 2020-07-09 ENCOUNTER — Telehealth: Payer: Self-pay | Admitting: Hematology and Oncology

## 2020-07-09 ENCOUNTER — Other Ambulatory Visit: Payer: Self-pay

## 2020-07-09 VITALS — BP 145/85 | HR 67 | Temp 98.7°F | Resp 16 | Ht 63.0 in | Wt 229.3 lb

## 2020-07-09 DIAGNOSIS — Z87891 Personal history of nicotine dependence: Secondary | ICD-10-CM | POA: Insufficient documentation

## 2020-07-09 DIAGNOSIS — C50912 Malignant neoplasm of unspecified site of left female breast: Secondary | ICD-10-CM

## 2020-07-09 DIAGNOSIS — Z171 Estrogen receptor negative status [ER-]: Secondary | ICD-10-CM | POA: Diagnosis not present

## 2020-07-09 DIAGNOSIS — Z79899 Other long term (current) drug therapy: Secondary | ICD-10-CM | POA: Insufficient documentation

## 2020-07-09 DIAGNOSIS — I10 Essential (primary) hypertension: Secondary | ICD-10-CM | POA: Insufficient documentation

## 2020-07-09 DIAGNOSIS — L598 Other specified disorders of the skin and subcutaneous tissue related to radiation: Secondary | ICD-10-CM | POA: Insufficient documentation

## 2020-07-09 DIAGNOSIS — C50012 Malignant neoplasm of nipple and areola, left female breast: Secondary | ICD-10-CM

## 2020-07-09 DIAGNOSIS — Z170 Estrogen receptor positive status [ER+]: Secondary | ICD-10-CM | POA: Insufficient documentation

## 2020-07-09 DIAGNOSIS — C773 Secondary and unspecified malignant neoplasm of axilla and upper limb lymph nodes: Secondary | ICD-10-CM | POA: Insufficient documentation

## 2020-07-09 DIAGNOSIS — Z7982 Long term (current) use of aspirin: Secondary | ICD-10-CM | POA: Insufficient documentation

## 2020-07-09 DIAGNOSIS — Z5112 Encounter for antineoplastic immunotherapy: Secondary | ICD-10-CM | POA: Insufficient documentation

## 2020-07-09 DIAGNOSIS — C50412 Malignant neoplasm of upper-outer quadrant of left female breast: Secondary | ICD-10-CM | POA: Insufficient documentation

## 2020-07-09 LAB — CBC
MCV: 92 (ref 81–99)
RBC: 4.08 (ref 3.87–5.11)

## 2020-07-09 LAB — BASIC METABOLIC PANEL
BUN: 14 (ref 4–21)
CO2: 24 — AB (ref 13–22)
Chloride: 107 (ref 99–108)
Creatinine: 1 (ref 0.5–1.1)
Glucose: 113
Potassium: 3.6 (ref 3.4–5.3)
Sodium: 140 (ref 137–147)

## 2020-07-09 LAB — COMPREHENSIVE METABOLIC PANEL
Albumin: 4.4 (ref 3.5–5.0)
Calcium: 9.7 (ref 8.7–10.7)

## 2020-07-09 LAB — CBC AND DIFFERENTIAL
HCT: 38 (ref 36–46)
Hemoglobin: 12.4 (ref 12.0–16.0)
Neutrophils Absolute: 4.49
Platelets: 236 (ref 150–399)
WBC: 6.7

## 2020-07-09 LAB — HEPATIC FUNCTION PANEL
ALT: 18 (ref 7–35)
AST: 31 (ref 13–35)
Alkaline Phosphatase: 76 (ref 25–125)
Bilirubin, Total: 0.7

## 2020-07-09 NOTE — Telephone Encounter (Signed)
Per 6/1 LOS, patient scheduled for 6/22, 6/24 Appt's.  Gave patient Appt Summary

## 2020-07-09 NOTE — Progress Notes (Signed)
[facility]  [address] [place],  [place]  [address] [phone]  Clinic Day:  07/09/2020  Referring physician: Marlene Lard, PA-C   CHIEF COMPLAINT:  CC:   Stage IIA HER2 receptor positive breast cancer  Current Treatment:  Maintenance trastuzumab  HISTORY OF PRESENT ILLNESS:  Brandi Weaver is a 55 y.o. female with stage IIA (T0 N1 M0) HER2 receptor positive left breast cancer diagnosed in April 2021. The patient felt a mass in the left axilla in March and reported this to her primary care provider. Her last bilateral screening mammogram had been in November 2019 with a diagnostic left mammogram and ultrasound in December which revealed probable inframammary lymph node over the upper outer quadrant. Six-month follow-up was recommended. Ultrasound in March 2021 revealed multiple hypoechoic lesions of the left axilla with the largest up to 4.6 cm in diameter with another measuring 3.3 cm and another 2.6 cm. The changes were felt possibly represent lymphadenitis. Bilateral diagnostic mammogram at [facility] March revealed at least 7 enlarged intramammary and axillary lymph nodes. No primary breast lesion was found. Targeted left breast ultrasound revealed an increase in the previously demonstrated 7 mm intramammary node at 1:30 o'clock measured 13 mm with diffuse cortical thickening. There were multiple enlarged left axillary nodes with eccentric cortical thickening with a maximum thickness of 19.8 mm. Ultrasound-guided biopsy of the left axillary lymph node in April revealed metastatic carcinoma consistent with breast primary. GATA 3 was positive with negative thyroglobulin, negative Napsin A and TTF1, and the PAX8 was equivocal. Estrogen and progesterone receptors werenegative and HER 2 positive. Ki 67 was 40%. Staging CT chest, abdomen and pelvis revealed bulky left axillary lymphadenopathy extending to the sub pectoralis nodal  station with a 6 mm right upper paratracheal lymph node. There was no evidence of metastatic disease in the lungs, abdomen or pelvis. Breast MRI revealed a 1 x 1.2 oval enhancing mass within the posterior upper outer left breast felt to be an intraparenchymal lymph node, no breast masses were seen.  There was re-demonstration of the left axillary adenopathy. On physical examination the left axillary adenopathy measured approximately 9 x 7 cm. She had uterine ablation in 2011 for uterine fibroids, then experienced 1 day of uterine bleeding in 2012, but denies further bleeding.   We recommended neoadjuvant chemotherapy to include TCHP x 6 cycles with trastuzumab and pertuzumab for one full year. Unfortunately, echocardiogram  In May revealed ejection fraction between 40 and 45%. This was confirmed with [name] on May 10th, which revealed an ejection fraction of 42%. Based on these findings, we changed the treatment regimen to docetaxel/carboplatin/trastuzumab [facility]) and eliminated pertuzumab, due to the potential cardiotoxicities from HER2 targeted therapies. We also recommended repeat cardiac imaging every 6 weeks and instead of the standard every 12 weeks. She was referred to [name] and he started her on Entresto and has been monitoring her cardiac function. She reported some chest tightness to him, so he recommended a [name]. She had quit smoking. She received her 1st cycle of docetaxel, carboplatin and trastuzumab in May. She experienced toxicities of decreased appetite, minimal nausea and vomiting, and generalized arthralgias. Her biggest complaint was generalized achiness and hip pain, felt to be most likely due to [name]. Her pain was not controlled with hydrocodone/APAP 5/325 or 10/325, so she was given oxycodone 5 mg 1-2 tablets every 6 hours as needed for pain. She was placed on esomeprazole 40 mg daily for acid reflux.  She quit smoking in April 2021.  She continued  Entresto daily. She underwent nuclear stress test at [name] office in June, which revealed a fairly stable ejection fraction of 39%. She was also recommended for CT coronary angiography, which is to be scheduled at [facility]. She underwent a coronary/cardiac CT in July revealed moderate CAD in proximal LAD and mild CAD in mid left main coronary artery, CADRADS = 3, coronary calcium score is 168, which places the patient in the 71 percentile for age and dex matched control, normal coronary origin with right dominance and mildly dilated main pulmonary artery, 30 mm. [name] from August revealed a left ventricular ejection fraction of 39%, previously 42% in May, which was discussed with [name]. We discussed the risks and benefits with the patient again. Overall, we feel that the benefit outweighs any further risk, as long as she is closely monitored. Bilateral breast MRI in September revealed a decrease in the left axillary lymphadenopathy. However, there was a persistent, but smaller circumscribed oval mass in the upper outer quadrant of the left breast. We discussed this with [name] of breast radiology and we suspect this could represent the breast primary, but biopsy was negative. [name] imaging in September revealed a left ventricular fraction is 39%, which was stable stable.   Ultrasound guided left breast core needle biopsyinSeptember revealed lymphoid tissue with reactive lymphoid hyperplasia and was negative for granulomas or malignancy. No metastatic breast carcinoma could be seen. She then underwent left axillary lymph node dissection on October 14th with [name]. Surgical pathology from this procedure found tumor present in 3 of 27 lymph nodes (3/27). One lymph node was positive for macrometastasis measuring 21m. Two lymph nodes had micrometastasis. There were no lymph nodes with isolated tumor cells and no extranodal tumor seen.Echocardiogram in November revealed a  normal ejection fraction between 60-65% , which was a puzzle, as very different from her baseline.She was referred to physical therapy for range of motion after her surgery. She startedmaintenance trastuzumab in November.  She continued to report persistent severe left hippain, with mild pain in the right hip. Previous left hip x-ray did not reveal any abnormality.CT imaging in May also had not revealed any abnormality in the hips or lumbar spine.She had not received chemotherapy or [name] since September, so we did not feel her pain can be attributed to this. MRI lumbar spine and left hip revealed mild-to-moderate facet arthropathy at L5-S1, which was worse on the right.  MRI left hip was negative. Her primary care provider referred her for physical therapy.  She has continued trastuzumab every 3 weeks.  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 Echocardiogram in March revealed an ejection fraction of 45-50%, which although decreased from November, was closer to her baseline. We decided to continue with trastuzumab and plan to repeat echocardiogram in 6 weeks.  Our pharmacist recommended a [name] in 6 weeks instead of echocardiogram. [name] scan on April 25th revealed a slight increase in the ejection fraction at 41%, so we have continued trastuzumab every 3 weeks.  INTERVAL HISTORY:  Brandi Weaver here today for repeat clinical assessment prior to a 16th cycle of trastuzumab. She reports persistent edema and hype pigmentation of the left breast, which is not tender. She otherwise denies changes in her breasts. She denies dyspnea, chest pain or lower extremity edema. She denies fevers or chills. She continues to report lower back  pain, which is stable. Physical therapy was placed on hold due to her radiation dermatitis and she has not resumed that. Her appetite is good. Her weight has been stable.Her mammogram is scheduled for  June 8th.  She is hoping to undergo a procedure for her thyroid eye disease.  She has to see the cardiologist prior to that procedure.  REVIEW OF SYSTEMS:  Review of Systems  Constitutional: Negative for appetite change, chills, fatigue, fever and unexpected weight change.  HENT:   Negative for lump/mass, mouth sores and sore throat.   Respiratory: Negative for cough and shortness of breath.   Cardiovascular: Negative for chest pain and leg swelling.  Gastrointestinal: Negative for abdominal pain, constipation, diarrhea, nausea and vomiting.  Endocrine: Negative for hot flashes.  Genitourinary: Negative for difficulty urinating, dysuria, frequency and hematuria.   Musculoskeletal: Positive for back pain (Chronic, stable). Negative for arthralgias and myalgias.  Skin: Negative for rash.  Neurological: Negative for dizziness and headaches.  Hematological: Negative for adenopathy. Does not bruise/bleed easily.  Psychiatric/Behavioral: Negative for depression and sleep disturbance. The patient is not nervous/anxious.     VITALS:  There were no vitals taken for this visit.  Wt Readings from Last 3 Encounters:  06/20/20 230 lb 8 oz (104.6 kg)  06/18/20 231 lb 14.4 oz (105.2 kg)  05/30/20 224 lb 12 oz (101.9 kg)    There is no height or weight on file to calculate BMI.  Performance status (ECOG): 1 - Symptomatic but completely ambulatory  PHYSICAL EXAM:  Physical Exam Vitals and nursing note reviewed.  Constitutional:      General: She is not in acute distress.    Appearance: Normal appearance.  HENT:     Head: Normocephalic and atraumatic.     Mouth/Throat:     Mouth: Mucous membranes are moist.     Pharynx: Oropharynx is clear. No oropharyngeal exudate or posterior oropharyngeal erythema.  Eyes:     General: No scleral icterus.    Extraocular Movements: Extraocular movements intact.     Conjunctiva/sclera: Conjunctivae normal.     Pupils: Pupils are equal, round, and reactive to  light.  Cardiovascular:     Rate and Rhythm: Normal rate and regular rhythm.     Heart sounds: Normal heart sounds. No murmur heard. No friction rub. No gallop.   Pulmonary:     Effort: Pulmonary effort is normal.     Breath sounds: Normal breath sounds. No wheezing, rhonchi or rales.  Chest:  Breasts:     Right: Normal. No swelling, bleeding, inverted nipple, mass, nipple discharge, skin change, axillary adenopathy or supraclavicular adenopathy.     Left: Skin change (edema of the skin with hyperpigmentation due to radiation) present. No bleeding, inverted nipple, mass, nipple discharge, tenderness, axillary adenopathy or supraclavicular adenopathy.    Abdominal:     General: There is no distension.     Palpations: Abdomen is soft. There is no hepatomegaly, splenomegaly or mass.     Tenderness: There is no abdominal tenderness.  Musculoskeletal:        General: Normal range of motion.     Cervical back: Normal range of motion and neck supple. No tenderness.     Right lower leg: No edema.     Left lower leg: No edema.  Lymphadenopathy:     Cervical: No cervical adenopathy.     Upper Body:     Right upper body: No supraclavicular or axillary adenopathy.     Left upper body: No  supraclavicular or axillary adenopathy.     Lower Body: No right inguinal adenopathy. No left inguinal adenopathy.  Skin:    General: Skin is warm and dry.     Coloration: Skin is not jaundiced.     Findings: No rash.  Neurological:     Mental Status: She is alert and oriented to person, place, and time.     Cranial Nerves: No cranial nerve deficit.  Psychiatric:        Mood and Affect: Mood normal.        Behavior: Behavior normal.        Thought Content: Thought content normal.    LABS:   CBC Latest Ref Rng & Units 07/09/2020 06/18/2020 05/28/2020  WBC - 6.7 7.4 5.6  Hemoglobin 12.0 - 16.0 12.4 12.5 12.2  Hematocrit 36 - 46 38 38 38  Platelets 150 - 399 236 250 245   CMP Latest Ref Rng & Units  07/09/2020 06/18/2020 05/28/2020  Glucose 70 - 99 mg/dL - - -  BUN 4 - _0 Creatinine 0.5 - 1.1 1.0 1.1 1.0  Sodium 137 - 147 140 138 138  Potassium 3.4 - 5.3 3.6 3.7 3.7  Chloride 99 - 108 107 105 106  CO2 13 - 22 24(A) 23(A) 24(A)  Calcium 8.7 - 10.7 9.7 9.5 9.7  Total Protein 6.5 - 8.1 g/dL - - -  Total Bilirubin 0.3 - 1.2 mg/dL - - -  Alkaline Phos 25 - 125 76 82 78  AST 13 - 35 _1 ALT 7 - 35 _2 No results found for: CEA1 / No results found for: CEA1 No results found for: PSA1 No results found for: KYH062 No results found for: BJS283  No results found for: TOTALPROTELP, ALBUMINELP, A1GS, A2GS, BETS, BETA2SER, GAMS, MSPIKE, SPEI No results found for: TIBC, FERRITIN, IRONPCTSAT No results found for: LDH  STUDIES:  No results found.   HISTORY:   Past Medical History:  Diagnosis Date  . CAD (coronary artery disease) 09/06/2019  . Cardiomyopathy ([place]) 06/26/2019  . Chest pain on exertion 10/18/2016  . Hypertension 10/18/2016  . Malignant neoplasm of left breast ([place])   . Malignant neoplasm of left female breast ([place]) 11/23/2019  . Morbid (severe) obesity due to excess calories ([place]) 06/25/2019   Formatting of this note might be different from the original. per last BMI of 41.64 on 02/13/2018  . Tobacco use disorder 10/19/2016    Past Surgical History:  Procedure Laterality Date  . ABLATION    . TUBAL LIGATION      Family History  Problem Relation Age of Onset  . Throat cancer Mother   . Hypertension Father   . Stroke Father   . Heart disease Father     Social History:  reports that she quit smoking about 13 months ago. Her smoking use included cigarettes. She has never used smokeless tobacco. She reports previous alcohol use. She reports that she does not use drugs.The patient is alone today.  Allergies:  Allergies  Allergen Reactions  . Sulfa Antibiotics Hives    Current Medications: Current Outpatient Medications  Medication Sig  Dispense Refill  . aspirin EC 81 MG tablet Take 81 mg by mouth daily. Swallow whole.    Marland Kitchen atorvastatin (LIPITOR) 20 MG tablet Take 1 tablet (20 mg total) by mouth daily. 90 tablet 3  . cetirizine (ZYRTEC) 10 MG tablet Take 10 mg by mouth daily.    Marland Kitchen  DULoxetine (CYMBALTA) 30 MG capsule Take 30 mg by mouth 2 (two) times daily.    . hydrOXYzine (ATARAX/VISTARIL) 25 MG tablet Take 25 mg by mouth 4 (four) times daily as needed.    . hypromellose (GENTEAL) 0.3 % GEL ophthalmic ointment Administer 1 drop to both eyes at bedtime.    Marland Kitchen loratadine (CLARITIN) 10 MG tablet Take 10 mg by mouth daily.    . methimazole (TAPAZOLE) 10 MG tablet Take by mouth.    . nitroGLYCERIN (NITROSTAT) 0.4 MG SL tablet PLACE 1 TABLET UNDER THE TONGUE EVERY 5 (FIVE) MINUTES AS NEEDED. 75 tablet 2  . ondansetron (ZOFRAN) 4 MG tablet Take 4 mg by mouth every 4 (four) hours as needed.    . Oxycodone HCl 10 MG TABS Take 10 mg by mouth 4 (four) times daily as needed.    . prochlorperazine (COMPAZINE) 10 MG tablet Take 10 mg by mouth every 6 (six) hours as needed.    . sacubitril-valsartan (ENTRESTO) 24-26 MG Take 1 tablet by mouth 2 (two) times daily. 60 tablet 3  . traMADol (ULTRAM) 50 MG tablet Take 1 tablet (50 mg total) by mouth every 6 (six) hours as needed (pain). 60 tablet 1  . traZODone (DESYREL) 50 MG tablet Take 50 mg by mouth at bedtime as needed.    . TYLENOL 500 MG tablet Take 1,000 mg by mouth every 8 (eight) hours as needed.    . venlafaxine XR (EFFEXOR-XR) 37.5 MG 24 hr capsule Take 37.5 mg by mouth 2 (two) times daily.     No current facility-administered medications for this visit.     ASSESSMENT & PLAN:   Assessment:  1. 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 No primary breast lesion was found. She was treated with neoadjuvant docetaxel/carboplatin/trastuzumab, left axillary lymph node dissection and adjuvant radiation therapy to the left  breast and axilla.She continues maintenance trastuzumab. Bilateral diagnostic mammogram is scheduled for June 8th.  2. Cardiomyopathy with decreased left ventricular ejection fraction. This is being managed by [name]. [name] from September revealed an ejection fraction of 39%, which was stable. Echocardiogramin November revealed an ejection fraction between 60-65%, which was a puzzle, as it has never been normal. Most recent echocardiogram reveals and ejection fraction of 45-50%, which is closer to her baseline. She remains without symptoms of heart failure. The patient understands that continued trastuzumab can cause worsening heart function, but this is considered reversible.  Recent [name] reveals slightly improved left ventricular function.She has 2 treatments remaining.  3. Bilateral hip and back pain, which is stable. MRI lumbar spine and left hip did not reveal any evidence of malignancy.  There were degenerative changes in the lumbar spine at L5-S1.  4. 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  5.Thyroid eye disease, she is planned for a procedure, but needs to see the cardiologist for surgical clearance.  Plan:   We will proceed with a 16th cycle of trastuzumab this week.  I will plan to see her back in 3 weeks with a CBC and comprehensive metabolic panel prior to a 17th and final cycle of maintenance trastuzumab.  The patient understands the plans discussed today and is in agreement with them.  She knows to contact our office if she develops concerns prior to her next appointment.     Marvia Pickles, PA-C

## 2020-07-10 ENCOUNTER — Encounter: Payer: Self-pay | Admitting: Hematology and Oncology

## 2020-07-10 ENCOUNTER — Encounter: Payer: Self-pay | Admitting: Oncology

## 2020-07-11 ENCOUNTER — Inpatient Hospital Stay: Payer: Medicaid Other

## 2020-07-11 ENCOUNTER — Other Ambulatory Visit: Payer: Self-pay

## 2020-07-11 VITALS — BP 124/66 | HR 68 | Resp 18 | Ht 63.0 in | Wt 231.0 lb

## 2020-07-11 DIAGNOSIS — C773 Secondary and unspecified malignant neoplasm of axilla and upper limb lymph nodes: Secondary | ICD-10-CM | POA: Diagnosis present

## 2020-07-11 DIAGNOSIS — Z87891 Personal history of nicotine dependence: Secondary | ICD-10-CM | POA: Diagnosis not present

## 2020-07-11 DIAGNOSIS — Z5112 Encounter for antineoplastic immunotherapy: Secondary | ICD-10-CM | POA: Diagnosis not present

## 2020-07-11 DIAGNOSIS — C50412 Malignant neoplasm of upper-outer quadrant of left female breast: Secondary | ICD-10-CM | POA: Diagnosis present

## 2020-07-11 DIAGNOSIS — Z79899 Other long term (current) drug therapy: Secondary | ICD-10-CM | POA: Diagnosis not present

## 2020-07-11 DIAGNOSIS — Z7982 Long term (current) use of aspirin: Secondary | ICD-10-CM | POA: Diagnosis not present

## 2020-07-11 DIAGNOSIS — I10 Essential (primary) hypertension: Secondary | ICD-10-CM | POA: Diagnosis not present

## 2020-07-11 DIAGNOSIS — Z170 Estrogen receptor positive status [ER+]: Secondary | ICD-10-CM | POA: Diagnosis not present

## 2020-07-11 DIAGNOSIS — L598 Other specified disorders of the skin and subcutaneous tissue related to radiation: Secondary | ICD-10-CM | POA: Diagnosis not present

## 2020-07-11 DIAGNOSIS — C50912 Malignant neoplasm of unspecified site of left female breast: Secondary | ICD-10-CM

## 2020-07-11 MED ORDER — HEPARIN SOD (PORK) LOCK FLUSH 100 UNIT/ML IV SOLN
500.0000 [IU] | Freq: Once | INTRAVENOUS | Status: AC | PRN
  Administered 2020-07-11: 500 [IU]
  Filled 2020-07-11: qty 5

## 2020-07-11 MED ORDER — SODIUM CHLORIDE 0.9 % IV SOLN
Freq: Once | INTRAVENOUS | Status: AC
Start: 2020-07-11 — End: 2020-07-11
  Filled 2020-07-11: qty 250

## 2020-07-11 MED ORDER — DIPHENHYDRAMINE HCL 50 MG/ML IJ SOLN: 25.0000 mg | Freq: Once | INTRAMUSCULAR | Status: DC

## 2020-07-11 MED ORDER — ACETAMINOPHEN 325 MG PO TABS: 650.0000 mg | ORAL_TABLET | Freq: Once | ORAL | Status: DC

## 2020-07-11 MED ORDER — TRASTUZUMAB-ANNS CHEMO 150 MG IV SOLR
6.0000 mg/kg | Freq: Once | INTRAVENOUS | Status: AC
  Administered 2020-07-11: 609 mg via INTRAVENOUS
  Filled 2020-07-11: qty 29

## 2020-07-11 NOTE — Patient Instructions (Signed)
[facility]  Discharge Instructions: Thank you for choosing [facility] to provide your oncology and hematology care.  If you have a lab appointment with the Cancer Center, please go directly to the Cancer Center and check in at the registration area.   Wear comfortable clothing and clothing appropriate for easy access to any Portacath or PICC line.   We strive to give you quality time with your provider. You may need to reschedule your appointment if you arrive late (15 or more minutes).  Arriving late affects you and other patients whose appointments are after yours.  Also, if you miss three or more appointments without notifying the office, you may be dismissed from the clinic at the provider's discretion.      For prescription refill requests, have your pharmacy contact our office and allow 72 hours for refills to be completed.    Today you received the following chemotherapy and/or immunotherapy agents Trastuzumab     To help prevent nausea and vomiting after your treatment, we encourage you to take your nausea medication as directed.  BELOW ARE SYMPTOMS THAT SHOULD BE REPORTED IMMEDIATELY: *FEVER GREATER THAN 100.4 F (38 C) OR HIGHER *CHILLS OR SWEATING *NAUSEA AND VOMITING THAT IS NOT CONTROLLED WITH YOUR NAUSEA MEDICATION *UNUSUAL SHORTNESS OF BREATH *UNUSUAL BRUISING OR BLEEDING *URINARY PROBLEMS (pain or burning when urinating, or frequent urination) *BOWEL PROBLEMS (unusual diarrhea, constipation, pain near the anus) TENDERNESS IN MOUTH AND THROAT WITH OR WITHOUT PRESENCE OF ULCERS (sore throat, sores in mouth, or a toothache) UNUSUAL RASH, SWELLING OR PAIN  UNUSUAL VAGINAL DISCHARGE OR ITCHING   Items with * indicate a potential emergency and should be followed up as soon as possible or go to the Emergency Department if any problems should occur.  Please show the CHEMOTHERAPY ALERT CARD or IMMUNOTHERAPY ALERT CARD at check-in to the  Emergency Department and triage nurse.  Should you have questions after your visit or need to cancel or reschedule your appointment, please contact [facility] [place]  Dept: [phone]  and follow the prompts.  Office hours are 8:00 a.m. to 4:30 p.m. Monday - Friday. Please note that voicemails left after 4:00 p.m. may not be returned until the following business day.  We are closed weekends and major holidays. You have access to a nurse at all times for urgent questions. Please call the main number to the clinic Dept: [phone] and follow the prompts.  For any non-urgent questions, you may also contact your provider using MyChart. We now offer e-Visits for anyone 18 and older to request care online for non-urgent symptoms. For details visit [email or web address].   Also download the MyChart app! Go to the app store, search "MyChart", open the app, select [facility], and log in with your MyChart username and password.  Due to Covid, a mask is required upon entering the hospital/clinic. If you do not have a mask, one will be given to you upon arrival. For doctor visits, patients may have 1 support person aged 18 or older with them. For treatment visits, patients cannot have anyone with them due to current Covid guidelines and our immunocompromised population.    

## 2020-07-17 ENCOUNTER — Encounter: Payer: Self-pay | Admitting: Oncology

## 2020-07-25 ENCOUNTER — Encounter: Payer: Self-pay | Admitting: Hematology and Oncology

## 2020-07-30 ENCOUNTER — Inpatient Hospital Stay: Payer: Medicaid Other

## 2020-07-30 ENCOUNTER — Encounter: Payer: Self-pay | Admitting: Hematology and Oncology

## 2020-07-30 ENCOUNTER — Other Ambulatory Visit: Payer: Self-pay

## 2020-07-30 ENCOUNTER — Inpatient Hospital Stay (INDEPENDENT_AMBULATORY_CARE_PROVIDER_SITE_OTHER): Payer: Medicaid Other | Admitting: Hematology and Oncology

## 2020-07-30 VITALS — BP 156/83 | HR 78 | Temp 98.5°F | Resp 18 | Ht 63.0 in | Wt 228.6 lb

## 2020-07-30 DIAGNOSIS — C50012 Malignant neoplasm of nipple and areola, left female breast: Secondary | ICD-10-CM

## 2020-07-30 DIAGNOSIS — R251 Tremor, unspecified: Secondary | ICD-10-CM

## 2020-07-30 DIAGNOSIS — Z171 Estrogen receptor negative status [ER-]: Secondary | ICD-10-CM

## 2020-07-30 DIAGNOSIS — C50912 Malignant neoplasm of unspecified site of left female breast: Secondary | ICD-10-CM

## 2020-07-30 LAB — CBC: RBC: 4.39 (ref 3.87–5.11)

## 2020-07-30 LAB — HEPATIC FUNCTION PANEL
ALT: 21 (ref 7–35)
AST: 27 (ref 13–35)
Alkaline Phosphatase: 73 (ref 25–125)

## 2020-07-30 LAB — BASIC METABOLIC PANEL
BUN: 17 (ref 4–21)
CO2: 24 — AB (ref 13–22)
Chloride: 106 (ref 99–108)
Creatinine: 0.9 (ref 0.5–1.1)
Glucose: 133
Potassium: 3.9 (ref 3.4–5.3)
Sodium: 141 (ref 137–147)

## 2020-07-30 LAB — CBC AND DIFFERENTIAL
HCT: 41 (ref 36–46)
Hemoglobin: 13.5 (ref 12.0–16.0)
Neutrophils Absolute: 4.03
Platelets: 249 (ref 150–399)
WBC: 6.2

## 2020-07-30 LAB — COMPREHENSIVE METABOLIC PANEL
Albumin: 4.6 (ref 3.5–5.0)
Calcium: 9.6 (ref 8.7–10.7)

## 2020-07-30 NOTE — Progress Notes (Signed)
[facility]  [address] [place],  [place]  [address] [phone]  Clinic Day:  07/30/2020  Referring physician: Marlene Lard, PA-C   CHIEF COMPLAINT:  CC:   Stage IIA HER2 receptor positive breast cancer  Current Treatment:  Maintenance trastuzumab  HISTORY OF PRESENT ILLNESS:  Brandi Weaver is a 55 y.o. female with stage IIA (T0 N1 M0) HER2 receptor positive left breast cancer diagnosed in April 2021.  The patient felt a mass in the left axilla in March and reported this to her primary care provider.  Her last bilateral screening mammogram had been in November 2019 with a diagnostic left mammogram and ultrasound in December which revealed probable inframammary lymph node over the upper outer quadrant.  Six-month follow-up was recommended. Ultrasound in March 2021 revealed multiple hypoechoic lesions of the left axilla with the largest up to 4.6 cm in diameter with another measuring 3.3 cm and another 2.6 cm.  The changes were felt possibly represent lymphadenitis.  Bilateral diagnostic mammogram at [facility] March revealed at least 7 enlarged intramammary and axillary lymph nodes.  No primary breast lesion was found.  Targeted left breast ultrasound revealed an increase in the previously demonstrated 7 mm intramammary node at 1:30 o'clock measured 13 mm with diffuse cortical thickening.  There were multiple enlarged left axillary nodes with eccentric cortical thickening with a maximum thickness of 19.8 mm.  Ultrasound-guided biopsy of the left axillary lymph node in April revealed metastatic carcinoma consistent with breast primary.  GATA 3 was positive with negative thyroglobulin, negative Napsin A and TTF1, and the PAX8 was equivocal.  Estrogen and progesterone receptors were negative and HER 2 positive.  Ki 67 was 40%.  Staging CT chest, abdomen and pelvis revealed bulky left axillary lymphadenopathy extending to the sub pectoralis nodal  station with a 6 mm right upper paratracheal lymph node.  There was no evidence of metastatic disease in the lungs, abdomen or pelvis.  Breast MRI revealed a 1 x 1.2 oval enhancing mass within the posterior upper outer left breast felt to be an intraparenchymal lymph node, no breast masses were seen.  There was re-demonstration of the left axillary adenopathy.  On physical examination the left axillary adenopathy measured approximately 9 x 7 cm.  She had uterine ablation in 2011 for uterine fibroids, then experienced 1 day of uterine bleeding in 2012, but denies further bleeding.    We recommended neoadjuvant chemotherapy to include TCHP x 6 cycles with trastuzumab and pertuzumab for one full year.  Unfortunately, echocardiogram  In May revealed ejection fraction between 40 and 45%.  This was confirmed with [name] on May 10th, which revealed an ejection fraction of 42%.  Based on these findings, we changed the treatment regimen to docetaxel/carboplatin/trastuzumab [facility]) and eliminated pertuzumab, due to the potential cardiotoxicities from HER2 targeted therapies.  We also recommended repeat cardiac imaging every 6 weeks and instead of the standard every 12 weeks.  She was referred to [name] and he started her on Entresto and has been monitoring her cardiac function.  She reported some chest tightness to him, so he recommended a [name].  She had quit smoking.  She received her 1st cycle of docetaxel, carboplatin and trastuzumab in May.  She experienced toxicities of decreased appetite, minimal nausea and vomiting, and generalized arthralgias.  Her biggest complaint was generalized achiness and hip pain, felt to be most likely due to [name].  Her pain was not controlled with hydrocodone/APAP  5/325 or 10/325, so she was given oxycodone 5 mg 1-2 tablets every 6 hours as needed for pain.  She was placed on esomeprazole 40 mg daily for acid reflux. She quit smoking in April 2021.  She continued  Entresto daily.  She underwent nuclear stress test at [name] office in June, which revealed a fairly stable ejection fraction of 39%.  She was also recommended for CT coronary angiography, which is to be scheduled at [facility].  She underwent a coronary/cardiac CT in July revealed moderate CAD in proximal LAD and mild CAD in mid left main coronary artery, CADRADS = 3, coronary calcium score is 168, which places the patient in the 70 percentile for age and dex matched control, normal coronary origin with right dominance and mildly dilated main pulmonary artery, 30 mm. [name] from August revealed a left ventricular ejection fraction of 39%, previously 42% in May, which was discussed with [name].  We discussed the risks and benefits with the patient again.  Overall, we feel that the benefit outweighs any further risk, as long as she is closely monitored.  Bilateral breast MRI in September revealed a decrease in the left axillary lymphadenopathy.  However, there was a persistent, but smaller circumscribed oval mass in the upper outer quadrant of the left breast.  We discussed this with [name] of breast radiology and we suspect this could represent the breast primary, but biopsy was negative.  [name] imaging in September revealed a left ventricular fraction is 39%, which was stable.    Ultrasound guided left breast core needle biopsy in September revealed lymphoid tissue with reactive lymphoid hyperplasia and was negative for granulomas or malignancy.  No metastatic breast carcinoma could be seen.  She then underwent left axillary lymph node dissection on October 14th with [name]. Surgical pathology from this procedure found tumor present in 3 of 27 lymph nodes (3/27).  One lymph node was positive for macrometastasis measuring 46m.  Two lymph nodes had micrometastasis.  There were no lymph nodes with isolated tumor cells and no extranodal tumor seen. Echocardiogram in November revealed a normal  ejection fraction between 60-65% , which was a puzzle, as very different from her baseline. She was referred to physical therapy for range of motion after her surgery. She started maintenance trastuzumab in November.  She continued to report persistent severe left hip pain, with mild pain in the right hip.  Previous left hip x-ray did not reveal any abnormality.  CT imaging in May also had not revealed any abnormality in the hips or lumbar spine.  She had not received chemotherapy or [name] since September, so we did not feel her pain can be attributed to this. MRI lumbar spine and left hip revealed mild-to-moderate facet arthropathy at L5-S1, which was worse on the right.  MRI left hip was negative. Her primary care provider referred her for physical therapy.  She has continued trastuzumab every 3 weeks.  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 Echocardiogram in March revealed an ejection fraction of 45-50%, which although decreased from November, was closer to her baseline. We decided to continue with trastuzumab and plan to repeat echocardiogram in 6 weeks.  Our pharmacist recommended a [name] in 6 weeks instead of echocardiogram. [name] scan on April 25th revealed a slight increase in the ejection fraction at 41%, so we have continued trastuzumab every 3 weeks.   INTERVAL HISTORY:  Brandi Weaver is here today for repeat clinical assessment prior to a 17th and final cycle of trastuzumab. She reports new onset right hand tremor  With paresthesias of the fingertips in the last 2 to 3 weeks, which seems to be getting worse.  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  She is on venlafaxine 75 mg daily, as well as trazodone and oxycodone. She reports persistent edema and hyperpigmentation of the left breast, which is not tender. She  otherwise denies changes in her breasts. She denies dyspnea, chest pain or lower extremity edema. She denies fevers or chills. She continues to report lower back pain, which is stable. Physical therapy was placed on hold due to her radiation dermatitis and she has not resumed that. Her appetite is good. Her weight has been stable. Prior to her visit today she had a bilateral diagnostic mammogram.  She is scheduled for a procedure for her thyroid eye disease in August.  She sees her endocrinologist again in July and states she has an MRI of her head scheduled for August.  REVIEW OF SYSTEMS:  Review of Systems  Constitutional:  Negative for appetite change, chills, fatigue, fever and unexpected weight change.  HENT:   Negative for lump/mass, mouth sores and sore throat.   Respiratory:  Negative for cough and shortness of breath.   Cardiovascular:  Negative for chest pain and leg swelling.  Gastrointestinal:  Negative for abdominal pain, constipation, diarrhea, nausea and vomiting.  Endocrine: Negative for hot flashes.  Genitourinary:  Negative for difficulty urinating, dysuria, frequency and hematuria.   Musculoskeletal:  Positive for back pain (Chronic, stable). Negative for arthralgias and myalgias.  Skin:  Negative for rash.  Neurological:  Positive for numbness (right fingertips with new right hand tremor). Negative for dizziness and headaches.  Hematological:  Negative for adenopathy. Does not bruise/bleed easily.  Psychiatric/Behavioral:  Negative for depression and sleep disturbance. The patient is not nervous/anxious.    VITALS:  Blood pressure (!) 156/83, pulse 78, temperature 98.5 F (36.9 C), temperature source Oral, resp. rate 18, height 5' 3" (1.6 m), weight 228 lb 9.6 oz (103.7 kg), SpO2 96 %.  Wt Readings from Last 3 Encounters:  07/30/20 228 lb 9.6 oz (103.7 kg)  07/11/20 231 lb (104.8 kg)  07/09/20 229 lb 4.8 oz (104 kg)    Body mass index is 40.49 kg/m.  Performance status  (ECOG): 1 - Symptomatic but completely ambulatory  PHYSICAL EXAM:  Physical Exam Vitals and nursing note reviewed.  Constitutional:      General: She is not in acute distress.    Appearance: Normal appearance.  HENT:     Head: Normocephalic and atraumatic.     Mouth/Throat:     Mouth: Mucous membranes are moist.     Pharynx: Oropharynx is clear. No oropharyngeal exudate or posterior oropharyngeal erythema.  Eyes:     General: No scleral icterus.    Extraocular Movements: Extraocular movements intact.     Conjunctiva/sclera: Conjunctivae normal.     Pupils: Pupils are equal, round, and reactive to light.  Cardiovascular:     Rate and Rhythm: Normal rate and regular rhythm.     Heart sounds: Normal heart sounds. No murmur heard.   No friction rub. No gallop.  Pulmonary:     Effort: Pulmonary effort is normal.     Breath sounds: Normal breath sounds. No wheezing, rhonchi or rales.  Chest:  Breasts:    Right: Normal. No swelling, bleeding, inverted nipple, mass, nipple discharge, skin change, axillary adenopathy or supraclavicular adenopathy.     Left: Skin change (edema of the skin with hyperpigmentation due to radiation) present. No bleeding, inverted nipple, mass, nipple discharge, tenderness, axillary adenopathy or supraclavicular adenopathy.  Abdominal:     General: There is no distension.     Palpations: Abdomen is soft. There is no hepatomegaly, splenomegaly or mass.     Tenderness: There is no abdominal tenderness.  Musculoskeletal:        General: Normal range of motion.     Cervical back: Normal range of motion and neck supple. No tenderness.     Right lower leg: No edema.     Left lower leg: No edema.  Lymphadenopathy:     Cervical: No cervical adenopathy.     Upper Body:     Right upper body: No supraclavicular or axillary adenopathy.     Left upper body: No supraclavicular or axillary adenopathy.     Lower Body: No right inguinal adenopathy. No left inguinal  adenopathy.  Skin:    General: Skin is warm and dry.     Coloration: Skin is not jaundiced.     Findings: No rash.  Neurological:     Mental Status: She is alert and oriented to person, place, and time.     Cranial Nerves: Cranial nerves are intact.     Sensory: Sensation is intact.     Motor: Tremor (mild right hand intention tremor) present.     Coordination: Coordination normal. Finger-Nose-Finger Test abnormal (slow on right with intention tremor). Rapid alternating movements normal.     Gait: Gait is intact.  Psychiatric:        Mood and Affect: Mood normal.        Behavior: Behavior normal.        Thought Content: Thought content normal.   LABS:   CBC Latest Ref Rng & Units 07/30/2020 07/09/2020 06/18/2020  WBC - 6.2 6.7 7.4  Hemoglobin 12.0 - 16.0 13.5 12.4 12.5  Hematocrit 36 - 46 41 38 38  Platelets 150 - 399 249 236 250   CMP Latest Ref Rng & Units 07/30/2020 07/09/2020 06/18/2020  Glucose 70 - 99 mg/dL - - -  BUN 4 - _0 Creatinine 0.5 - 1.1 0.9 1.0 1.1  Sodium 137 - 147 141 140 138  Potassium 3.4 - 5.3 3.9 3.6 3.7  Chloride 99 - 108 106 107 105  CO2 13 - 22 24(A) 24(A) 23(A)  Calcium 8.7 - 10.7 9.6 9.7 9.5  Total Protein 6.5 - 8.1 g/dL - - -  Total Bilirubin 0.3 - 1.2 mg/dL - - -  Alkaline Phos 25 - 125 73 76 82  AST 13 - 35 _1 ALT 7 - 35 _2 No results found for: CEA1 / No results found for: CEA1 No results found for: PSA1 No results found for: UVO536 No results found for: UYQ034  No results found for: TOTALPROTELP, ALBUMINELP, A1GS, A2GS, BETS, BETA2SER, GAMS, MSPIKE, SPEI No results found for: TIBC, FERRITIN, IRONPCTSAT No results found for: LDH  STUDIES:  No results found.  Exam(s): [id number] MAM/MAM DIGITAL W/TOMO DIAG B CLINICAL DATA:  Patient had an axillary dissection and is receiving chemotherapy and radiation therapy for metastatic carcinoma in left axillary lymph nodes.  EXAM: DIGITAL DIAGNOSTIC BILATERAL MAMMOGRAM WITH  TOMOSYNTHESIS AND CAD  TECHNIQUE: Bilateral  digital diagnostic mammography and breast tomosynthesis was performed. The images were evaluated with computer-aided detection.  COMPARISON:  Previous exam(s).  ACR Breast Density Category c: The breast tissue is heterogeneously dense, which may obscure small masses.  FINDINGS: Surgical changes are seen in the left axilla. There are changes of radiation therapy in the left breast. No suspicious mass or malignant type microcalcifications identified in either breast.  IMPRESSION: No evidence of malignancy in either breast.  RECOMMENDATION: Bilateral diagnostic mammogram in 1 year is recommended.  I have discussed the findings and recommendations with the patient. If applicable, a reminder letter will be sent to the patient regarding the next appointment.  HISTORY:   Past Medical History:  Diagnosis Date   CAD (coronary artery disease) 09/06/2019   Cardiomyopathy ([place]) 06/26/2019   Chest pain on exertion 10/18/2016   Hypertension 10/18/2016   Malignant neoplasm of left breast ([place])    Malignant neoplasm of left female breast ([place]) 11/23/2019   Morbid (severe) obesity due to excess calories ([place]) 06/25/2019   Formatting of this note might be different from the original. per last BMI of 41.64 on 02/13/2018   Tobacco use disorder 10/19/2016    Past Surgical History:  Procedure Laterality Date   ABLATION     TUBAL LIGATION      Family History  Problem Relation Age of Onset   Throat cancer Mother    Hypertension Father    Stroke Father    Heart disease Father     Social History:  reports that she quit smoking about 13 months ago. Her smoking use included cigarettes. She has never used smokeless tobacco. She reports previous alcohol use. She reports that she does not use drugs.The patient is alone today.  Allergies:  Allergies  Allergen Reactions   Sulfa Antibiotics Hives    Current Medications: Current Outpatient  Medications  Medication Sig Dispense Refill   aspirin EC 81 MG tablet Take 81 mg by mouth daily. Swallow whole.     atorvastatin (LIPITOR) 20 MG tablet Take 1 tablet (20 mg total) by mouth daily. 90 tablet 3   cetirizine (ZYRTEC) 10 MG tablet Take 10 mg by mouth daily.     gabapentin (NEURONTIN) 600 MG tablet Take 600 mg by mouth daily.     hydrOXYzine (ATARAX/VISTARIL) 25 MG tablet Take 25 mg by mouth 4 (four) times daily as needed.     loratadine (CLARITIN) 10 MG tablet Take 10 mg by mouth daily.     methimazole (TAPAZOLE) 10 MG tablet Take by mouth.     nitroGLYCERIN (NITROSTAT) 0.4 MG SL tablet PLACE 1 TABLET UNDER THE TONGUE EVERY 5 (FIVE) MINUTES AS NEEDED. 75 tablet 2   ondansetron (ZOFRAN) 4 MG tablet Take 4 mg by mouth every 4 (four) hours as needed.     Oxycodone HCl 10 MG TABS Take 10 mg by mouth 4 (four) times daily as needed.     prochlorperazine (COMPAZINE) 10 MG tablet Take 10 mg by mouth every 6 (six) hours as needed.     sacubitril-valsartan (ENTRESTO) 24-26 MG Take 1 tablet by mouth 2 (two) times daily. (Patient taking differently: Take 1 tablet by mouth daily.) 60 tablet 3   traZODone (DESYREL) 50 MG tablet Take 50 mg by mouth at bedtime as needed.     TYLENOL 500 MG tablet Take 1,000 mg by mouth every 8 (eight) hours as needed.     venlafaxine XR (EFFEXOR-XR) 75 MG 24 hr capsule Take 75 mg by mouth at  bedtime.     No current facility-administered medications for this visit.     ASSESSMENT & PLAN:   Assessment:  1. 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  No primary breast lesion was found.  She was treated with neoadjuvant docetaxel/carboplatin/trastuzumab, left axillary lymph node dissection and adjuvant radiation therapy to the left breast and axilla. She continues maintenance trastuzumab and is here today for her final cycle. Bilateral diagnostic mammogram this month did not reveal any evidence of  malignancy.   2. Cardiomyopathy with decreased left ventricular ejection fraction.  This is being managed by [name].  [name] from September revealed an ejection fraction of 39%, which was stable. Echocardiogram in November revealed an ejection fraction between 60-65%, which was a puzzle, as it has never been normal. Most recent echocardiogram reveals and ejection fraction of 45-50%, which is closer to her baseline. She remains without symptoms of heart failure. The patient understands that continued trastuzumab can cause worsening heart function, but this is considered reversible.  Recent [name] reveals slightly improved left ventricular function.   3. Bilateral hip and back pain, which is stable.  MRI lumbar spine and left hip did not reveal any evidence of malignancy.  There were degenerative changes in the lumbar spine at L5-S1.   4. 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  5. Thyroid eye disease, she is planned for a procedure, but needs to see the cardiologist for surgical clearance.  6. New right hand tremor of uncertain etiology.  I suspect medication effects, but due to her locally advanced HER2 positive breast cancer we will schedule an MRI brain to evaluate for metastatic disease.  We will hold her trastuzumab until we have the results.  Plan:   We will hold her final cycle of trastuzumab pending the MRI results, if no intracranial metastasis are found, she will proceed with trastuzumab in the next several days.  I will plan to see her back in 3 months with a CBC and comprehensive metabolic panel for repeat clinical assessment.  The patient understands the plans discussed today and is in agreement with them.  She knows to contact our office if she develops concerns prior to her next appointment.     Marvia Pickles, PA-C

## 2020-07-31 ENCOUNTER — Other Ambulatory Visit: Payer: Self-pay | Admitting: Pharmacist

## 2020-07-31 ENCOUNTER — Encounter: Payer: Self-pay | Admitting: Oncology

## 2020-08-01 ENCOUNTER — Encounter: Payer: Self-pay | Admitting: Oncology

## 2020-08-01 ENCOUNTER — Other Ambulatory Visit: Payer: Self-pay

## 2020-08-01 ENCOUNTER — Inpatient Hospital Stay: Payer: Medicaid Other

## 2020-08-01 VITALS — BP 139/74 | HR 74 | Temp 98.2°F | Resp 18 | Ht 63.0 in | Wt 230.0 lb

## 2020-08-01 DIAGNOSIS — C50912 Malignant neoplasm of unspecified site of left female breast: Secondary | ICD-10-CM

## 2020-08-01 DIAGNOSIS — Z5112 Encounter for antineoplastic immunotherapy: Secondary | ICD-10-CM | POA: Diagnosis not present

## 2020-08-01 MED ORDER — TRASTUZUMAB-ANNS CHEMO 150 MG IV SOLR
600.0000 mg | Freq: Once | INTRAVENOUS | Status: AC
  Administered 2020-08-01: 600 mg via INTRAVENOUS
  Filled 2020-08-01: qty 28.57

## 2020-08-01 MED ORDER — SODIUM CHLORIDE 0.9 % IV SOLN
Freq: Once | INTRAVENOUS | Status: DC
  Filled 2020-08-01: qty 250

## 2020-08-01 MED ORDER — ACETAMINOPHEN 325 MG PO TABS: 650.0000 mg | ORAL_TABLET | Freq: Once | ORAL | Status: DC

## 2020-08-01 MED ORDER — SODIUM CHLORIDE 0.9% FLUSH
10.0000 mL | INTRAVENOUS | Status: DC | PRN
Start: 2020-08-01 — End: 2020-08-01
  Administered 2020-08-01: 10 mL
  Filled 2020-08-01: qty 10

## 2020-08-01 MED ORDER — DIPHENHYDRAMINE HCL 50 MG/ML IJ SOLN: 25.0000 mg | Freq: Once | INTRAMUSCULAR | Status: DC

## 2020-08-01 MED ORDER — HEPARIN SOD (PORK) LOCK FLUSH 100 UNIT/ML IV SOLN
500.0000 [IU] | Freq: Once | INTRAVENOUS | Status: AC | PRN
  Administered 2020-08-01: 500 [IU]
  Filled 2020-08-01: qty 5

## 2020-08-01 NOTE — Patient Instructions (Signed)
Trastuzumab injection for infusion What is this medication? TRASTUZUMAB (tras TOO zoo mab) is a monoclonal antibody. It is used to treat breast cancer and stomach cancer. This medicine may be used for other purposes; ask your health care provider or pharmacist if you have questions. COMMON BRAND NAME(S): Herceptin, Herzuma, KANJINTI, Ogivri, Ontruzant, Trazimera What should I tell my care team before I take this medication? They need to know if you have any of these conditions: heart disease heart failure lung or breathing disease, like asthma an unusual or allergic reaction to trastuzumab, benzyl alcohol, or other medications, foods, dyes, or preservatives pregnant or trying to get pregnant breast-feeding How should I use this medication? This drug is given as an infusion into a vein. It is administered in a hospital or clinic by a specially trained health care professional. Talk to your pediatrician regarding the use of this medicine in children. This medicine is not approved for use in children. Overdosage: If you think you have taken too much of this medicine contact a poison control center or emergency room at once. NOTE: This medicine is only for you. Do not share this medicine with others. What if I miss a dose? It is important not to miss a dose. Call your doctor or health care professional if you are unable to keep an appointment. What may interact with this medication? This medicine may interact with the following medications: certain types of chemotherapy, such as daunorubicin, doxorubicin, epirubicin, and idarubicin This list may not describe all possible interactions. Give your health care provider a list of all the medicines, herbs, non-prescription drugs, or dietary supplements you use. Also tell them if you smoke, drink alcohol, or use illegal drugs. Some items may interact with your medicine. What should I watch for while using this medication? Visit your doctor for checks  on your progress. Report any side effects. Continue your course of treatment even though you feel ill unless your doctor tells you to stop. Call your doctor or health care professional for advice if you get a fever, chills or sore throat, or other symptoms of a cold or flu. Do not treat yourself. Try to avoid being around people who are sick. You may experience fever, chills and shaking during your first infusion. These effects are usually mild and can be treated with other medicines. Report any side effects during the infusion to your health care professional. Fever and chills usually do not happen with later infusions. Do not become pregnant while taking this medicine or for 7 months after stopping it. Women should inform their doctor if they wish to become pregnant or think they might be pregnant. Women of child-bearing potential will need to have a negative pregnancy test before starting this medicine. There is a potential for serious side effects to an unborn child. Talk to your health care professional or pharmacist for more information. Do not breast-feed an infant while taking this medicine or for 7 months after stopping it. Women must use effective birth control with this medicine. What side effects may I notice from receiving this medication? Side effects that you should report to your doctor or health care professional as soon as possible: allergic reactions like skin rash, itching or hives, swelling of the face, lips, or tongue chest pain or palpitations cough dizziness feeling faint or lightheaded, falls fever general ill feeling or flu-like symptoms signs of worsening heart failure like breathing problems; swelling in your legs and feet unusually weak or tired Side effects that usually   do not require medical attention (report to your doctor or health care professional if they continue or are bothersome): bone pain changes in taste diarrhea joint pain nausea/vomiting weight  loss This list may not describe all possible side effects. Call your doctor for medical advice about side effects. You may report side effects to FDA at 1-800-FDA-1088. Where should I keep my medication? This drug is given in a hospital or clinic and will not be stored at home. NOTE: This sheet is a summary. It may not cover all possible information. If you have questions about this medicine, talk to your doctor, pharmacist, or health care provider.  2022 Elsevier/Gold Standard (2016-01-20 14:37:52)  

## 2020-08-04 ENCOUNTER — Encounter: Payer: Self-pay | Admitting: Hematology and Oncology

## 2020-08-05 ENCOUNTER — Encounter: Payer: Self-pay | Admitting: Cardiology

## 2020-08-05 ENCOUNTER — Other Ambulatory Visit: Payer: Self-pay

## 2020-08-05 ENCOUNTER — Ambulatory Visit (INDEPENDENT_AMBULATORY_CARE_PROVIDER_SITE_OTHER): Payer: Medicaid Other | Admitting: Cardiology

## 2020-08-05 VITALS — BP 130/90 | HR 88 | Ht 67.0 in | Wt 229.8 lb

## 2020-08-05 DIAGNOSIS — R0989 Other specified symptoms and signs involving the circulatory and respiratory systems: Secondary | ICD-10-CM

## 2020-08-05 DIAGNOSIS — I420 Dilated cardiomyopathy: Secondary | ICD-10-CM | POA: Diagnosis not present

## 2020-08-05 DIAGNOSIS — I2510 Atherosclerotic heart disease of native coronary artery without angina pectoris: Secondary | ICD-10-CM

## 2020-08-05 DIAGNOSIS — F17200 Nicotine dependence, unspecified, uncomplicated: Secondary | ICD-10-CM | POA: Diagnosis not present

## 2020-08-05 DIAGNOSIS — I10 Essential (primary) hypertension: Secondary | ICD-10-CM | POA: Diagnosis not present

## 2020-08-05 DIAGNOSIS — R931 Abnormal findings on diagnostic imaging of heart and coronary circulation: Secondary | ICD-10-CM

## 2020-08-05 HISTORY — DX: Other specified symptoms and signs involving the circulatory and respiratory systems: R09.89

## 2020-08-05 HISTORY — DX: Abnormal findings on diagnostic imaging of heart and coronary circulation: R93.1

## 2020-08-05 NOTE — Patient Instructions (Signed)
Medication Instructions:   Your physician recommends that you continue on your current medications as directed. Please refer to the Current Medication list given to you today.  *If you need a refill on your cardiac medications before your next appointment, please call your pharmacy*   Lab Work: None  If you have labs (blood work) drawn today and your tests are completely normal, you will receive your results only by: [place] (if you have MyChart) OR A paper copy in the mail If you have any lab test that is abnormal or we need to change your treatment, we will call you to review the results.   Testing/Procedures: None   Follow-Up: At [facility], you and your health needs are our priority.  As part of our continuing mission to provide you with exceptional heart care, we have created designated Provider Care Teams.  These Care Teams include your primary Cardiologist (physician) and Advanced Practice Providers (APPs -  Physician Assistants and Nurse Practitioners) who all work together to provide you with the care you need, when you need it.  We recommend signing up for the patient portal called "MyChart".  Sign up information is provided on this After Visit Summary.  MyChart is used to connect with patients for Virtual Visits (Telemedicine).  Patients are able to view lab/test results, encounter notes, upcoming appointments, etc.  Non-urgent messages can be sent to your provider as well.   To learn more about what you can do with MyChart, go to [email or web address].      Other Instructions  Okay to preceded with surgery from a cardiovascular standpoint.

## 2020-08-05 NOTE — Addendum Note (Signed)
Addended by: Birder Robson on: 08/05/2020 03:56 PM   Modules accepted: Orders

## 2020-08-05 NOTE — Progress Notes (Signed)
Cardiology Office Note:    Date:  08/05/2020   ID:  Brandi Weaver, DOB [birth year], MRN [id number]  PCP:  Wyline Copas Hewitt Shorts, PA-C  Cardiologist:  None  Electrophysiologist:  None   Referring MD: Wyline Copas Hewitt Shorts, PA-C   No chief complaint on file. I have eye surgery plan  History of Present Illness:    Brandi Weaver is a 55 y.o. female with a hx of coronary artery disease, cardiomyopathy EF 45 to 50% on echocardiogram done April 28, 2020, tobacco use, obesity. The patient usually follows with [name] and was last seen by him in October 2021 at that time it appears that she was tolerating her medications.  No changes were made to her regimen.  The patient tells me she comes today for follow-up visit because she is spending surgery at [facility].  She denies any chest pain any shortness of breath.  She tells me that she usually does her daily activities the only thing that limits her is her back pain she is standing for a long time.  She also recently completed her chemotherapy for her breast cancer.  Past Medical History:  Diagnosis Date   CAD (coronary artery disease) 09/06/2019   Cardiomyopathy ([place]) 06/26/2019   Chest pain on exertion 10/18/2016   Hypertension 10/18/2016   Malignant neoplasm of left breast ([place])    Malignant neoplasm of left female breast ([place]) 11/23/2019   Morbid (severe) obesity due to excess calories ([place]) 06/25/2019   Formatting of this note might be different from the original. per last BMI of 41.64 on 02/13/2018   Tobacco use disorder 10/19/2016    Past Surgical History:  Procedure Laterality Date   ABLATION     TUBAL LIGATION      Current Medications: Current Meds  Medication Sig   aspirin EC 81 MG tablet Take 81 mg by mouth daily. Swallow whole.   atorvastatin (LIPITOR) 20 MG tablet Take 1 tablet (20 mg total) by mouth daily.   cetirizine (ZYRTEC) 10 MG tablet Take 10 mg by mouth daily.   gabapentin (NEURONTIN) 600 MG tablet Take  600 mg by mouth daily.   hydrOXYzine (ATARAX/VISTARIL) 25 MG tablet Take 25 mg by mouth 4 (four) times daily as needed.   loratadine (CLARITIN) 10 MG tablet Take 10 mg by mouth daily.   methimazole (TAPAZOLE) 10 MG tablet Take by mouth.   nitroGLYCERIN (NITROSTAT) 0.4 MG SL tablet PLACE 1 TABLET UNDER THE TONGUE EVERY 5 (FIVE) MINUTES AS NEEDED.   ondansetron (ZOFRAN) 4 MG tablet Take 4 mg by mouth every 4 (four) hours as needed.   Oxycodone HCl 10 MG TABS Take 10 mg by mouth 4 (four) times daily as needed.   prochlorperazine (COMPAZINE) 10 MG tablet Take 10 mg by mouth every 6 (six) hours as needed.   sacubitril-valsartan (ENTRESTO) 24-26 MG Take 1 tablet by mouth 2 (two) times daily. (Patient taking differently: Take 1 tablet by mouth daily.)   traZODone (DESYREL) 50 MG tablet Take 50 mg by mouth at bedtime as needed.   TYLENOL 500 MG tablet Take 1,000 mg by mouth every 8 (eight) hours as needed.   venlafaxine XR (EFFEXOR-XR) 75 MG 24 hr capsule Take 75 mg by mouth at bedtime.     Allergies:   Sulfa antibiotics   Social History   Socioeconomic History   Marital status: Widowed    Spouse name: Not on file   Number of children: Not on file   Years  of education: Not on file   Highest education level: Not on file  Occupational History   Not on file  Tobacco Use   Smoking status: Former    Pack years: 0.00    Types: Cigarettes    Quit date: 06/05/2019    Years since quitting: 1.1   Smokeless tobacco: Never  Vaping Use   Vaping Use: Never used  Substance and Sexual Activity   Alcohol use: Not Currently   Drug use: Never   Sexual activity: Not Currently  Other Topics Concern   Not on file  Social History Narrative   Not on file   Social Determinants of Health   Financial Resource Strain: Not on file  Food Insecurity: Not on file  Transportation Needs: Not on file  Physical Activity: Not on file  Stress: Not on file  Social Connections: Not on file     Family  History: The patient's family history includes Heart disease in her father; Hypertension in her father; Stroke in her father; Throat cancer in her mother.  ROS:   Review of Systems  Constitution: Negative for decreased appetite, fever and weight gain.  HENT: Negative for congestion, ear discharge, hoarse voice and sore throat.   Eyes: Negative for discharge, redness, vision loss in right eye and visual halos.  Cardiovascular: Negative for chest pain, dyspnea on exertion, leg swelling, orthopnea and palpitations.  Respiratory: Negative for cough, hemoptysis, shortness of breath and snoring.   Endocrine: Negative for heat intolerance and polyphagia.  Hematologic/Lymphatic: Negative for bleeding problem. Does not bruise/bleed easily.  Skin: Negative for flushing, nail changes, rash and suspicious lesions.  Musculoskeletal: Negative for arthritis, joint pain, muscle cramps, myalgias, neck pain and stiffness.  Gastrointestinal: Negative for abdominal pain, bowel incontinence, diarrhea and excessive appetite.  Genitourinary: Negative for decreased libido, genital sores and incomplete emptying.  Neurological: Negative for brief paralysis, focal weakness, headaches and loss of balance.  Psychiatric/Behavioral: Negative for altered mental status, depression and suicidal ideas.  Allergic/Immunologic: Negative for HIV exposure and persistent infections.    EKGs/Labs/Other Studies Reviewed:    The following studies were reviewed today:   EKG:  The ekg ordered today demonstrates sinus rhythm, heart rate 88 bpm with poor R wave progression cannot rule out anterior infarction compared to prior EKG no significant change.  Transthoracic echocardiogram April 28, 2020 IMPRESSIONS   1. GLS: -7.8. Left ventricular ejection fraction, by estimation, is 45 to  50%. The left ventricle has mildly decreased function. The left ventricle  has no regional wall motion abnormalities. The left ventricular internal   cavity size was mildly to  moderately dilated. Left ventricular diastolic parameters were normal.   2. Right ventricular systolic function is normal. The right ventricular  size is normal. There is normal pulmonary artery systolic pressure.   3. The mitral valve is normal in structure. No evidence of mitral valve  regurgitation. No evidence of mitral stenosis.   4. The aortic valve is normal in structure. Aortic valve regurgitation is  not visualized. No aortic stenosis is present.   5. The inferior vena cava is normal in size with greater than 50%  respiratory variability, suggesting right atrial pressure of 3 mmHg.   FINDINGS   Left Ventricle: GLS: -7.8. Left ventricular ejection fraction, by  estimation, is 45 to 50%. The left ventricle has mildly decreased  function. The left ventricle has no regional wall motion abnormalities.  The left ventricular internal cavity size was  mildly to moderately dilated. There is  no left ventricular hypertrophy.  Left ventricular diastolic parameters were normal.   Right Ventricle: The right ventricular size is normal. No increase in  right ventricular wall thickness. Right ventricular systolic function is  normal. There is normal pulmonary artery systolic pressure. The tricuspid  regurgitant velocity is 2.28 m/s, and   with an assumed right atrial pressure of 3 mmHg, the estimated right  ventricular systolic pressure is 38.1 mmHg.   Left Atrium: Left atrial size was normal in size.   Right Atrium: Right atrial size was normal in size.   Pericardium: There is no evidence of pericardial effusion.   Mitral Valve: The mitral valve is normal in structure. No evidence of  mitral valve regurgitation. No evidence of mitral valve stenosis.   Tricuspid Valve: The tricuspid valve is normal in structure. Tricuspid  valve regurgitation is mild . No evidence of tricuspid stenosis.   Aortic Valve: The aortic valve is normal in structure. Aortic valve   regurgitation is not visualized. No aortic stenosis is present.   Pulmonic Valve: The pulmonic valve was normal in structure. Pulmonic valve  regurgitation is not visualized. No evidence of pulmonic stenosis.   Aorta: The aortic root is normal in size and structure.   Venous: The inferior vena cava is normal in size with greater than 50%  respiratory variability, suggesting right atrial pressure of 3 mmHg.   IAS/Shunts: No atrial level shunt detected by color flow Doppler.  Recent Labs: 12/07/2019: TSH 0.208 07/30/2020: ALT 21; BUN 17; Creatinine 0.9; Hemoglobin 13.5; Platelets 249; Potassium 3.9; Sodium 141  Recent Lipid Panel    Component Value Date/Time   CHOL 230 (H) 12/07/2019 1002   TRIG 205 (H) 12/07/2019 1002   HDL 41 12/07/2019 1002   CHOLHDL 5.6 (H) 12/07/2019 1002   LDLCALC 152 (H) 12/07/2019 1002    Physical Exam:    VS:  BP 130/90   Pulse 88   Ht 5\' 7"  (1.702 m)   Wt 229 lb 12.8 oz (104.2 kg)   SpO2 98%   BMI 35.99 kg/m     Wt Readings from Last 3 Encounters:  08/05/20 229 lb 12.8 oz (104.2 kg)  08/01/20 230 lb (104.3 kg)  07/30/20 228 lb 9.6 oz (103.7 kg)     GEN: Well nourished, well developed in no acute distress HEENT: Normal NECK: No JVD; No carotid bruits LYMPHATICS: No lymphadenopathy CARDIAC: S1S2 noted,RRR, no murmurs, rubs, gallops RESPIRATORY:  Clear to auscultation without rales, wheezing or rhonchi  ABDOMEN: Soft, non-tender, non-distended, +bowel sounds, no guarding. EXTREMITIES: No edema, No cyanosis, no clubbing MUSCULOSKELETAL:  No deformity  SKIN: Warm and dry NEUROLOGIC:  Alert and oriented x 3, non-focal PSYCHIATRIC:  Normal affect, good insight  ASSESSMENT:    1. Hypertension, unspecified type   2. Dilated cardiomyopathy ([place])   3. Coronary artery disease involving native coronary artery of native heart without angina pectoris   4. Tobacco use disorder   5. Morbid (severe) obesity due to excess calories ([place])   6.  Depressed left ventricular ejection fraction    PLAN:     The patient does not have any unstable cardiac conditions.  Upon evaluation today, she can achieve 4 METs or greater without anginal symptoms.  According to [facility] and AHA guidelines, she requires no further cardiac workup prior to her noncardiac surgery and should be at acceptable risk.    No angina symptoms at this time continue patient on her current dose of aspirin 81 mg daily with her  Lipitor 20 mg daily.  Her recent echo shows that her EF is still mildly depressed she will continue her current dose of Entresto 24-26 mg grams twice a day.  Optimization of her medications can be done postsurgery after she sees her primary cardiologist as well.  The patient understands the need to lose weight with diet and exercise. We have discussed specific strategies for this. Continued smoking cessation advised-patient reports that she quit smoking in April 2021.  The patient is in agreement with the above plan. The patient left the office in stable condition.  The patient will follow up in in 3 months with [name].   Medication Adjustments/Labs and Tests Ordered: Current medicines are reviewed at length with the patient today.  Concerns regarding medicines are outlined above.  No orders of the defined types were placed in this encounter.  No orders of the defined types were placed in this encounter.   There are no Patient Instructions on file for this visit.   Adopting a Healthy Lifestyle.  Know what a healthy weight is for you (roughly BMI <25) and aim to maintain this   Aim for 7+ servings of fruits and vegetables daily   65-80+ fluid ounces of water or unsweet tea for healthy kidneys   Limit to max 1 drink of alcohol per day; avoid smoking/tobacco   Limit animal fats in diet for cholesterol and heart health - choose grass fed whenever available   Avoid highly processed foods, and foods high in saturated/trans fats   Aim for  low stress - take time to unwind and care for your mental health   Aim for 150 min of moderate intensity exercise weekly for heart health, and weights twice weekly for bone health   Aim for 7-9 hours of sleep daily   When it comes to diets, agreement about the perfect plan isnt easy to find, even among the experts. Experts at the [employer] developed an idea known as the Healthy Eating Plate. Just imagine a plate divided into logical, healthy portions.   The emphasis is on diet quality:   Load up on vegetables and fruits - one-half of your plate: Aim for color and variety, and remember that potatoes dont count.   Go for whole grains - one-quarter of your plate: Whole wheat, barley, wheat berries, quinoa, oats, brown rice, and foods made with them. If you want pasta, go with whole wheat pasta.   Protein power - one-quarter of your plate: Fish, chicken, beans, and nuts are all healthy, versatile protein sources. Limit red meat.   The diet, however, does go beyond the plate, offering a few other suggestions.   Use healthy plant oils, such as olive, canola, soy, corn, sunflower and peanut. Check the labels, and avoid partially hydrogenated oil, which have unhealthy trans fats.   If youre thirsty, drink water. Coffee and tea are good in moderation, but skip sugary drinks and limit milk and dairy products to one or two daily servings.   The type of carbohydrate in the diet is more important than the amount. Some sources of carbohydrates, such as vegetables, fruits, whole grains, and beans-are healthier than others.   Finally, stay active  Signed, Berniece Salines, DO  08/05/2020 2:46 PM    [facility]

## 2020-09-10 NOTE — Telephone Encounter (Signed)
   [facility] called, pt is scheduled for procedure on 09/25/20 they need clearance and when the pt can hold aspirin

## 2020-09-11 NOTE — Telephone Encounter (Signed)
   Name: Keeana Bearfield  DOB: [birth year]  MRN: [id number]  Primary Cardiologist: Dr. [name], MD   Chart reviewed as part of pre-operative protocol coverage. Because of Brylen Koupal Pilat's past medical history and time since last visit, she will require a follow-up visit in order to better assess preoperative cardiovascular risk.  Pre-op covering staff: - Please schedule appointment and call patient to inform them. If patient already had an upcoming appointment within acceptable timeframe, please add "pre-op clearance" to the appointment notes so provider is aware. - Please contact requesting surgeon's office via preferred method (i.e, phone, fax) to inform them of need for appointment prior to surgery.  If applicable, this message will also be routed to pharmacy pool and/or primary cardiologist for input on holding anticoagulant/antiplatelet agent as requested below so that this information is available to the clearing provider at time of patient's appointment.   Kathyrn Drown, NP  09/11/2020, 4:24 PM

## 2020-09-11 NOTE — Telephone Encounter (Signed)
Dr. Harriet Masson  Looks like you have already seen this patient in follow up 07/2020 for preoperative clearance. Wanted to make sure that you are ok with holding ASA 3-5 days prior to procedure? She has a hx of CAD per CTA with FFR significant LAD stenosis with recommendations to continue with goal directed medical therapy and aggressive risk factor modification for secondary prevention of coronary artery disease.   Please send your recommendations to the pre-op pool  Thank you  Sharee Pimple

## 2020-09-12 NOTE — Telephone Encounter (Signed)
    Patient Name: Brandi Weaver  DOB: July 12, 1965 MRN: [id number]  Primary Cardiologist: Dr. [name], MD   Chart reviewed as part of pre-operative protocol coverage. Given past medical history and time since last visit, based on ACC/AHA guidelines, Brandi Weaver would be at acceptable risk for the planned procedure without further cardiovascular testing.   Pt seen for pre-op clearance with Dr. Harriet Masson and was ultimately cleared for surgery. ASA holding recommendations were not clearly stated therefore per Dr. [name] "I think aspirin can be withheld if there is no choice for the shortest period of time that it is necessary to be held." Will forward Dr. Terrial Rhodes clearance note as well.   The patient was advised that if she develops new symptoms prior to surgery to contact our office to arrange for a follow-up visit, and she verbalized understanding.  I will route this recommendation to the requesting party via Epic fax function and remove from pre-op pool.  Please call with questions.  Kathyrn Drown, NP 09/12/2020, 7:36 AM

## 2020-09-16 NOTE — Telephone Encounter (Signed)
Follow up:    Brandi Weaver from [facility] calling to check the status of the patient clearance. Surgery will coming up soon. Please advise

## 2020-09-18 NOTE — Telephone Encounter (Addendum)
I am coming on board to cover pre-op today. It appears clearance was already taken care of 09/12/20, will route to callback team to assist in making sure requesting provider acknowledges receipt.

## 2020-09-18 NOTE — Telephone Encounter (Signed)
I was asked by operator team to take a follow-up call regarding this. Spoke with Brandi Weaver at [facility] to help clarify her question - reiterated clearance decision below. No further action needed.

## 2020-10-29 ENCOUNTER — Other Ambulatory Visit: Payer: Self-pay | Admitting: Oncology

## 2020-10-29 DIAGNOSIS — Z171 Estrogen receptor negative status [ER-]: Secondary | ICD-10-CM

## 2020-10-29 DIAGNOSIS — C50012 Malignant neoplasm of nipple and areola, left female breast: Secondary | ICD-10-CM

## 2020-10-30 ENCOUNTER — Inpatient Hospital Stay: Payer: Medicaid Other | Attending: Oncology | Admitting: Oncology

## 2020-10-30 ENCOUNTER — Inpatient Hospital Stay: Payer: Medicaid Other

## 2020-10-30 DIAGNOSIS — C50012 Malignant neoplasm of nipple and areola, left female breast: Secondary | ICD-10-CM

## 2020-11-03 ENCOUNTER — Ambulatory Visit: Payer: Medicaid Other | Admitting: Oncology

## 2020-11-03 ENCOUNTER — Other Ambulatory Visit: Payer: Medicaid Other

## 2020-11-12 ENCOUNTER — Encounter: Payer: Self-pay | Admitting: Oncology

## 2020-11-18 ENCOUNTER — Other Ambulatory Visit: Payer: Self-pay

## 2020-11-18 ENCOUNTER — Inpatient Hospital Stay: Payer: Medicaid Other

## 2020-11-18 ENCOUNTER — Encounter: Payer: Self-pay | Admitting: Hematology and Oncology

## 2020-11-18 ENCOUNTER — Inpatient Hospital Stay: Payer: Medicaid Other | Attending: Oncology | Admitting: Hematology and Oncology

## 2020-11-18 ENCOUNTER — Telehealth: Payer: Self-pay | Admitting: Hematology and Oncology

## 2020-11-18 DIAGNOSIS — C50412 Malignant neoplasm of upper-outer quadrant of left female breast: Secondary | ICD-10-CM | POA: Diagnosis present

## 2020-11-18 DIAGNOSIS — C50112 Malignant neoplasm of central portion of left female breast: Secondary | ICD-10-CM

## 2020-11-18 DIAGNOSIS — C50012 Malignant neoplasm of nipple and areola, left female breast: Secondary | ICD-10-CM

## 2020-11-18 DIAGNOSIS — Z452 Encounter for adjustment and management of vascular access device: Secondary | ICD-10-CM | POA: Insufficient documentation

## 2020-11-18 DIAGNOSIS — Z171 Estrogen receptor negative status [ER-]: Secondary | ICD-10-CM

## 2020-11-18 LAB — COMPREHENSIVE METABOLIC PANEL
Albumin: 4.1 (ref 3.5–5.0)
Calcium: 9.6 (ref 8.7–10.7)

## 2020-11-18 LAB — BASIC METABOLIC PANEL
BUN: 17 (ref 4–21)
CO2: 23 — AB (ref 13–22)
Chloride: 107 (ref 99–108)
Creatinine: 1 (ref 0.5–1.1)
Glucose: 102
Potassium: 4.1 (ref 3.4–5.3)
Sodium: 138 (ref 137–147)

## 2020-11-18 LAB — CBC AND DIFFERENTIAL
HCT: 37 (ref 36–46)
Hemoglobin: 12.2 (ref 12.0–16.0)
Neutrophils Absolute: 4.62
Platelets: 262 (ref 150–399)
WBC: 6.6

## 2020-11-18 LAB — HEPATIC FUNCTION PANEL
ALT: 18 (ref 7–35)
AST: 27 (ref 13–35)
Alkaline Phosphatase: 82 (ref 25–125)
Bilirubin, Total: 0.7

## 2020-11-18 LAB — CBC: RBC: 4.12 (ref 3.87–5.11)

## 2020-11-18 MED ORDER — SODIUM CHLORIDE 0.9% FLUSH
10.0000 mL | INTRAVENOUS | Status: AC | PRN
  Administered 2020-11-18: 10 mL

## 2020-11-18 MED ORDER — HEPARIN SOD (PORK) LOCK FLUSH 100 UNIT/ML IV SOLN
500.0000 [IU] | Freq: Once | INTRAVENOUS | Status: AC | PRN
  Administered 2020-11-18: 500 [IU]

## 2020-11-18 NOTE — Progress Notes (Signed)
[place]  [address] 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 No primary breast lesion was seen. MRI breast revealed a 1 x 1.2 cm oval enhancing mass within the posterior upper outer left breast felt to represent an abnormal intraparenchymal lymph node. No other suspicious abnormalities are identified within the left breast. There were 5 enlarged level 1 left axillary lymph nodes. She was  treated with neoadjuvant chemotherapy with [facility] for 6 cycles. Pertuzumab was not given due to pre-existing cardiomyopathy. Upon completion of neoadjuvant treatment, she underwent ultrasound guided left breast biopsy of the lesion seen on breast MRI. This revealed lymphoid tissue with reactive lymphoid hyperplasia and was negative for granulomas or malignancy. She then underwent left axillary lymph node dissection in October 2021. Surgical pathology from this procedure found tumor present in 3 of 27 lymph nodes (3/27).  One lymph node was positive for macrometastasis, 50m. Two lymph nodes had micrometastasis. She received adjuvant radiation and completed 1 year of adjuvant trastuzumab in June. Mammogram in June did not reveal any evidence of malignancy. She remains without evidence of recurrence. We will flush her port today. She can have that removed at any time. We will plan to see her back in 3 months with a CBC and comprehensive metabolic profile for repeat clinical assessment.    The patient understands the plans discussed today and is in agreement with them.  She knows to contact our office if she develops concerns prior to her next appointment.     KMarvia Pickles PA-C  [facility]AT [facility]Dept: [phone] Fax: [phone]  Orders Placed This Encounter  Procedures   Basic metabolic panel    This external order was created through the Results Console.   Comprehensive metabolic panel    This external order was created through the Results Console.   Hepatic function panel    This external order was created through the Results Console.       CHIEF COMPLAINT:  CC: Stage IIA HER2 positive breast cancer  Current Treatment:  Observation   HISTORY OF PRESENT ILLNESS:   Oncology History  [place]align[place] breast ([place]  06/01/2019 Cancer Staging   Staging form: Breast, AJCC 8th Edition - Clinical stage from 06/01/2019: cT0, cN1(f), cM0, ER-, PR-, HER2+ - Signed by MDerwood Kaplan MD on 12/31/2019   09/05/2019 Initial Diagnosis   Malignant [place] of left breast ([place]   11/22/2019 Cancer Staging   Staging form: Breast, AJCC 8th Edition - Pathologic stage from 11/22/2019: No Stage Recommended (ypT0, pN1, cM0, ER-, PR-, HER2+) - Signed by MDerwood Kaplan MD on 12/31/2019 Prognostic indicators: 3/27 nodes pos., no primary found   07/16/2020 Imaging   Bilateral diagnostic mammogram:  FINDINGS:  Surgical changes are seen in the left axilla. There are changes of  radiation therapy in the left breast. No suspicious mass or  malignant type microcalcifications identified in either breast.  IMPRESSION:  No evidence of malignancy in either breast.  RECOMMENDATION:  Bilateral diagnostic mammogram in 1 year is recommended.    [place]align[place] of left female breast ([place] [place]  06/22/2019 -  08/01/2020 Chemotherapy   Patient is on Treatment Plan : BREAST  Trastuzumab Maintenance q21d     11/23/2019 Initial Diagnosis   Malignant neoplasm of left female breast ([place])   07/16/2020 Imaging   Bilateral diagnostic mammogram:  FINDINGS:  Surgical changes are seen in the left  axilla. There are changes of  radiation therapy in the left breast. No suspicious mass or  malignant type microcalcifications identified in either breast.  IMPRESSION:  No evidence of malignancy in either breast.  RECOMMENDATION:  Bilateral diagnostic mammogram in 1 year is recommended.       INTERVAL HISTORY:  Eulene is here today for repeat clinical assessment and states she has been doing fairly well. 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 She states her dry eye and diplopia have improved. She reports a persistent hand tremor, for which she is on propranolol 40 mg daily without resolution. She is to follow up with her provider regarding this. She denies any changes in her breasts. She denies fevers or chills. She reports chronic back pain demonstrated to be from degenerative disease. She states she has been referred to [facility]. Her appetite is good. Her weight has been stable.  REVIEW OF SYSTEMS:  Review of Systems  Constitutional:  Negative for appetite change, chills, fatigue, fever and unexpected weight change.  HENT:   Negative for lump/mass, mouth sores and sore throat.   Eyes:  Positive for eye problems (dry eye and diplopia due to thyroid eye disease, improving since surgery).  Respiratory:  Negative for cough and shortness of breath.   Cardiovascular:  Negative for chest pain and leg swelling.  Gastrointestinal:  Negative for abdominal pain, constipation, diarrhea, nausea and vomiting.  Genitourinary:  Negative for difficulty urinating, dysuria, frequency and hematuria.   Musculoskeletal:  Positive for back pain (chronic, stable). Negative for arthralgias and myalgias.  Skin:  Negative for rash.  Neurological:  Positive for numbness (bilateral fingerips, chronic, stable). Negative for dizziness and headaches.  Hematological:  Negative for adenopathy. Does not bruise/bleed easily.  Psychiatric/Behavioral:  Negative for depression and sleep  disturbance. The patient is not nervous/anxious.     VITALS:  Blood pressure 119/75, pulse 73, temperature 98.6 F (37 C), temperature source Oral, resp. rate 18, height '5\' 7"'  (1.702 m), weight 230 lb 12 oz (104.7 kg), SpO2 97 %.  Wt Readings from Last 3 Encounters:  11/18/20 230 lb 12 oz (104.7 kg)  08/05/20 229 lb 12.8 oz (104.2 kg)  08/01/20 230 lb (104.3 kg)    Body mass index is 36.14 kg/m.  Performance status (ECOG): 1 - Symptomatic but completely ambulatory  PHYSICAL EXAM:  Physical Exam Vitals and nursing note reviewed.  Constitutional:      General: She is not in acute distress.    Appearance: Normal appearance.  HENT:     Head: Normocephalic and atraumatic.     Mouth/Throat:     Mouth: Mucous membranes are moist.     Pharynx: Oropharynx is clear. No oropharyngeal exudate or posterior oropharyngeal erythema.  Eyes:     General: No scleral icterus.    Extraocular Movements: Extraocular movements intact.     Conjunctiva/sclera: Conjunctivae normal.     Pupils: Pupils are equal, round, and reactive to light.  Cardiovascular:     Rate and Rhythm: Normal rate and regular rhythm.     Heart sounds: Normal heart sounds. No murmur heard.   No friction rub. No gallop.  Pulmonary:     Effort:  Pulmonary effort is normal.     Breath sounds: Normal breath sounds. No wheezing, rhonchi or rales.  Chest:  Breasts:    Right: Normal. No swelling, bleeding, inverted nipple, mass, nipple discharge, skin change or tenderness.     Left: Skin change (persistent radiation skin changes with hyperpigmentation and edema of the skin) present. No swelling, bleeding, inverted nipple, mass, nipple discharge or tenderness.  Abdominal:     General: There is no distension.     Palpations: Abdomen is soft. There is no hepatomegaly, splenomegaly or mass.     Tenderness: There is no abdominal tenderness.  Musculoskeletal:        General: Normal range of motion.     Cervical back: Normal range of  motion and neck supple. No tenderness.     Right lower leg: No edema.     Left lower leg: No edema.  Lymphadenopathy:     Cervical: No cervical adenopathy.     Upper Body:     Right upper body: No supraclavicular or axillary adenopathy.     Left upper body: No supraclavicular or axillary adenopathy.     Lower Body: No right inguinal adenopathy. No left inguinal adenopathy.  Skin:    General: Skin is warm and dry.     Coloration: Skin is not jaundiced.     Findings: No rash.  Neurological:     Mental Status: She is alert and oriented to person, place, and time.     Cranial Nerves: No cranial nerve deficit.  Psychiatric:        Mood and Affect: Mood normal.        Behavior: Behavior normal.        Thought Content: Thought content normal.   LABS:   CBC Latest Ref Rng & Units 11/18/2020 07/30/2020 07/09/2020  WBC - 6.6 6.2 6.7  Hemoglobin 12.0 - 16.0 12.2 13.5 12.4  Hematocrit 36 - 46 37 41 38  Platelets 150 - 399 262 249 236   CMP Latest Ref Rng & Units 11/18/2020 07/30/2020 07/09/2020  Glucose 70 - 99 mg/dL - - -  BUN 4 - '21 17 17 14  ' Creatinine 0.5 - 1.1 1.0 0.9 1.0  Sodium 137 - 147 138 141 140  Potassium 3.4 - 5.3 4.1 3.9 3.6  Chloride 99 - 108 107 106 107  CO2 13 - 22 23(A) 24(A) 24(A)  Calcium 8.7 - 10.7 9.6 9.6 9.7  Total Protein 6.5 - 8.1 g/dL - - -  Total Bilirubin 0.3 - 1.2 mg/dL - - -  Alkaline Phos 25 - 125 82 73 76  AST 13 - 35 '27 27 31  ' ALT 7 - 35 '18 21 18     ' No results found for: CEA1 / No results found for: CEA1 No results found for: PSA1 No results found for: HTX774 No results found for: FSE395  No results found for: TOTALPROTELP, ALBUMINELP, A1GS, A2GS, BETS, BETA2SER, GAMS, MSPIKE, SPEI No results found for: TIBC, FERRITIN, IRONPCTSAT No results found for: LDH  STUDIES:  No results found.    HISTORY:   Past Medical History:  Diagnosis Date   CAD (coronary artery disease) 09/06/2019   Cardiomyopathy ([place]) 06/26/2019   Chest pain on exertion  10/18/2016   Hypertension 10/18/2016   Malignant neoplasm of left breast [facility])    Malignant neoplasm of left female breast ([place]) 11/23/2019   Morbid (severe) obesity due to excess calories ([place]) 06/25/2019   Formatting of this note might be different from  the original. per last BMI of 41.64 on 02/13/2018   Tobacco use disorder 10/19/2016    Past Surgical History:  Procedure Laterality Date   ABLATION     TUBAL LIGATION      Family History  Problem Relation Age of Onset   Throat cancer Mother    Hypertension Father    Stroke Father    Heart disease Father     Social History:  reports that she quit smoking about 17 months ago. Her smoking use included cigarettes. She has never used smokeless tobacco. She reports that she does not currently use alcohol. She reports that she does not use drugs.The patient is alone today.  Allergies:  Allergies  Allergen Reactions   Sulfa Antibiotics Hives    Current Medications: Current Outpatient Medications  Medication Sig Dispense Refill   propranolol (INDERAL) 40 MG tablet Take 40 mg by mouth daily.     aspirin EC 81 MG tablet Take 81 mg by mouth daily. Swallow whole.     atorvastatin (LIPITOR) 20 MG tablet Take 1 tablet (20 mg total) by mouth daily. 90 tablet 3   cetirizine (ZYRTEC) 10 MG tablet Take 10 mg by mouth daily.     cyanocobalamin (,VITAMIN B-12,) 1000 MCG/ML injection Inject 1,000 mcg into the muscle every 30 (thirty) days.     gabapentin (NEURONTIN) 600 MG tablet Take 600 mg by mouth daily.     hydrOXYzine (ATARAX/VISTARIL) 25 MG tablet Take 25 mg by mouth 4 (four) times daily as needed.     loratadine (CLARITIN) 10 MG tablet Take 10 mg by mouth daily.     methimazole (TAPAZOLE) 10 MG tablet Take by mouth.     nitroGLYCERIN (NITROSTAT) 0.4 MG SL tablet PLACE 1 TABLET UNDER THE TONGUE EVERY 5 (FIVE) MINUTES AS NEEDED. 75 tablet 2   ondansetron (ZOFRAN) 4 MG tablet Take 4 mg by mouth every 4 (four) hours as needed.     Oxycodone  HCl 10 MG TABS Take 10 mg by mouth 4 (four) times daily as needed.     prochlorperazine (COMPAZINE) 10 MG tablet Take 10 mg by mouth every 6 (six) hours as needed.     sacubitril-valsartan (ENTRESTO) 24-26 MG Take 1 tablet by mouth 2 (two) times daily. (Patient taking differently: Take 1 tablet by mouth daily.) 60 tablet 3   traZODone (DESYREL) 50 MG tablet Take 50 mg by mouth at bedtime as needed.     TYLENOL 500 MG tablet Take 1,000 mg by mouth every 8 (eight) hours as needed.     venlafaxine XR (EFFEXOR-XR) 75 MG 24 hr capsule Take 75 mg by mouth at bedtime.     No current facility-administered medications for this visit.   Facility-Administered Medications Ordered in Other Visits  Medication Dose Route Frequency Provider Last Rate Last Admin   sodium chloride flush (NS) 0.9 % injection 10 mL  10 mL Intracatheter PRN Normal Recinos A, PA-C   10 mL at 11/18/20 1123

## 2020-11-18 NOTE — Assessment & Plan Note (Addendum)
History of stage IIA HER2 positive left breast cancer with metastasis to the left axilla diagnosed in April 2021. No primary breast lesion was seen. MRI breast revealed a 1 x 1.2 cm oval enhancing mass within the posterior upper outer left breast felt to represent an abnormal intraparenchymal lymph node. No other suspicious abnormalities are identified within the left breast. There were 5 enlarged level 1 left axillary lymph nodes. She was  treated with neoadjuvant chemotherapy with [facility] for 6 cycles. Pertuzumab was not given due to pre-existing cardiomyopathy. Upon completion of neoadjuvant treatment, she underwent ultrasound guided left breast biopsy of the lesion seen on breast MRI. This revealed lymphoid tissue with reactive lymphoid hyperplasia and was negative for granulomas or malignancy. She then underwent left axillary lymph node dissection in October 2021. Surgical pathology from this procedure found tumor present in 3 of 27 lymph nodes (3/27).  One lymph node was positive for macrometastasis, 66m. Two lymph nodes had micrometastasis. She received adjuvant radiation and completed 1 year of adjuvant trastuzumab in June. Mammogram in June did not reveal any evidence of malignancy. She remains without evidence of recurrence. We will flush her port today. She can have that removed at any time. We will plan to see her back in 3 months with a CBC and comprehensive metabolic profile for repeat clinical assessment.

## 2020-11-18 NOTE — Telephone Encounter (Signed)
11/18/20 Spoke with patient and scheduled next appt

## 2020-11-20 ENCOUNTER — Encounter: Payer: Self-pay | Admitting: Hematology and Oncology

## 2020-11-27 ENCOUNTER — Telehealth: Payer: Self-pay | Admitting: Cardiology

## 2020-11-27 NOTE — Telephone Encounter (Signed)
[employer] is wanting to know if pt has any work restrictions... please advise

## 2021-01-27 ENCOUNTER — Encounter: Payer: Self-pay | Admitting: Hematology and Oncology

## 2021-02-11 ENCOUNTER — Encounter: Payer: Self-pay | Admitting: Hematology and Oncology

## 2021-02-13 NOTE — Progress Notes (Signed)
[name]  [address] [place],  [place]  [address] [phone]  Clinic Day:  02/18/2021  Referring physician: Wyline Copas Hewitt Shorts, PA-C  This document serves as a record of services personally performed by Hosie Poisson, MD. It was created on their behalf by [facility], a trained medical scribe. The creation of this record is based on the scribe's personal observations and the provider's statements to them.  ASSESSMENT & PLAN:   Assessment & Plan: 1. 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  No primary breast lesion was found.  She was treated with neoadjuvant docetaxel/carboplatin/trastuzumab, left axillary lymph node dissection and adjuvant radiation therapy to the left breast and axilla. She completed 1 year of maintenance trastuzumab in June 2022. She remains without evidence of malignancy.   2. Cardiomyopathy with decreased left ventricular ejection fraction.  This is being managed by [name].  [name] from September revealed an ejection fraction of 39%, which was stable. Echocardiogram in November revealed an ejection fraction between 60-65%, which was a puzzle, as it has never been normal. Echocardiogram in March revealed an ejection fraction of 45-50%, which is closer to her baseline. She remains without symptoms of heart failure. [name] from April 2022 revealed stable left ventricular function, with an ejection fraction of 41%.   3. Bilateral hip and back pain, which is chronic.  MRI lumbar spine and left hip did not reveal any evidence of malignancy.  There were degenerative changes in the lumbar spine at L5-S1 years ago. I am concerned about the possibility of spinal stenosis with her frequent falls. Unfortunately she will need to back off the NSAIDs due to her elevated creatinine. She is having significant back pain at this time and is being scheduled with an orthopedic  surgeon.  4. New elevation of creatinine to 1.4. I will give her copies of her labs to take to her primary care provider and have asked her to decrease her Motrin dose. If they repeat and they remain abnormal, she will need to stop it altogether.   We will flush her port today and she is scheduled to have this removed in March. I assured her that she can have that removed at any time since she has no evidence of disease. I have asked her to decrease her Motrin dose and will give her copies of her labs. We will plan to see her back in 5 months with CBC, CMP and bilateral mammogram for repeat clinical assessment. The patient understands the plans discussed today and is in agreement with them.  She knows to contact our office if she develops concerns prior to her next appointment.  I provided 15 minutes of face-to-face time during this this encounter and > 50% was spent counseling as documented under my assessment and plan.    [facility] [address] [place] [address] Dept: [phone] Dept Fax: [phone]   No orders of the defined types were placed in this encounter.   CHIEF COMPLAINT:  CC: Stage IIA HER2 positive breast cancer  Current Treatment:  Observation   HISTORY OF PRESENT ILLNESS:  Stage IIA (T0 N1 M0) HER2 receptor positive left breast cancer diagnosed in April 2021.  The patient felt a mass in the left axilla in March and reported this to her primary care provider.  Her last bilateral screening mammogram had been  in November 2019 with a diagnostic left mammogram and ultrasound in December which revealed probable inframammary lymph node over the upper outer quadrant.  Six-month follow-up was recommended. Ultrasound in March 2021 revealed multiple hypoechoic lesions of the left axilla with the largest up to 4.6 cm in diameter with another measuring 3.3 cm and another 2.6 cm.  The changes were  felt possibly represent lymphadenitis.  Bilateral diagnostic mammogram at [facility] March revealed at least 7 enlarged intramammary and axillary lymph nodes.  No primary breast lesion was found.  Targeted left breast ultrasound revealed an increase in the previously demonstrated 7 mm intramammary node at 1:30 o'clock measured 13 mm with diffuse cortical thickening.  There were multiple enlarged left axillary nodes with eccentric cortical thickening with a maximum thickness of 19.8 mm.  Ultrasound-guided biopsy of the left axillary lymph node in April revealed metastatic carcinoma consistent with breast primary.  GATA 3 was positive with negative thyroglobulin, negative Napsin A and TTF1, and the PAX8 was equivocal.  Estrogen and progesterone receptors were negative and HER 2 positive.  Ki 67 was 40%.  Staging CT chest, abdomen and pelvis revealed bulky left axillary lymphadenopathy extending to the sub pectoralis nodal station with a 6 mm right upper paratracheal lymph node.  There was no evidence of metastatic disease in the lungs, abdomen or pelvis.  Breast MRI revealed a 1 x 1.2 oval enhancing mass within the posterior upper outer left breast felt to be an intraparenchymal lymph node, no breast masses were seen.  There was re-demonstration of the left axillary adenopathy.  On physical examination the left axillary adenopathy measured approximately 9 x 7 cm.  She had uterine ablation in 2011 for uterine fibroids, then experienced 1 day of uterine bleeding in 2012, but denies further bleeding.    We recommended neoadjuvant chemotherapy to include TCHP x 6 cycles with trastuzumab and pertuzumab for one full year.  Unfortunately, echocardiogram  In May revealed ejection fraction between 40 and 45%.  This was confirmed with [name] on May 10th, which revealed an ejection fraction of 42%.  Based on these findings, we changed the treatment regimen to docetaxel/carboplatin/trastuzumab [facility]) and eliminated  pertuzumab, due to the potential cardiotoxicities from HER2 targeted therapies.  We also recommended repeat cardiac imaging every 6 weeks and instead of the standard every 12 weeks.  She was referred to [name] and he started her on Entresto and has been monitoring her cardiac function.  She reported some chest tightness to him, so he recommended a [name].  She had quit smoking.  She received her 1st cycle of docetaxel, carboplatin and trastuzumab in May.  She experienced toxicities of decreased appetite, minimal nausea and vomiting, and generalized arthralgias.  Her biggest complaint was generalized achiness and hip pain, felt to be most likely due to [name].  Her pain was not controlled with hydrocodone/APAP 5/325 or 10/325, so she was given oxycodone 5 mg 1-2 tablets every 6 hours as needed for pain.  She was placed on esomeprazole 40 mg daily for acid reflux. She quit smoking in April 2021.   She continued Entresto daily.  She underwent nuclear stress test at [name] office in June, which revealed a fairly stable ejection fraction of 39%.  She was also recommended for CT coronary angiography, which is to be scheduled at [facility].  She underwent a coronary/cardiac CT in July revealed moderate CAD in proximal LAD and mild CAD in mid left main coronary artery, CADRADS = 3,  coronary calcium score is 168, which places the patient in the 85 percentile for age and dex matched control, normal coronary origin with right dominance and mildly dilated main pulmonary artery, 30 mm. [name] from August revealed a left ventricular ejection fraction of 39%, previously 42% in May, which was discussed with [name].  We discussed the risks and benefits with the patient again.  Overall, we feel that the benefit outweighs any further risk, as long as she is closely monitored.  Bilateral breast MRI in September revealed a decrease in the left axillary lymphadenopathy.  However, there was a persistent,  but smaller circumscribed oval mass in the upper outer quadrant of the left breast.  We discussed this with [name] of breast radiology and we suspect this could represent the breast primary, but biopsy was negative.  [name] imaging in September revealed a left ventricular fraction is 39%, which was stable.    Ultrasound guided left breast core needle biopsy in September revealed lymphoid tissue with reactive lymphoid hyperplasia and was negative for granulomas or malignancy.  No metastatic breast carcinoma could be seen.  She then underwent left axillary lymph node dissection on October 14th with [name]. Surgical pathology from this procedure found tumor present in 3 of 27 lymph nodes (3/27).  One lymph node was positive for macrometastasis measuring 74m.  Two lymph nodes had micrometastasis.  There were no lymph nodes with isolated tumor cells and no extranodal tumor seen. Echocardiogram in November revealed a normal ejection fraction between 60-65% , which was a puzzle, as very different from her baseline. She was referred to physical therapy for range of motion after her surgery. She started maintenance trastuzumab in November.  She continued to report persistent severe left hip pain, with mild pain in the right hip.  Previous left hip x-ray did not reveal any abnormality.  CT imaging in May also had not revealed any abnormality in the hips or lumbar spine.  She had not received chemotherapy or [name] since September, so we did not feel her pain can be attributed to this. MRI lumbar spine and left hip revealed mild-to-moderate facet arthropathy at L5-S1, which was worse on the right.  MRI left hip was negative. Her primary care provider referred her for physical therapy.   She has continued trastuzumab every 3 weeks.  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 Echocardiogram in March revealed an ejection fraction of  45-50%, which although decreased from November, was closer to her baseline. We decided to continue with trastuzumab and plan to repeat echocardiogram in 6 weeks.  Our pharmacist recommended a [name] in 6 weeks instead of echocardiogram. [name] scan on April 25th revealed a slight increase in the ejection fraction at 41%, so we have continued trastuzumab every 3 weeks.  Oncology History  Malign[facility] of left breast ([facility]  06/01/2019 Cancer Staging   Staging form: Breast, AJCC 8th Edition - Clinical stage from 06/01/2019: cT0, cN1(f), cM0, ER-, PR-, HER2+ - Signed by MDerwood Kaplan MD on 12/31/2019    09/05/2019 Initial Diagnosis   Malignant neoplasm of left breast ([facility]   11/22/2019 Cancer Staging   Staging form: Breast, AJCC 8th Edition - Pathologic stage from 11/22/2019: No Stage Recommended (ypT0, pN1, cM0, ER-, PR-, HER2+) - Signed by MDerwood Kaplan MD on 12/31/2019 Prognostic indicators: 3/27 nodes pos., no primary found    07/16/2020 Imaging   Bilateral diagnostic mammogram:  FINDINGS:  Surgical changes are seen in the left axilla. There are changes of  radiation therapy in the left breast. No suspicious mass or  malignant type microcalcifications identified in either breast.  IMPRESSION:  No evidence of malignancy in either breast.  RECOMMENDATION:  Bilateral diagnostic mammogram in 1 year is recommended.    Malignant neoplasm of left female breast ([place])  06/22/2019 - 08/01/2020 Chemotherapy   Patient is on Treatment Plan : BREAST  Trastuzumab Maintenance q21d     11/23/2019 Initial Diagnosis   Malignant neoplasm of left female breast ([place])   07/16/2020 Imaging   Bilateral diagnostic mammogram:  FINDINGS:  Surgical changes are seen in the left axilla. There are changes of  radiation therapy in the left breast. No suspicious mass or  malignant type microcalcifications identified in either breast.  IMPRESSION:  No evidence of malignancy in either breast.   RECOMMENDATION:  Bilateral diagnostic mammogram in 1 year is recommended.       INTERVAL HISTORY:  Brandi Weaver is here for routine follow up and has had multiple falls within the last 3 months. Three falls within the last week. She continues to have significant back pain which she rates as a 10/10 today as she did not take her pain medication this morning. She is on oxycodone and alternates this with Motrin so that she does not need as much of the opioid. She states that she is being scheduled with an orthopedic surgeon in the near future. She is scheduled for port removal in March with [name]. Blood counts and chemistries are unremarkable except for a creatinine of 1.4. She is taking Motrin up to 600 mg three times daily, and I advised that she decrease the dose due to an elevated creatinine. Her  appetite is good, and she has gained nearly 6 pounds since her last visit.  She denies fever, chills or other signs of infection.  She denies nausea, vomiting, bowel issues, or abdominal pain.  She denies sore throat, cough, dyspnea, or chest pain.  REVIEW OF SYSTEMS:  Review of Systems  Constitutional:  Positive for unexpected weight change. Negative for appetite change, chills, fatigue and fever.  HENT:  Negative.    Eyes: Negative.   Respiratory: Negative.  Negative for chest tightness, cough, hemoptysis, shortness of breath and wheezing.   Cardiovascular: Negative.  Negative for chest pain, leg swelling and palpitations.  Gastrointestinal: Negative.  Negative for abdominal distention, abdominal pain, blood in stool, constipation, diarrhea, nausea and vomiting.  Endocrine: Negative.   Genitourinary: Negative.  Negative for difficulty urinating, dysuria, frequency and hematuria.   Musculoskeletal:  Positive for back pain (chronic, severe) and gait problem (resulting in multiple falls). Negative for arthralgias, flank pain and myalgias.  Skin: Negative.   Neurological:  Positive for gait problem  (resulting in multiple falls). Negative for dizziness, extremity weakness, headaches, light-headedness, numbness, seizures and speech difficulty.  Hematological: Negative.   Psychiatric/Behavioral: Negative.  Negative for depression and sleep disturbance. The patient is not nervous/anxious.     VITALS:  Blood pressure 130/79, pulse 76, temperature 97.7 F (36.5 C), temperature source Oral, resp. rate 18, height _0  (1.702 m), weight 236 lb 4.8 oz (107.2 kg), SpO2 96 %.  Wt Readings from Last 3 Encounters:  02/18/21 236 lb 4.8 oz (107.2 kg)  11/18/20 230 lb 12 oz (104.7 kg)  08/05/20 229 lb 12.8 oz (104.2 kg)    Body mass index is 37.01 kg/m.  Performance status (ECOG): 1 - Symptomatic but completely  ambulatory  PHYSICAL EXAM:  Physical Exam Constitutional:      General: She is not in acute distress.    Appearance: Normal appearance. She is normal weight.  HENT:     Head: Normocephalic and atraumatic.  Eyes:     General: No scleral icterus.    Extraocular Movements: Extraocular movements intact.     Conjunctiva/sclera: Conjunctivae normal.     Pupils: Pupils are equal, round, and reactive to light.  Cardiovascular:     Rate and Rhythm: Normal rate and regular rhythm.     Pulses: Normal pulses.     Heart sounds: Normal heart sounds. No murmur heard.   No friction rub. No gallop.  Pulmonary:     Effort: Pulmonary effort is normal. No respiratory distress.     Breath sounds: Normal breath sounds.  Chest:     Comments: Radiation changes and hyperpigmentation of the left breast. No masses in either breast. Deep well healed scar in the left axilla. Abdominal:     General: Bowel sounds are normal. There is no distension.     Palpations: Abdomen is soft. There is no hepatomegaly, splenomegaly or mass.     Tenderness: There is no abdominal tenderness.  Musculoskeletal:        General: Normal range of motion.     Cervical back: Normal range of motion and neck supple.     Right  lower leg: No edema.     Left lower leg: No edema.  Lymphadenopathy:     Cervical: No cervical adenopathy.  Skin:    General: Skin is warm and dry.  Neurological:     General: No focal deficit present.     Mental Status: She is alert and oriented to person, place, and time. Mental status is at baseline.  Psychiatric:        Mood and Affect: Mood normal.        Behavior: Behavior normal.        Thought Content: Thought content normal.        Judgment: Judgment normal.   LABS:   CBC Latest Ref Rng & Units 02/18/2021 11/18/2020 07/30/2020  WBC - 6.8 6.6 6.2  Hemoglobin 12.0 - 16.0 12.8 12.2 13.5  Hematocrit 36 - 46 40 37 41  Platelets 150 - 399 265 262 249   CMP Latest Ref Rng & Units 02/18/2021 11/18/2020 07/30/2020  Glucose 70 - 99 mg/dL - - -  BUN 4 - _0 Creatinine 0.5 - 1.1 1.4(A) 1.0 0.9  Sodium 137 - 147 141 138 141  Potassium 3.4 - 5.3 4.0 4.1 3.9  Chloride 99 - 108 109(A) 107 106  CO2 13 - 22 26(A) 23(A) 24(A)  Calcium 8.7 - 10.7 9.4 9.6 9.6  Total Protein 6.5 - 8.1 g/dL - - -  Total Bilirubin 0.3 - 1.2 mg/dL - - -  Alkaline Phos 25 - 125 81 82 73  AST 13 - 35 _1 ALT 7 - 35 _2 STUDIES:  No results found.    HISTORY:   Allergies:  Allergies  Allergen Reactions   Sulfa Antibiotics Hives    Current Medications: Current Outpatient Medications  Medication Sig Dispense Refill   aspirin EC 81 MG tablet Take 81 mg by mouth daily. Swallow whole.     atorvastatin (LIPITOR) 20 MG tablet Take 1 tablet (20 mg total) by mouth daily. 90 tablet 3   B12-ACTIVE 1 MG CHEW Chew 1  tablet by mouth daily.     cetirizine (ZYRTEC) 10 MG tablet Take 10 mg by mouth daily.     famotidine (PEPCID) 20 MG tablet Take 20 mg by mouth 2 (two) times daily.     gabapentin (NEURONTIN) 600 MG tablet Take 600 mg by mouth daily.     hydrOXYzine (ATARAX/VISTARIL) 25 MG tablet Take 25 mg by mouth 4 (four) times daily as needed.     loratadine (CLARITIN) 10 MG tablet Take  10 mg by mouth daily.     methimazole (TAPAZOLE) 10 MG tablet Take by mouth.     nitroGLYCERIN (NITROSTAT) 0.4 MG SL tablet PLACE 1 TABLET UNDER THE TONGUE EVERY 5 (FIVE) MINUTES AS NEEDED. 75 tablet 2   ondansetron (ZOFRAN) 4 MG tablet Take 4 mg by mouth every 4 (four) hours as needed.     Oxycodone HCl 10 MG TABS Take 10 mg by mouth 4 (four) times daily as needed.     prochlorperazine (COMPAZINE) 10 MG tablet Take 10 mg by mouth every 6 (six) hours as needed.     propranolol (INDERAL) 40 MG tablet Take 40 mg by mouth daily.     sacubitril-valsartan (ENTRESTO) 24-26 MG Take 1 tablet by mouth 2 (two) times daily. (Patient taking differently: Take 1 tablet by mouth daily.) 60 tablet 3   traZODone (DESYREL) 50 MG tablet Take 50 mg by mouth at bedtime as needed.     TYLENOL 500 MG tablet Take 1,000 mg by mouth every 8 (eight) hours as needed.     venlafaxine XR (EFFEXOR-XR) 150 MG 24 hr capsule Take 150 mg by mouth daily.     Current Facility-Administered Medications  Medication Dose Route Frequency Provider Last Rate Last Admin   sodium chloride flush (NS) 0.9 % injection 10 mL  10 mL Intracatheter PRN Mosher, Kelli A, PA-C   10 mL at 02/18/21 1155   Facility-Administered Medications Ordered in Other Visits  Medication Dose Route Frequency Provider Last Rate Last Admin   sodium chloride flush (NS) 0.9 % injection 10 mL  10 mL Intracatheter PRN Mosher, Vida Roller A, PA-C   10 mL at 11/18/20 1123    I, Rita Ohara, am acting as scribe for Derwood Kaplan, MD  I have reviewed this report as typed by the medical scribe, and it is complete and accurate.

## 2021-02-18 ENCOUNTER — Inpatient Hospital Stay: Payer: Medicaid Other | Attending: Oncology | Admitting: Oncology

## 2021-02-18 ENCOUNTER — Encounter: Payer: Self-pay | Admitting: Oncology

## 2021-02-18 ENCOUNTER — Inpatient Hospital Stay: Payer: Medicaid Other

## 2021-02-18 ENCOUNTER — Other Ambulatory Visit: Payer: Self-pay

## 2021-02-18 ENCOUNTER — Other Ambulatory Visit: Payer: Self-pay | Admitting: Oncology

## 2021-02-18 ENCOUNTER — Other Ambulatory Visit: Payer: Self-pay | Admitting: Hematology and Oncology

## 2021-02-18 VITALS — BP 130/79 | HR 76 | Temp 97.7°F | Resp 18 | Ht 67.0 in | Wt 236.3 lb

## 2021-02-18 DIAGNOSIS — Z171 Estrogen receptor negative status [ER-]: Secondary | ICD-10-CM

## 2021-02-18 DIAGNOSIS — C773 Secondary and unspecified malignant neoplasm of axilla and upper limb lymph nodes: Secondary | ICD-10-CM | POA: Diagnosis not present

## 2021-02-18 DIAGNOSIS — C50112 Malignant neoplasm of central portion of left female breast: Secondary | ICD-10-CM

## 2021-02-18 DIAGNOSIS — C50412 Malignant neoplasm of upper-outer quadrant of left female breast: Secondary | ICD-10-CM | POA: Insufficient documentation

## 2021-02-18 DIAGNOSIS — C50012 Malignant neoplasm of nipple and areola, left female breast: Secondary | ICD-10-CM

## 2021-02-18 DIAGNOSIS — Z170 Estrogen receptor positive status [ER+]: Secondary | ICD-10-CM | POA: Insufficient documentation

## 2021-02-18 LAB — BASIC METABOLIC PANEL
BUN: 12 (ref 4–21)
CO2: 26 — AB (ref 13–22)
Chloride: 109 — AB (ref 99–108)
Creatinine: 1.4 — AB (ref 0.5–1.1)
Glucose: 97
Potassium: 4 (ref 3.4–5.3)
Sodium: 141 (ref 137–147)

## 2021-02-18 LAB — HEPATIC FUNCTION PANEL
ALT: 18 (ref 7–35)
AST: 25 (ref 13–35)
Alkaline Phosphatase: 81 (ref 25–125)
Bilirubin, Total: 0.7

## 2021-02-18 LAB — CBC AND DIFFERENTIAL
HCT: 40 (ref 36–46)
Hemoglobin: 12.8 (ref 12.0–16.0)
Neutrophils Absolute: 4.22
Platelets: 265 (ref 150–399)
WBC: 6.8

## 2021-02-18 LAB — COMPREHENSIVE METABOLIC PANEL
Albumin: 4.2 (ref 3.5–5.0)
Calcium: 9.4 (ref 8.7–10.7)

## 2021-02-18 LAB — CBC: RBC: 4.59 (ref 3.87–5.11)

## 2021-02-18 MED ORDER — HEPARIN SOD (PORK) LOCK FLUSH 100 UNIT/ML IV SOLN
500.0000 [IU] | Freq: Once | INTRAVENOUS | Status: AC | PRN
  Administered 2021-02-18: 500 [IU]

## 2021-02-18 MED ORDER — SODIUM CHLORIDE 0.9% FLUSH
10.0000 mL | INTRAVENOUS | Status: DC | PRN
  Administered 2021-02-18: 10 mL

## 2021-02-23 ENCOUNTER — Encounter: Payer: Self-pay | Admitting: Hematology and Oncology

## 2021-07-20 ENCOUNTER — Other Ambulatory Visit: Payer: Medicaid Other

## 2021-07-20 ENCOUNTER — Ambulatory Visit: Payer: Medicaid Other | Admitting: Oncology

## 2021-07-21 ENCOUNTER — Encounter: Payer: Self-pay | Admitting: Oncology

## 2021-07-27 ENCOUNTER — Encounter: Payer: Self-pay | Admitting: Oncology

## 2021-07-27 ENCOUNTER — Other Ambulatory Visit: Payer: Self-pay

## 2021-07-27 ENCOUNTER — Telehealth: Payer: Self-pay | Admitting: Oncology

## 2021-07-27 ENCOUNTER — Inpatient Hospital Stay: Payer: Medicaid Other | Attending: Oncology

## 2021-07-27 ENCOUNTER — Other Ambulatory Visit: Payer: Self-pay | Admitting: Oncology

## 2021-07-27 ENCOUNTER — Inpatient Hospital Stay (INDEPENDENT_AMBULATORY_CARE_PROVIDER_SITE_OTHER): Payer: Medicaid Other | Admitting: Oncology

## 2021-07-27 VITALS — BP 137/71 | HR 68 | Temp 97.9°F | Resp 18 | Ht 67.0 in | Wt 243.1 lb

## 2021-07-27 DIAGNOSIS — I420 Dilated cardiomyopathy: Secondary | ICD-10-CM | POA: Diagnosis not present

## 2021-07-27 DIAGNOSIS — C50112 Malignant neoplasm of central portion of left female breast: Secondary | ICD-10-CM | POA: Diagnosis not present

## 2021-07-27 DIAGNOSIS — Z171 Estrogen receptor negative status [ER-]: Secondary | ICD-10-CM | POA: Diagnosis not present

## 2021-07-27 DIAGNOSIS — C50912 Malignant neoplasm of unspecified site of left female breast: Secondary | ICD-10-CM | POA: Diagnosis present

## 2021-07-27 DIAGNOSIS — Z452 Encounter for adjustment and management of vascular access device: Secondary | ICD-10-CM | POA: Diagnosis present

## 2021-07-27 DIAGNOSIS — C50012 Malignant neoplasm of nipple and areola, left female breast: Secondary | ICD-10-CM

## 2021-07-27 DIAGNOSIS — Z170 Estrogen receptor positive status [ER+]: Secondary | ICD-10-CM | POA: Diagnosis not present

## 2021-07-27 LAB — BASIC METABOLIC PANEL
BUN: 14 (ref 4–21)
CO2: 21 (ref 13–22)
Chloride: 107 (ref 99–108)
Creatinine: 1 (ref 0.5–1.1)
Glucose: 100
Potassium: 3.7 mEq/L (ref 3.5–5.1)
Sodium: 140 (ref 137–147)

## 2021-07-27 LAB — CBC AND DIFFERENTIAL
HCT: 39 (ref 36–46)
Hemoglobin: 12.8 (ref 12.0–16.0)
Neutrophils Absolute: 5.02
Platelets: 251 10*3/uL (ref 150–400)
WBC: 7.6

## 2021-07-27 LAB — HEPATIC FUNCTION PANEL
ALT: 21 U/L (ref 7–35)
AST: 23 (ref 13–35)
Alkaline Phosphatase: 73 (ref 25–125)
Bilirubin, Total: 0.7

## 2021-07-27 LAB — COMPREHENSIVE METABOLIC PANEL
Albumin: 4.3 (ref 3.5–5.0)
Calcium: 9.3 (ref 8.7–10.7)

## 2021-07-27 LAB — CBC: RBC: 4.58 (ref 3.87–5.11)

## 2021-07-27 MED ORDER — SODIUM CHLORIDE 0.9% FLUSH
10.0000 mL | INTRAVENOUS | Status: DC | PRN
  Administered 2021-07-27: 10 mL

## 2021-07-27 MED ORDER — HEPARIN SOD (PORK) LOCK FLUSH 100 UNIT/ML IV SOLN
500.0000 [IU] | Freq: Once | INTRAVENOUS | Status: AC | PRN
  Administered 2021-07-27: 500 [IU]

## 2021-07-27 NOTE — Telephone Encounter (Signed)
Per 07/27/21 los next appt scheduled and confirmed with patient 

## 2021-07-27 NOTE — Progress Notes (Signed)
[facility]  [address] [place],  [place]  [address] [phone]  Clinic Day: 07/27/21  Referring physician: No ref. provider found  ASSESSMENT & PLAN:   Assessment & Plan: 1. 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  No primary breast lesion was found.  She was treated with neoadjuvant docetaxel/carboplatin/trastuzumab, left axillary lymph node dissection and adjuvant radiation therapy to the left breast and axilla. She completed 1 year of maintenance trastuzumab in June 2022. She remains without evidence of malignancy.   2. Cardiomyopathy with decreased left ventricular ejection fraction.  This is being managed by [name].  [name] from September revealed an ejection fraction of 39%, which was stable. Echocardiogram in November revealed an ejection fraction between 60-65%, which was a puzzle, as it has never been normal. Echocardiogram in March revealed an ejection fraction of 45-50%, which is closer to her baseline. She remains without symptoms of heart failure. [name] from April 2022 revealed stable left ventricular function, with an ejection fraction of 41%.   3. Bilateral hip and back pain, which is chronic.  MRI lumbar spine and left hip did not reveal any evidence of malignancy.  There were degenerative changes in the lumbar spine at L5-S1 several years ago. I am concerned about the possibility of spinal stenosis with her frequent falls. Unfortunately she will need to back off the NSAIDs due to her elevated creatinine. She is having significant back pain at this time and is being scheduled with an orthopedic surgeon.   We will flush her port today and she we will have this removed by [name]. I assured her that she can have that removed at any time since she has no evidence of disease.  We will plan to see her back in 6 months with CBC and CMP for repeat clinical assessment. The patient  understands the plans discussed today and is in agreement with them.  She knows to contact our office if she develops concerns prior to her next appointment.  I provided 15 minutes of face-to-face time during this this encounter and > 50% was spent counseling as documented under my assessment and plan.    Derwood Kaplan, MD  [facility] AT [facility] [address] [place] [address] Dept: [phone] Dept Fax: [phone]   Orders Placed This Encounter  Procedures   CBC and differential    This external order was created through the Results Console.   CBC    This external order was created through the Results Console.   Basic metabolic panel    This external order was created through the Results Console.   Comprehensive metabolic panel    This external order was created through the Results Console.   Hepatic function panel    This external order was created through the Results Console.    CHIEF COMPLAINT:  CC: Stage IIA HER2 positive breast cancer  Current Treatment:  Observation   HISTORY OF PRESENT ILLNESS:  Stage IIA (T0 N1 M0) HER2 receptor positive left breast cancer diagnosed in April 2021.  The patient felt a mass in the left axilla in March and reported this to her primary care provider.  Her last bilateral screening mammogram had been in November 2019 with a diagnostic left mammogram and ultrasound in December which revealed probable inframammary lymph node over the upper outer quadrant.  Six-month follow-up was recommended. Ultrasound in  March 2021 revealed multiple hypoechoic lesions of the left axilla with the largest up to 4.6 cm in diameter with another measuring 3.3 cm and another 2.6 cm.  The changes were felt possibly represent lymphadenitis.  Bilateral diagnostic mammogram at [facility] March revealed at least 7 enlarged intramammary and axillary lymph nodes.  No primary breast lesion was found.   Targeted left breast ultrasound revealed an increase in the previously demonstrated 7 mm intramammary node at 1:30 o'clock measured 13 mm with diffuse cortical thickening.  There were multiple enlarged left axillary nodes with eccentric cortical thickening with a maximum thickness of 19.8 mm.  Ultrasound-guided biopsy of the left axillary lymph node in April revealed metastatic carcinoma consistent with breast primary.  GATA 3 was positive with negative thyroglobulin, negative Napsin A and TTF1, and the PAX8 was equivocal.  Estrogen and progesterone receptors were negative and HER 2 positive.  Ki 67 was 40%.  Staging CT chest, abdomen and pelvis revealed bulky left axillary lymphadenopathy extending to the sub pectoralis nodal station with a 6 mm right upper paratracheal lymph node.  There was no evidence of metastatic disease in the lungs, abdomen or pelvis.  Breast MRI revealed a 1 x 1.2 oval enhancing mass within the posterior upper outer left breast felt to be an intraparenchymal lymph node, no breast masses were seen.  There was re-demonstration of the left axillary adenopathy.  On physical examination the left axillary adenopathy measured approximately 9 x 7 cm.  She had uterine ablation in 2011 for uterine fibroids, then experienced 1 day of uterine bleeding in 2012, but denies further bleeding.    We recommended neoadjuvant chemotherapy to include TCHP x 6 cycles with trastuzumab and pertuzumab for one full year.  Unfortunately, echocardiogram  In May revealed ejection fraction between 40 and 45%.  This was confirmed with [name] on May 10th, which revealed an ejection fraction of 42%.  Based on these findings, we changed the treatment regimen to docetaxel/carboplatin/trastuzumab [facility]) and eliminated pertuzumab, due to the potential cardiotoxicities from HER2 targeted therapies.  We also recommended repeat cardiac imaging every 6 weeks and instead of the standard every 12 weeks.  She was referred to Dr.  Geraldo Pitter and he started her on Entresto and has been monitoring her cardiac function.  She reported some chest tightness to him, so he recommended a [name].  She had quit smoking.  She received her 1st cycle of docetaxel, carboplatin and trastuzumab in May.  She experienced toxicities of decreased appetite, minimal nausea and vomiting, and generalized arthralgias.  Her biggest complaint was generalized achiness and hip pain, felt to be most likely due to [name].  Her pain was not controlled with hydrocodone/APAP 5/325 or 10/325, so she was given oxycodone 5 mg 1-2 tablets every 6 hours as needed for pain.  She was placed on esomeprazole 40 mg daily for acid reflux. She quit smoking in April 2021.   She continued Entresto daily.  She underwent nuclear stress test at [name] office in June, which revealed a fairly stable ejection fraction of 39%.  She was also recommended for CT coronary angiography, which is to be scheduled at [facility].  She underwent a coronary/cardiac CT in July revealed moderate CAD in proximal LAD and mild CAD in mid left main coronary artery, CADRADS = 3, coronary calcium score is 168, which places the patient in the 55 percentile for age and dex matched control, normal coronary origin with right dominance and mildly dilated main pulmonary  artery, 30 mm. [name] from August revealed a left ventricular ejection fraction of 39%, previously 42% in May, which was discussed with [name].  We discussed the risks and benefits with the patient again.  Overall, we feel that the benefit outweighs any further risk, as long as she is closely monitored.  Bilateral breast MRI in September revealed a decrease in the left axillary lymphadenopathy.  However, there was a persistent, but smaller circumscribed oval mass in the upper outer quadrant of the left breast.  We discussed this with [name] of breast radiology and we suspect this could represent the breast primary, but  biopsy was negative.  [name] imaging in September revealed a left ventricular fraction is 39%, which was stable.    Ultrasound guided left breast core needle biopsy in September revealed lymphoid tissue with reactive lymphoid hyperplasia and was negative for granulomas or malignancy.  No metastatic breast carcinoma could be seen.  She then underwent left axillary lymph node dissection on October 14th with [name]. Surgical pathology from this procedure found tumor present in 3 of 27 lymph nodes (3/27).  One lymph node was positive for macrometastasis measuring 62m.  Two lymph nodes had micrometastasis.  There were no lymph nodes with isolated tumor cells and no extranodal tumor seen. Echocardiogram in November revealed a normal ejection fraction between 60-65% , which was a puzzle, as very different from her baseline.  MRI lumbar spine and left hip revealed mild-to-moderate facet arthropathy at L5-S1, which was worse on the right.  MRI left hip was negative.  She completed 1 year of trastuzumab in June 2022.  Echocardiogram in March revealed an ejection fraction of 45-50%, which although decreased from November, was closer to her baseline. We decided to continue with trastuzumab. Our pharmacist recommended a [name] in 6 weeks instead of echo. [name] scan on April 25th revealed a slight increase in the ejection fraction at 41%.  Oncology History  Malignant neoplasm of left breast ([facility]  06/01/2019 Cancer Staging   Staging form: Breast, AJCC 8th Edition - Clinical stage from 06/01/2019: cT0, cN1(f), cM0, ER-, PR-, HER2+ - Signed by MDerwood Kaplan MD on 12/31/2019   09/05/2019 Initial Diagnosis   Malignant [place] of left breast ([facility]   11/22/2019 Cancer Staging   Staging form: Breast, AJCC 8th Edition - Pathologic stage from 11/22/2019: No Stage Recommended (ypT0, pN1, cM0, ER-, PR-, HER2+) - Signed by MDerwood Kaplan MD on 12/31/2019 Prognostic indicators: 3/27 nodes pos., no primary found    07/16/2020 Imaging   Bilateral diagnostic mammogram:  FINDINGS:  Surgical changes are seen in the left axilla. There are changes of  radiation therapy in the left breast. No suspicious mass or  malignant type microcalcifications identified in either breast.  IMPRESSION:  No evidence of malignancy in either breast.  RECOMMENDATION:  Bilateral diagnostic mammogram in 1 year is recommended.    Malignant [facility] of left female breast ([facility]  06/22/2019 - 08/01/2020 Chemotherapy   Patient is on Treatment Plan : BREAST  Trastuzumab Maintenance q21d     11/23/2019 Initial Diagnosis   [facility] [place] [facility]   07/16/2020 Imaging   Bilateral diagnostic mammogram:  FINDINGS:  Surgical changes are seen in the left axilla. There are changes of  radiation therapy in the left breast. No suspicious mass or  malignant type microcalcifications identified in either breast.  IMPRESSION:  No evidence of malignancy in either breast.  RECOMMENDATION:  Bilateral diagnostic mammogram in 1 year  is recommended.       INTERVAL HISTORY:  Sharnell is here for routine follow up and is doing fairly well.  She has a right sided tremor and sees a neurologist for this.  She is planning to get her port out through [name] soon.  Bilateral diagnostic mammogram is clear.  Blood counts and chemistries are unremarkable, and her creatinine has normalized.  Her  appetite is good, and she has gained 7 pounds since her last visit 6 months ago.  She denies fever, chills or other signs of infection.  She denies nausea, vomiting, bowel issues, or abdominal pain.  She denies sore throat, cough, dyspnea, or chest pain.  REVIEW OF SYSTEMS:  Review of Systems  Constitutional:  Negative for appetite change, chills, fatigue and fever.  HENT:  Negative.    Eyes: Negative.   Respiratory: Negative.  Negative for chest tightness, cough, hemoptysis, shortness of breath and wheezing.   Cardiovascular: Negative.   Negative for chest pain, leg swelling and palpitations.  Gastrointestinal: Negative.  Negative for abdominal distention, abdominal pain, blood in stool, constipation, diarrhea, nausea and vomiting.  Endocrine: Negative.   Genitourinary: Negative.  Negative for difficulty urinating, dysuria, frequency and hematuria.   Musculoskeletal:  Negative for arthralgias, flank pain and myalgias. Back pain: chronic, severe. Gait problem: resulting in multiple falls. Skin: Negative.   Neurological:  Negative for dizziness, extremity weakness, headaches, light-headedness, numbness, seizures and speech difficulty. Gait problem: resulting in multiple falls. Hematological: Negative.   Psychiatric/Behavioral: Negative.  Negative for depression and sleep disturbance. The patient is not nervous/anxious.      VITALS:  Blood pressure 137/71, pulse 68, temperature 97.9 F (36.6 C), temperature source Oral, resp. rate 18, height _0  (1.702 m), weight 243 lb 1.6 oz (110.3 kg), SpO2 100 %.  Wt Readings from Last 3 Encounters:  08/21/21 244 lb 9.6 oz (110.9 kg)  07/27/21 243 lb 1.6 oz (110.3 kg)  02/18/21 236 lb 4.8 oz (107.2 kg)    Body mass index is 38.07 kg/m.  Performance status (ECOG): 1 - Symptomatic but completely ambulatory  PHYSICAL EXAM:  Physical Exam Constitutional:      General: She is not in acute distress.    Appearance: Normal appearance. She is normal weight.  HENT:     Head: Normocephalic and atraumatic.  Eyes:     General: No scleral icterus.    Extraocular Movements: Extraocular movements intact.     Conjunctiva/sclera: Conjunctivae normal.     Pupils: Pupils are equal, round, and reactive to light.  Cardiovascular:     Rate and Rhythm: Normal rate and regular rhythm.     Pulses: Normal pulses.     Heart sounds: Normal heart sounds. No murmur heard.    No friction rub. No gallop.  Pulmonary:     Effort: Pulmonary effort is normal. No respiratory distress.     Breath sounds:  Normal breath sounds.  Chest:     Comments: Radiation changes and hyperpigmentation of the left breast. No masses in either breast. Deep well healed scar in the left axilla. Abdominal:     General: Bowel sounds are normal. There is no distension.     Palpations: Abdomen is soft. There is no hepatomegaly, splenomegaly or mass.     Tenderness: There is no abdominal tenderness.  Musculoskeletal:        General: Normal range of motion.     Cervical back: Normal range of motion and neck supple.     Right  lower leg: No edema.     Left lower leg: No edema.  Lymphadenopathy:     Cervical: No cervical adenopathy.  Skin:    General: Skin is warm and dry.  Neurological:     General: No focal deficit present.     Mental Status: She is alert and oriented to person, place, and time. Mental status is at baseline.  Psychiatric:        Mood and Affect: Mood normal.        Behavior: Behavior normal.        Thought Content: Thought content normal.        Judgment: Judgment normal.   LABS:      Latest Ref Rng & Units 08/21/2021    4:52 PM 07/27/2021   12:00 AM 02/18/2021   12:00 AM  CBC  WBC 3.4 - 10.8 x10E3/uL 7.6  7.6     6.8      Hemoglobin 11.1 - 15.9 g/dL 12.9  12.8     12.8      Hematocrit 34.0 - 46.6 % 37.5  39     40      Platelets 150 - 450 x10E3/uL 305  251     265         This result is from an external source.      Latest Ref Rng & Units 08/21/2021    4:52 PM 07/27/2021   12:00 AM 02/18/2021   12:00 AM  CMP  Glucose 70 - 99 mg/dL 97     BUN 6 - 24 mg/dL _0 Creatinine 0.57 - 1.00 mg/dL 1.03  1.0     1.4      Sodium 134 - 144 mmol/L 142  140     141      Potassium 3.5 - 5.2 mmol/L 4.2  3.7     4.0      Chloride 96 - 106 mmol/L 106  107     109      CO2 20 - 29 mmol/L _1 Calcium 8.7 - 10.2 mg/dL 9.8  9.3     9.4      Alkaline Phos 25 - 125  73     81      AST 13 - 35  23     25      ALT 7 - 35 U/L  21     18         This result is from an  external source.    STUDIES:  Bilateral diagnostic mammogram was performed on July 20, 2021 and does not reveal any concerning abnormalities.  It was recommended that she could never go to screening mammograms.  Her breasts are heterogeneously dense.  She will need a repeat in 1 year.   HISTORY:   Allergies:  Allergies  Allergen Reactions   Sulfa Antibiotics Hives    Current Medications: Current Outpatient Medications  Medication Sig Dispense Refill   DULoxetine (CYMBALTA) 30 MG capsule Take 30 mg by mouth in the morning.     acetaminophen (TYLENOL) 500 MG tablet Take 500 mg by mouth every 6 (six) hours as needed (pain.).     Artificial Tear Solution (GENTEAL TEARS) 0.1-0.2-0.3 % SOLN Place 1 drop into both eyes at bedtime.     aspirin EC 81 MG tablet Take 81 mg by mouth daily.  Swallow whole.     atorvastatin (LIPITOR) 20 MG tablet Take 20 mg by mouth in the morning.     cetirizine (ZYRTEC) 10 MG tablet Take 10 mg by mouth daily as needed for allergies.     clonazePAM (KLONOPIN) 1 MG tablet Take 1 mg by mouth 2 (two) times daily.     cyanocobalamin (,VITAMIN B-12,) 1000 MCG/ML injection Inject 1,000 mcg into the muscle every 30 (thirty) days.     doxycycline (VIBRAMYCIN) 100 MG capsule Take 100 mg by mouth 2 (two) times daily.     famotidine (PEPCID) 40 MG tablet Take 40 mg by mouth in the morning.     folic acid (FOLVITE) 1 MG tablet Take 1 mg by mouth in the morning.     gabapentin (NEURONTIN) 600 MG tablet Take 600 mg by mouth 2 (two) times daily.     hydrOXYzine (ATARAX/VISTARIL) 25 MG tablet Take 25 mg by mouth 4 (four) times daily as needed for itching or rash.     levothyroxine (SYNTHROID) 50 MCG tablet Take 50 mcg by mouth at bedtime.     loratadine (CLARITIN) 10 MG tablet Take 10 mg by mouth daily as needed for allergies.     nitroGLYCERIN (NITROSTAT) 0.4 MG SL tablet Place 0.4 mg under the tongue every 5 (five) minutes as needed for chest pain.     ondansetron (ZOFRAN) 4  MG tablet Take 4 mg by mouth every 4 (four) hours as needed for nausea/vomiting.     Oxycodone HCl 10 MG TABS Take 10 mg by mouth in the morning, at noon, in the evening, and at bedtime.     propranolol (INDERAL) 40 MG tablet Take 40 mg by mouth in the morning.     sacubitril-valsartan (ENTRESTO) 24-26 MG Take 1 tablet by mouth 2 (two) times daily. (Patient not taking: Reported on 08/21/2021) 60 tablet 3   traZODone (DESYREL) 50 MG tablet Take 50 mg by mouth at bedtime.     venlafaxine XR (EFFEXOR-XR) 150 MG 24 hr capsule Take 150 mg by mouth in the morning.     Vitamin D, Ergocalciferol, (DRISDOL) 1.25 MG (50000 UNIT) CAPS capsule Take 50,000 Units by mouth every Monday.     Current Facility-Administered Medications  Medication Dose Route Frequency Provider Last Rate Last Admin   sodium chloride flush (NS) 0.9 % injection 10 mL  10 mL Intracatheter PRN Mosher, Kelli A, PA-C   10 mL at 07/27/21 1200   Facility-Administered Medications Ordered in Other Visits  Medication Dose Route Frequency Provider Last Rate Last Admin   sodium chloride flush (NS) 0.9 % injection 10 mL  10 mL Intracatheter PRN Mosher, Vida Roller A, PA-C   10 mL at 11/18/20 1123    I, Rita Ohara, am acting as scribe for Derwood Kaplan, MD  I have reviewed this report as typed by the medical scribe, and it is complete and accurate.

## 2021-08-20 ENCOUNTER — Other Ambulatory Visit: Payer: Self-pay

## 2021-08-21 ENCOUNTER — Encounter: Payer: Self-pay | Admitting: Cardiology

## 2021-08-21 ENCOUNTER — Ambulatory Visit (INDEPENDENT_AMBULATORY_CARE_PROVIDER_SITE_OTHER): Payer: Medicaid Other | Admitting: Cardiology

## 2021-08-21 VITALS — BP 125/76 | HR 76 | Ht 63.0 in | Wt 244.6 lb

## 2021-08-21 DIAGNOSIS — I209 Angina pectoris, unspecified: Secondary | ICD-10-CM

## 2021-08-21 DIAGNOSIS — I2510 Atherosclerotic heart disease of native coronary artery without angina pectoris: Secondary | ICD-10-CM | POA: Diagnosis not present

## 2021-08-21 DIAGNOSIS — R079 Chest pain, unspecified: Secondary | ICD-10-CM | POA: Diagnosis not present

## 2021-08-21 DIAGNOSIS — I429 Cardiomyopathy, unspecified: Secondary | ICD-10-CM

## 2021-08-21 HISTORY — DX: Angina pectoris, unspecified: I20.9

## 2021-08-21 NOTE — Progress Notes (Signed)
I am done Cardiology Office Note:    Date:  08/21/2021   ID:  Brandi Weaver, DOB 1966/01/08, MRN [id number]  PCP:  Pcp, No  Cardiologist:  Jenean Lindau, MD   Referring MD: No ref. provider found    ASSESSMENT:    1. Coronary artery disease involving native coronary artery of native heart without angina pectoris   2. Angina pectoris (HCC)   3. Cardiomyopathy, unspecified type ([place])   4. Chest pain on exertion   5. Morbid (severe) obesity due to excess calories (HCC)    PLAN:    In order of problems listed above:  Coronary artery disease and angina pectoris: Secondary prevention stressed with the patient.  Importance of compliance with diet medication stressed and she vocalized understanding.In view of the patient's symptoms, I discussed with the patient options for evaluation. Invasive and noninvasive options were given to the patient. I discussed stress testing and coronary angiography and left heart catheterization at length. Benefits, pros and cons of each approach were discussed at length. Patient had multiple questions which were answered to the patient's satisfaction. Patient opted for invasive evaluation and we will set up for coronary angiography and left heart catheterization. Further recommendations will be made based on the findings with coronary angiography. In the interim if the patient has any significant symptoms in hospital to the nearest emergency room. Essential hypertension: Blood pressure stable and diet was emphasized.  Lifestyle modification urged. Cardiomyopathy: The coronary angiography and left ventriculography will help [place] assess her left ventricular systolic function.  She might.  Echocardiogram in addition for this evaluation.  Currently she has not taken Entresto for 2 weeks as she did not refill her medications.  Her blood pressure is borderline and at this point I will not start that medication keeping in mind that she will need angiography in the next  few days.  I discussed this with her she vocalized understanding. Mixed dyslipidemia: On statin therapy diet emphasized. Obesity: Weight reduction stressed I cautioned her against a sedentary lifestyle.  I told her to be active once we do the aforementioned tests at that point we will give her a road map about exercise and such activities.  She understands.   Medication Adjustments/Labs and Tests Ordered: Current medicines are reviewed at length with the patient today.  Concerns regarding medicines are outlined above.  No orders of the defined types were placed in this encounter.  No orders of the defined types were placed in this encounter.    No chief complaint on file.    History of Present Illness:    Brandi Weaver is a 56 y.o. female.  Patient has past medical history of coronary artery disease, essential hypertension cardiomyopathy and mixed dyslipidemia.  Overall she leads a sedentary lifestyle.  She is morbidly obese.  She now mentions to me that she has substernal chest tightness on minimal exertion.  She uses nitroglycerin with relief.  This is concerning to her.  At the time of my evaluation, the patient is alert awake oriented and in no distress.  Past Medical History:  Diagnosis Date   CAD (coronary artery disease) 09/06/2019   Cardiomyopathy ([place]) 06/26/2019   Chest pain on exertion 10/18/2016   Depressed left ventricular ejection fraction 08/05/2020   Hypertension 10/18/2016   Malignant neoplasm of left breast [facility])    Malignant neoplasm of left female breast ([place]) 11/23/2019   Morbid (severe) obesity due to excess calories ([place]) 06/25/2019   Formatting of this note  might be different from the original. 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 Added automatically from request for surgery [id number]   Tobacco use disorder 10/19/2016    Past Surgical History:  Procedure Laterality  Date   ABLATION     TUBAL LIGATION      Current Medications: Current Meds  Medication Sig   aspirin EC 81 MG tablet Take 81 mg by mouth daily. Swallow whole.   atorvastatin (LIPITOR) 20 MG tablet Take 20 mg by mouth daily.   cetirizine (ZYRTEC) 10 MG tablet Take 10 mg by mouth daily.   clonazePAM (KLONOPIN) 1 MG tablet Take 1 mg by mouth 2 (two) times daily.   cyanocobalamin (,VITAMIN B-12,) 1000 MCG/ML injection Inject 1,000 mcg into the muscle every 30 (thirty) days.   DULoxetine (CYMBALTA) 30 MG capsule Take 1 capsule by mouth daily.   famotidine (PEPCID) 40 MG tablet Take 40 mg by mouth daily.   folic acid (FOLVITE) 1 MG tablet Take 1 mg by mouth daily.   gabapentin (NEURONTIN) 600 MG tablet Take 600 mg by mouth daily.   hydrOXYzine (ATARAX/VISTARIL) 25 MG tablet Take 25 mg by mouth 4 (four) times daily as needed for itching or rash.   levothyroxine (SYNTHROID) 50 MCG tablet Take 50 mcg by mouth daily.   loratadine (CLARITIN) 10 MG tablet Take 10 mg by mouth daily.   nitroGLYCERIN (NITROSTAT) 0.4 MG SL tablet Place 0.4 mg under the tongue every 5 (five) minutes as needed for chest pain.   ondansetron (ZOFRAN) 4 MG tablet Take 4 mg by mouth every 4 (four) hours as needed for nausea/vomiting.   Oxycodone HCl 10 MG TABS Take 10 mg by mouth 4 (four) times daily as needed for pain.   prochlorperazine (COMPAZINE) 10 MG tablet Take 10 mg by mouth every 6 (six) hours as needed for nausea/vomiting.   propranolol (INDERAL) 40 MG tablet Take 40 mg by mouth daily.   traZODone (DESYREL) 50 MG tablet Take 50 mg by mouth at bedtime as needed for depression or sleep.   TYLENOL 500 MG tablet Take 1,000 mg by mouth every 8 (eight) hours as needed for headache or pain.   venlafaxine XR (EFFEXOR-XR) 150 MG 24 hr capsule Take 150 mg by mouth daily.   Vitamin D, Ergocalciferol, (DRISDOL) 1.25 MG (50000 UNIT) CAPS capsule Take 50,000 Units by mouth once a week.     Allergies:   Sulfa antibiotics    Social History   Socioeconomic History   Marital status: Widowed    Spouse name: Not on file   Number of children: Not on file   Years of education: Not on file   Highest education level: Not on file  Occupational History   Not on file  Tobacco Use   Smoking status: Former    Types: Cigarettes    Quit date: 06/05/2019    Years since quitting: 2.2   Smokeless tobacco: Never  Vaping Use   Vaping Use: Never used  Substance and Sexual Activity   Alcohol use: Not Currently   Drug use: Never   Sexual activity: Not Currently  Other Topics Concern   Not on file  Social History Narrative   Not on file   Social Determinants of Health   Financial Resource Strain: Not on file  Food Insecurity: Not on file  Transportation Needs: Not on file  Physical Activity: Not on file  Stress: Not on file  Social Connections: Not on file     Family History: The patient's family history includes Heart disease in her father; Hypertension in her father; Stroke in her father; Throat cancer in her mother.  ROS:   Please see the history of present illness.    All other systems reviewed and are negative.  EKGs/Labs/Other Studies Reviewed:    The following studies were reviewed today: EKG reveals sinus rhythm and nonspecific ST-T changes   Recent Labs: 07/27/2021: ALT 21; BUN 14; Creatinine 1.0; Hemoglobin 12.8; Platelets 251; Potassium 3.7; Sodium 140  Recent Lipid Panel    Component Value Date/Time   CHOL 230 (H) 12/07/2019 1002   TRIG 205 (H) 12/07/2019 1002   HDL 41 12/07/2019 1002   CHOLHDL 5.6 (H) 12/07/2019 1002   LDLCALC 152 (H) 12/07/2019 1002    Physical Exam:    VS:  BP 125/76   Pulse 76   Ht '5\' 3"'$  (1.6 m)   Wt 244 lb 9.6 oz (110.9 kg)   SpO2 98%   BMI 43.33 kg/m     Wt Readings from Last 3 Encounters:  08/21/21 244 lb 9.6 oz (110.9 kg)  07/27/21 243 lb 1.6 oz (110.3 kg)  02/18/21 236 lb 4.8 oz (107.2 kg)     GEN: Patient is in no acute distress HEENT:  Normal NECK: No JVD; No carotid bruits LYMPHATICS: No lymphadenopathy CARDIAC: Hear sounds regular, 2/6 systolic murmur at the apex. RESPIRATORY:  Clear to auscultation without rales, wheezing or rhonchi  ABDOMEN: Soft, non-tender, non-distended MUSCULOSKELETAL:  No edema; No deformity  SKIN: Warm and dry NEUROLOGIC:  Alert and oriented x 3 PSYCHIATRIC:  Normal affect   Signed, Jenean Lindau, MD  08/21/2021 4:37 PM    [facility]

## 2021-08-21 NOTE — H&P (View-Only) (Signed)
I am done Cardiology Office Note:    Date:  08/21/2021   ID:  Brandi Weaver, DOB 06/20/1965, MRN [id number]  PCP:  Pcp, No  Cardiologist:  Jenean Lindau, MD   Referring MD: No ref. provider found    ASSESSMENT:    1. Coronary artery disease involving native coronary artery of native heart without angina pectoris   2. Angina pectoris (HCC)   3. Cardiomyopathy, unspecified type ([place])   4. Chest pain on exertion   5. Morbid (severe) obesity due to excess calories (HCC)    PLAN:    In order of problems listed above:  Coronary artery disease and angina pectoris: Secondary prevention stressed with the patient.  Importance of compliance with diet medication stressed and she vocalized understanding.In view of the patient's symptoms, I discussed with the patient options for evaluation. Invasive and noninvasive options were given to the patient. I discussed stress testing and coronary angiography and left heart catheterization at length. Benefits, pros and cons of each approach were discussed at length. Patient had multiple questions which were answered to the patient's satisfaction. Patient opted for invasive evaluation and we will set up for coronary angiography and left heart catheterization. Further recommendations will be made based on the findings with coronary angiography. In the interim if the patient has any significant symptoms in hospital to the nearest emergency room. Essential hypertension: Blood pressure stable and diet was emphasized.  Lifestyle modification urged. Cardiomyopathy: The coronary angiography and left ventriculography will help [place] assess her left ventricular systolic function.  She might.  Echocardiogram in addition for this evaluation.  Currently she has not taken Entresto for 2 weeks as she did not refill her medications.  Her blood pressure is borderline and at this point I will not start that medication keeping in mind that she will need angiography in the next  few days.  I discussed this with her she vocalized understanding. Mixed dyslipidemia: On statin therapy diet emphasized. Obesity: Weight reduction stressed I cautioned her against a sedentary lifestyle.  I told her to be active once we do the aforementioned tests at that point we will give her a road map about exercise and such activities.  She understands.   Medication Adjustments/Labs and Tests Ordered: Current medicines are reviewed at length with the patient today.  Concerns regarding medicines are outlined above.  No orders of the defined types were placed in this encounter.  No orders of the defined types were placed in this encounter.    No chief complaint on file.    History of Present Illness:    Brandi Weaver is a 56 y.o. female.  Patient has past medical history of coronary artery disease, essential hypertension cardiomyopathy and mixed dyslipidemia.  Overall she leads a sedentary lifestyle.  She is morbidly obese.  She now mentions to me that she has substernal chest tightness on minimal exertion.  She uses nitroglycerin with relief.  This is concerning to her.  At the time of my evaluation, the patient is alert awake oriented and in no distress.  Past Medical History:  Diagnosis Date   CAD (coronary artery disease) 09/06/2019   Cardiomyopathy ([place]) 06/26/2019   Chest pain on exertion 10/18/2016   Depressed left ventricular ejection fraction 08/05/2020   Hypertension 10/18/2016   Malignant neoplasm of left breast [facility])    Malignant neoplasm of left female breast ([place]) 11/23/2019   Morbid (severe) obesity due to excess calories ([place]) 06/25/2019   Formatting of this note  might be different from the original. 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 Added automatically from request for surgery [id number]   Tobacco use disorder 10/19/2016    Past Surgical History:  Procedure Laterality  Date   ABLATION     TUBAL LIGATION      Current Medications: Current Meds  Medication Sig   aspirin EC 81 MG tablet Take 81 mg by mouth daily. Swallow whole.   atorvastatin (LIPITOR) 20 MG tablet Take 20 mg by mouth daily.   cetirizine (ZYRTEC) 10 MG tablet Take 10 mg by mouth daily.   clonazePAM (KLONOPIN) 1 MG tablet Take 1 mg by mouth 2 (two) times daily.   cyanocobalamin (,VITAMIN B-12,) 1000 MCG/ML injection Inject 1,000 mcg into the muscle every 30 (thirty) days.   DULoxetine (CYMBALTA) 30 MG capsule Take 1 capsule by mouth daily.   famotidine (PEPCID) 40 MG tablet Take 40 mg by mouth daily.   folic acid (FOLVITE) 1 MG tablet Take 1 mg by mouth daily.   gabapentin (NEURONTIN) 600 MG tablet Take 600 mg by mouth daily.   hydrOXYzine (ATARAX/VISTARIL) 25 MG tablet Take 25 mg by mouth 4 (four) times daily as needed for itching or rash.   levothyroxine (SYNTHROID) 50 MCG tablet Take 50 mcg by mouth daily.   loratadine (CLARITIN) 10 MG tablet Take 10 mg by mouth daily.   nitroGLYCERIN (NITROSTAT) 0.4 MG SL tablet Place 0.4 mg under the tongue every 5 (five) minutes as needed for chest pain.   ondansetron (ZOFRAN) 4 MG tablet Take 4 mg by mouth every 4 (four) hours as needed for nausea/vomiting.   Oxycodone HCl 10 MG TABS Take 10 mg by mouth 4 (four) times daily as needed for pain.   prochlorperazine (COMPAZINE) 10 MG tablet Take 10 mg by mouth every 6 (six) hours as needed for nausea/vomiting.   propranolol (INDERAL) 40 MG tablet Take 40 mg by mouth daily.   traZODone (DESYREL) 50 MG tablet Take 50 mg by mouth at bedtime as needed for depression or sleep.   TYLENOL 500 MG tablet Take 1,000 mg by mouth every 8 (eight) hours as needed for headache or pain.   venlafaxine XR (EFFEXOR-XR) 150 MG 24 hr capsule Take 150 mg by mouth daily.   Vitamin D, Ergocalciferol, (DRISDOL) 1.25 MG (50000 UNIT) CAPS capsule Take 50,000 Units by mouth once a week.     Allergies:   Sulfa antibiotics    Social History   Socioeconomic History   Marital status: Widowed    Spouse name: Not on file   Number of children: Not on file   Years of education: Not on file   Highest education level: Not on file  Occupational History   Not on file  Tobacco Use   Smoking status: Former    Types: Cigarettes    Quit date: 06/05/2019    Years since quitting: 2.2   Smokeless tobacco: Never  Vaping Use   Vaping Use: Never used  Substance and Sexual Activity   Alcohol use: Not Currently   Drug use: Never   Sexual activity: Not Currently  Other Topics Concern   Not on file  Social History Narrative   Not on file   Social Determinants of Health   Financial Resource Strain: Not on file  Food Insecurity: Not on file  Transportation Needs: Not on file  Physical Activity: Not on file  Stress: Not on file  Social Connections: Not on file     Family History: The patient's family history includes Heart disease in her father; Hypertension in her father; Stroke in her father; Throat cancer in her mother.  ROS:   Please see the history of present illness.    All other systems reviewed and are negative.  EKGs/Labs/Other Studies Reviewed:    The following studies were reviewed today: EKG reveals sinus rhythm and nonspecific ST-T changes   Recent Labs: 07/27/2021: ALT 21; BUN 14; Creatinine 1.0; Hemoglobin 12.8; Platelets 251; Potassium 3.7; Sodium 140  Recent Lipid Panel    Component Value Date/Time   CHOL 230 (H) 12/07/2019 1002   TRIG 205 (H) 12/07/2019 1002   HDL 41 12/07/2019 1002   CHOLHDL 5.6 (H) 12/07/2019 1002   LDLCALC 152 (H) 12/07/2019 1002    Physical Exam:    VS:  BP 125/76   Pulse 76   Ht '5\' 3"'$  (1.6 m)   Wt 244 lb 9.6 oz (110.9 kg)   SpO2 98%   BMI 43.33 kg/m     Wt Readings from Last 3 Encounters:  08/21/21 244 lb 9.6 oz (110.9 kg)  07/27/21 243 lb 1.6 oz (110.3 kg)  02/18/21 236 lb 4.8 oz (107.2 kg)     GEN: Patient is in no acute distress HEENT:  Normal NECK: No JVD; No carotid bruits LYMPHATICS: No lymphadenopathy CARDIAC: Hear sounds regular, 2/6 systolic murmur at the apex. RESPIRATORY:  Clear to auscultation without rales, wheezing or rhonchi  ABDOMEN: Soft, non-tender, non-distended MUSCULOSKELETAL:  No edema; No deformity  SKIN: Warm and dry NEUROLOGIC:  Alert and oriented x 3 PSYCHIATRIC:  Normal affect   Signed, Jenean Lindau, MD  08/21/2021 4:37 PM     [facility]

## 2021-08-21 NOTE — Patient Instructions (Signed)
Medication Instructions:  Your physician has recommended you make the following change in your medication:   Take 81 mg coated aspirin daily. Use nitroglycerin as needed for chest pain.  *If you need a refill on your cardiac medications before your next appointment, please call your pharmacy*   Lab Work: Your physician recommends that you have a BMET and CBC today in the office for your upcoming procedure.  If you have labs (blood work) drawn today and your tests are completely normal, you will receive your results only by: [place] (if you have MyChart) OR A paper copy in the mail If you have any lab test that is abnormal or we need to change your treatment, we will call you to review the results.   Testing/Procedures:  [place] [id number] Dept: [phone] Loc: [place]  08/21/2021  You are scheduled for a Cardiac Catheterization on Thursday, July 20 with Dr. Larae Grooms.  1. Please arrive at the Main Entrance A at [facility]: [place], [place] [address] at 8:30 AM (This time is two hours before your procedure to ensure your preparation). Free valet parking service is available.   Special note: Every effort is made to have your procedure done on time. Please understand that emergencies sometimes delay scheduled procedures.  2. Diet: Do not eat solid foods after midnight.  You may have clear liquids until 5 AM upon the day of the procedure.  3. Labs: You had your labs done today in the office.  4. Medication instructions in preparation for your procedure:   Contrast Allergy: No  Stop taking, Entresto Thursday, July 20,  On the morning of your procedure, take Aspirin and any morning medicines NOT listed above.  You may use sips of water.  5. Plan to go home the same day, you will only stay overnight if  medically necessary. 6. You MUST have a responsible adult to drive you home. 7. An adult MUST be with you the first 24 hours after you arrive home. 8. Bring a current list of your medications, and the last time and date medication taken. 9. Bring ID and current insurance cards. 10.Please wear clothes that are easy to get on and off and wear slip-on shoes.  Thank you for allowing [place] to care for you!   -- [facility]    Follow-Up: At [facility], you and your health needs are our priority.  As part of our continuing mission to provide you with exceptional heart care, we have created designated Provider Care Teams.  These Care Teams include your primary Cardiologist (physician) and Advanced Practice Providers (APPs -  Physician Assistants and Nurse Practitioners) who all work together to provide you with the care you need, when you need it.  We recommend signing up for the patient portal called "MyChart".  Sign up information is provided on this After Visit Summary.  MyChart is used to connect with patients for Virtual Visits (Telemedicine).  Patients are able to view lab/test results, encounter notes, upcoming appointments, etc.  Non-urgent messages can be sent to your provider as well.   To learn more about what you can do with MyChart, go to [email or web address].    Your next appointment:   1 month(s)  The format for your next appointment:   In Person  Provider:   Jyl Heinz, MD   Other Instructions  Coronary Angiogram With Stent Coronary angiogram with stent placement is a procedure to widen or open a narrow blood vessel of the heart (coronary artery). Arteries may become blocked by cholesterol buildup (plaques) in the lining of the artery wall. When a coronary artery becomes partially blocked, blood flow to that area decreases. This may lead to chest pain or a heart attack (myocardial infarction). A stent is a small piece of metal that  looks like mesh or spring. Stent placement may be done as treatment after a heart attack, or to prevent a heart attack if a blocked artery is found by a coronary angiogram. Let your health care provider know about: Any allergies you have, including allergies to medicines or contrast dye. All medicines you are taking, including vitamins, herbs, eye drops, creams, and over-the-counter medicines. Any problems you or family members have had with anesthetic medicines. Any blood disorders you have. Any surgeries you have had. Any medical conditions you have, including kidney problems or kidney failure. Whether you are pregnant or may be pregnant. Whether you are breastfeeding. What are the risks? Generally, this is a safe procedure. However, serious problems may occur, including: Damage to nearby structures or organs, such as the heart, blood vessels, or kidneys. A return of blockage. Bleeding, infection, or bruising at the insertion site. A collection of blood under the skin (hematoma) at the insertion site. A blood clot in another part of the body. Allergic reaction to medicines or dyes. Bleeding into the abdomen (retroperitoneal bleeding). Stroke (rare). Heart attack (rare). What happens before the procedure? Staying hydrated Follow instructions from your health care provider about hydration, which may include: Up to 2 hours before the procedure - you may continue to drink clear liquids, such as water, clear fruit juice, black coffee, and plain tea.    Eating and drinking restrictions Follow instructions from your health care provider about eating and drinking, which may include: 8 hours before the procedure - stop eating heavy meals or foods, such as meat, fried foods, or fatty foods. 6 hours before the procedure - stop eating light meals or foods, such as toast or cereal. 2 hours before the procedure - stop drinking clear liquids. Medicines Ask your health care provider  about: Changing or stopping your regular medicines. This is especially important if you are taking diabetes medicines or blood thinners. Taking medicines such as aspirin and ibuprofen. These medicines can thin your blood. Do not take these medicines unless your health care provider tells you to take them. Generally, aspirin is recommended before a thin tube, called a catheter, is passed through a blood vessel and inserted into the heart (cardiac catheterization). Taking over-the-counter medicines, vitamins, herbs, and supplements. General instructions Do not use any products that contain nicotine or tobacco for at least 4 weeks before the procedure. These products include cigarettes, e-cigarettes, and chewing tobacco. If you need help quitting, ask your health care provider. Plan to have someone take you home from the hospital or clinic. If you will be going home right after the procedure, plan to have someone with you for 24 hours. You may have tests and imaging procedures. Ask your health care provider: How your insertion site will be marked. Ask which artery will be used for the procedure. What steps will be taken to help prevent infection. These may include: Removing hair at the insertion site. Washing skin with a germ-killing soap. Taking antibiotic medicine. What happens during the procedure? An IV will be inserted into one of your  veins. Electrodes may be placed on your chest to monitor your heart rate during the procedure. You will be given one or more of the following: A medicine to help you relax (sedative). A medicine to numb the area (local anesthetic) for catheter insertion. A small incision will be made for catheter insertion. The catheter will be inserted into an artery using a guide wire. The location may be in your groin, your wrist, or the fold of your arm (near your elbow). An X-ray procedure (fluoroscopy) will be used to help guide the catheter to the opening of the heart  arteries. A dye will be injected into the catheter. X-rays will be taken. The dye helps to show where any narrowing or blockages are located in the arteries. Tell your health care provider if you have chest pain or trouble breathing. A tiny wire will be guided to the blocked spot, and a balloon will be inflated to make the artery wider. The stent will be expanded to crush the plaques into the wall of the vessel. The stent will hold the area open and improve the blood flow. Most stents have a drug coating to reduce the risk of the stent narrowing over time. The artery may be made wider using a drill, laser, or other tools that remove plaques. The catheter will be removed when the blood flow improves. The stent will stay where it was placed, and the lining of the artery will grow over it. A bandage (dressing) will be placed on the insertion site. Pressure will be applied to stop bleeding. The IV will be removed. This procedure may vary among health care providers and hospitals.    What happens after the procedure? Your blood pressure, heart rate, breathing rate, and blood oxygen level will be monitored until you leave the hospital or clinic. If the procedure is done through the leg, you will lie flat in bed for a few hours or for as long as told by your health care provider. You will be instructed not to bend or cross your legs. The insertion site and the pulse in your foot or wrist will be checked often. You may have more blood tests, X-rays, and a test that records the electrical activity of your heart (electrocardiogram, or ECG). Do not drive for 24 hours if you were given a sedative during your procedure. Summary Coronary angiogram with stent placement is a procedure to widen or open a narrowed coronary artery. This is done to treat heart problems. Before the procedure, let your health care provider know about all the medical conditions and surgeries you have or have had. This is a safe  procedure. However, some problems may occur, including damage to nearby structures or organs, bleeding, blood clots, or allergies. Follow your health care provider's instructions about eating, drinking, medicines, and other lifestyle changes, such as quitting tobacco use before the procedure. This information is not intended to replace advice given to you by your health care provider. Make sure you discuss any questions you have with your health care provider. Document Revised: 08/16/2018 Document Reviewed: 08/16/2018 Elsevier Patient Education  2021 [place].  Aspirin and Your Heart Aspirin is a medicine that prevents the platelets in your blood from sticking together. Platelets are the cells that your blood uses for clotting. Aspirin can be used to help reduce the risk of blood clots, heart attacks, and other heart-related problems. What are the risks? Daily use of aspirin can cause side effects. Some of these include: Bleeding. Bleeding  can be minor or serious. An example of minor bleeding is bleeding from a cut, and the bleeding does not stop. An example of more serious bleeding is stomach bleeding or, rarely, bleeding into the brain. Your risk of bleeding increases if you are also taking NSAIDs, such as ibuprofen. Increased bruising. Upset stomach. An allergic reaction. People who have growths inside the nose (nasal polyps) have an increased risk of developing an aspirin allergy. How to use aspirin to care for your heart Take aspirin only as told by your health care provider. Make sure that you understand how much to take and what form to take. The two forms of aspirin are: Non-enteric-coated.This type of aspirin does not have a coating and is absorbed quickly. This type of aspirin also comes in a chewable form. Enteric-coated. This type of aspirin has a coating that releases the medicine very slowly. Enteric-coated aspirin might cause less stomach upset than non-enteric-coated aspirin.  This type of aspirin should not be chewed or crushed. Work with your health care provider to find out whether it is safe and beneficial for you to take aspirin daily. Taking aspirin daily may be helpful if: You have had a heart attack or chest pain, or you are at risk for a heart attack. You have a condition in which certain heart vessels are blocked (coronary artery disease), and you have had a procedure to treat it. Examples are: Open-heart surgery, such as coronary artery bypass surgery (CABG). Coronary angioplasty,which is done to widen a blood vessel of your heart. Having a small mesh tube, or stent, placed in your coronary artery. You have had certain types of stroke or a mini-stroke known as a transient ischemic attack (TIA). You have a narrowing of the arteries that supply the limbs (peripheral artery disease, or PAD). You have long-term (chronic) heart rhythm problems, such as atrial fibrillation, and your health care provider thinks aspirin may help. You have valve disease or have had surgery on a valve. You are considered at increased risk of developing coronary artery disease or PAD.    Follow these instructions at home Medicines Take over-the-counter and prescription medicines only as told by your health care provider. If you are taking blood thinners: Talk with your health care provider before you take any medicines that contain aspirin or NSAIDs, such as ibuprofen. These medicines increase your risk for dangerous bleeding. Take your medicine exactly as told, at the same time every day. Avoid activities that could cause injury or bruising, and follow instructions about how to prevent falls. Wear a medical alert bracelet or carry a card that lists what medicines you take. General instructions Do not drink alcohol if: Your health care provider tells you not to drink. You are pregnant, may be pregnant, or are planning to become pregnant. If you drink alcohol: Limit how much you  use to: 0-1 drink a day for women. 0-2 drinks a day for men. Be aware of how much alcohol is in your drink. In the [place] one drink equals one 12 oz bottle of beer (355 mL), one 5 oz glass of wine (148 mL), or one 1 oz glass of hard liquor (44 mL). Keep all follow-up visits as told by your health care provider. This is important. Where to find more information The American Heart Association: www.heart.org Contact a health care provider if you have: Unusual bleeding or bruising. Stomach pain or nausea. Ringing in your ears. An allergic reaction that causes hives, itchy skin, or swelling of the  lips, tongue, or face. Get help right away if: You notice that your bowel movements are bloody, or dark red or black in color. You vomit or cough up blood. You have blood in your urine. You cough, breathe loudly (wheeze), or feel short of breath. You have chest pain, especially if the pain spreads to your arms, back, neck, or jaw. You have a headache with confusion. You have any symptoms of a stroke. "BE FAST" is an easy way to remember the main warning signs of a stroke: B - Balance. Signs are dizziness, sudden trouble walking, or loss of balance. E - Eyes. Signs are trouble seeing or a sudden change in vision. F - Face. Signs are sudden weakness or numbness of the face, or the face or eyelid drooping on one side. A - Arms. Signs are weakness or numbness in an arm. This happens suddenly and usually on one side of the body. S - Speech. Signs are sudden trouble speaking, slurred speech, or trouble understanding what people say. T - Time. Time to call emergency services. Write down what time symptoms started. You have other signs of a stroke, such as: A sudden, severe headache with no known cause. Nausea or vomiting. Seizure. These symptoms may represent a serious problem that is an emergency. Do not wait to see if the symptoms will go away. Get medical help right away. Call your local emergency  services (911 in the [place] Do not drive yourself to the hospital. Summary Aspirin use can help reduce the risk of blood clots, heart attacks, and other heart-related problems. Daily use of aspirin can cause side effects. Take aspirin only as told by your health care provider. Make sure that you understand how much to take and what form to take. Your health care provider will help you determine whether it is safe and beneficial for you to take aspirin daily. This information is not intended to replace advice given to you by your health care provider. Make sure you discuss any questions you have with your health care provider. Document Revised: 10/30/2018 Document Reviewed: 10/30/2018 Elsevier Patient Education  2021 [place]. Nitroglycerin sublingual tablets What is this medicine? NITROGLYCERIN (nye troe GLI ser in) is a type of vasodilator. It relaxes blood vessels, increasing the blood and oxygen supply to your heart. This medicine is used to relieve chest pain caused by angina. It is also used to prevent chest pain before activities like climbing stairs, going outdoors in cold weather, or sexual activity. This medicine may be used for other purposes; ask your health care provider or pharmacist if you have questions. COMMON BRAND NAME(S): Nitroquick, Nitrostat, Nitrotab What should I tell my health care provider before I take this medicine? They need to know if you have any of these conditions: anemia head injury, recent stroke, or bleeding in the brain liver disease previous heart attack an unusual or allergic reaction to nitroglycerin, other medicines, foods, dyes, or preservatives pregnant or trying to get pregnant breast-feeding How should I use this medicine? Take this medicine by mouth as needed. Use at the first sign of an angina attack (chest pain or tightness). You can also take this medicine 5 to 10 minutes before an event likely to produce chest pain. Follow the directions  exactly as written on the prescription label. Place one tablet under your tongue and let it dissolve. Do not swallow whole. Replace the dose if you accidentally swallow it. It will help if your mouth is not dry. Saliva around  the tablet will help it to dissolve more quickly. Do not eat or drink, smoke or chew tobacco while a tablet is dissolving. Sit down when taking this medicine. In an angina attack, you should feel better within 5 minutes after your first dose. You can take a dose every 5 minutes up to a total of 3 doses. If you do not feel better or feel worse after 1 dose, call 9-1-1 at once. Do not take more than 3 doses in 15 minutes. Your health care provider might give you other directions. Follow those directions if he or she does. Do not take your medicine more often than directed. Talk to your health care provider about the use of this medicine in children. Special care may be needed. Overdosage: If you think you have taken too much of this medicine contact a poison control center or emergency room at once. NOTE: This medicine is only for you. Do not share this medicine with others. What if I miss a dose? This does not apply. This medicine is only used as needed. What may interact with this medicine? Do not take this medicine with any of the following medications: certain migraine medicines like ergotamine and dihydroergotamine (DHE) medicines used to treat erectile dysfunction like sildenafil, tadalafil, and vardenafil riociguat This medicine may also interact with the following medications: alteplase aspirin heparin medicines for high blood pressure medicines for mental depression other medicines used to treat angina phenothiazines like chlorpromazine, mesoridazine, prochlorperazine, thioridazine This list may not describe all possible interactions. Give your health care provider a list of all the medicines, herbs, non-prescription drugs, or dietary supplements you use. Also tell  them if you smoke, drink alcohol, or use illegal drugs. Some items may interact with your medicine. What should I watch for while using this medicine? Tell your doctor or health care professional if you feel your medicine is no longer working. Keep this medicine with you at all times. Sit or lie down when you take your medicine to prevent falling if you feel dizzy or faint after using it. Try to remain calm. This will help you to feel better faster. If you feel dizzy, take several deep breaths and lie down with your feet propped up, or bend forward with your head resting between your knees. You may get drowsy or dizzy. Do not drive, use machinery, or do anything that needs mental alertness until you know how this drug affects you. Do not stand or sit up quickly, especially if you are an older patient. This reduces the risk of dizzy or fainting spells. Alcohol can make you more drowsy and dizzy. Avoid alcoholic drinks. Do not treat yourself for coughs, colds, or pain while you are taking this medicine without asking your doctor or health care professional for advice. Some ingredients may increase your blood pressure. What side effects may I notice from receiving this medicine? Side effects that you should report to your doctor or health care professional as soon as possible: allergic reactions (skin rash, itching or hives; swelling of the face, lips, or tongue) low blood pressure (dizziness; feeling faint or lightheaded, falls; unusually weak or tired) low red blood cell counts (trouble breathing; feeling faint; lightheaded, falls; unusually weak or tired) Side effects that usually do not require medical attention (report to your doctor or health care professional if they continue or are bothersome): facial flushing (redness) headache nausea, vomiting This list may not describe all possible side effects. Call your doctor for medical advice about side effects.  You may report side effects to FDA at  1-800-FDA-1088. Where should I keep my medicine? Keep out of the reach of children. Store at room temperature between 20 and 25 degrees C (68 and 77 degrees F). Store in [employer]. Protect from light and moisture. Keep tightly closed. Throw away any unused medicine after the expiration date. NOTE: This sheet is a summary. It may not cover all possible information. If you have questions about this medicine, talk to your doctor, pharmacist, or health care provider.  2021 Elsevier/Gold Standard (2017-10-26 16:46:32)

## 2021-08-22 LAB — BASIC METABOLIC PANEL
BUN/Creatinine Ratio: 16 (ref 9–23)
BUN: 16 mg/dL (ref 6–24)
CO2: 21 mmol/L (ref 20–29)
Calcium: 9.8 mg/dL (ref 8.7–10.2)
Chloride: 106 mmol/L (ref 96–106)
Creatinine, Ser: 1.03 mg/dL — ABNORMAL HIGH (ref 0.57–1.00)
Glucose: 97 mg/dL (ref 70–99)
Potassium: 4.2 mmol/L (ref 3.5–5.2)
Sodium: 142 mmol/L (ref 134–144)
eGFR: 63 mL/min/{1.73_m2} (ref >59)

## 2021-08-22 LAB — CBC
Hematocrit: 37.5 % (ref 34.0–46.6)
Hemoglobin: 12.9 g/dL (ref 11.1–15.9)
MCH: 28.7 pg (ref 26.6–33.0)
MCHC: 34.4 g/dL (ref 31.5–35.7)
MCV: 83 fL (ref 79–97)
Platelets: 305 10*3/uL (ref 150–450)
RBC: 4.5 x10E6/uL (ref 3.77–5.28)
RDW: 13 % (ref 11.7–15.4)
WBC: 7.6 10*3/uL (ref 3.4–10.8)

## 2021-08-25 ENCOUNTER — Telehealth: Payer: Self-pay | Admitting: *Deleted

## 2021-08-25 NOTE — Telephone Encounter (Signed)
Cardiac Catheterization scheduled at [facility] for: Thursday August 27, 2021 10:30 AM Arrival time and place: [place] Entrance A at: 8:30 AM   Nothing to eat after midnight prior to procedure, clear liquids until 5 AM day of procedure.  Medication instructions: -Usual morning medications can be taken with sips of water including aspirin 81 mg.  Confirmed patient has responsible adult to drive home post procedure and be with patient first 24 hours after arriving home.  Patient reports no new symptoms concerning for COVID-19 in the past 10 days.  Reviewed procedure instructions with patient.

## 2021-08-26 ENCOUNTER — Encounter: Payer: Self-pay | Admitting: Hematology and Oncology

## 2021-08-27 ENCOUNTER — Other Ambulatory Visit: Payer: Self-pay

## 2021-08-27 ENCOUNTER — Ambulatory Visit (HOSPITAL_COMMUNITY)
Admission: RE | Admit: 2021-08-27 | Discharge: 2021-08-27 | Disposition: A | Discharge status: Home / Self Care | Payer: Medicaid Other | Attending: Interventional Cardiology | Admitting: Interventional Cardiology

## 2021-08-27 ENCOUNTER — Encounter (HOSPITAL_COMMUNITY): Payer: Self-pay | Admitting: Interventional Cardiology

## 2021-08-27 ENCOUNTER — Encounter (HOSPITAL_COMMUNITY)
Admission: RE | Disposition: A | Discharge status: Home / Self Care | Payer: Self-pay | Source: Home / Self Care | Attending: Interventional Cardiology

## 2021-08-27 DIAGNOSIS — I25119 Atherosclerotic heart disease of native coronary artery with unspecified angina pectoris: Secondary | ICD-10-CM | POA: Insufficient documentation

## 2021-08-27 DIAGNOSIS — R079 Chest pain, unspecified: Secondary | ICD-10-CM | POA: Diagnosis not present

## 2021-08-27 DIAGNOSIS — E782 Mixed hyperlipidemia: Secondary | ICD-10-CM | POA: Diagnosis not present

## 2021-08-27 DIAGNOSIS — I209 Angina pectoris, unspecified: Secondary | ICD-10-CM

## 2021-08-27 DIAGNOSIS — I429 Cardiomyopathy, unspecified: Secondary | ICD-10-CM | POA: Diagnosis not present

## 2021-08-27 DIAGNOSIS — Z6841 Body Mass Index (BMI) 40.0 and over, adult: Secondary | ICD-10-CM | POA: Diagnosis not present

## 2021-08-27 DIAGNOSIS — Z87891 Personal history of nicotine dependence: Secondary | ICD-10-CM | POA: Diagnosis not present

## 2021-08-27 DIAGNOSIS — I2510 Atherosclerotic heart disease of native coronary artery without angina pectoris: Secondary | ICD-10-CM

## 2021-08-27 DIAGNOSIS — I10 Essential (primary) hypertension: Secondary | ICD-10-CM | POA: Diagnosis not present

## 2021-08-27 HISTORY — PX: LEFT HEART CATH AND CORONARY ANGIOGRAPHY: CATH118249

## 2021-08-27 SURGERY — LEFT HEART CATH AND CORONARY ANGIOGRAPHY
Anesthesia: LOCAL

## 2021-08-27 MED ORDER — LIDOCAINE HCL (PF) 1 % IJ SOLN
INTRAMUSCULAR | Status: AC
  Filled 2021-08-27: qty 30

## 2021-08-27 MED ORDER — LIDOCAINE HCL (PF) 1 % IJ SOLN
INTRAMUSCULAR | Status: DC | PRN
  Administered 2021-08-27: 2 mL

## 2021-08-27 MED ORDER — MIDAZOLAM HCL 2 MG/2ML IJ SOLN
INTRAMUSCULAR | Status: AC
  Filled 2021-08-27: qty 2

## 2021-08-27 MED ORDER — HEPARIN (PORCINE) IN NACL 1000-0.9 UT/500ML-% IV SOLN
INTRAVENOUS | Status: DC | PRN
  Administered 2021-08-27 (×2): 500 mL

## 2021-08-27 MED ORDER — HEPARIN (PORCINE) IN NACL 1000-0.9 UT/500ML-% IV SOLN
INTRAVENOUS | Status: AC
  Filled 2021-08-27: qty 1000

## 2021-08-27 MED ORDER — SODIUM CHLORIDE 0.9 % WEIGHT BASED INFUSION: 1.0000 mL/kg/h | INTRAVENOUS | Status: DC

## 2021-08-27 MED ORDER — MIDAZOLAM HCL 2 MG/2ML IJ SOLN
INTRAMUSCULAR | Status: DC | PRN
  Administered 2021-08-27: 2 mg via INTRAVENOUS
  Administered 2021-08-27: 1 mg via INTRAVENOUS

## 2021-08-27 MED ORDER — SODIUM CHLORIDE 0.9 % IV SOLN: INTRAVENOUS | Status: AC

## 2021-08-27 MED ORDER — VERAPAMIL HCL 2.5 MG/ML IV SOLN: INTRAVENOUS | Status: DC | PRN

## 2021-08-27 MED ORDER — ACETAMINOPHEN 325 MG PO TABS: 650.0000 mg | ORAL_TABLET | ORAL | Status: DC | PRN

## 2021-08-27 MED ORDER — LABETALOL HCL 5 MG/ML IV SOLN
10.0000 mg | INTRAVENOUS | Status: DC | PRN
  Administered 2021-08-27: 10 mg via INTRAVENOUS
  Filled 2021-08-27: qty 4

## 2021-08-27 MED ORDER — SODIUM CHLORIDE 0.9 % IV SOLN: 250.0000 mL | INTRAVENOUS | Status: DC | PRN

## 2021-08-27 MED ORDER — HEPARIN SODIUM (PORCINE) 1000 UNIT/ML IJ SOLN
INTRAMUSCULAR | Status: AC
  Filled 2021-08-27: qty 10

## 2021-08-27 MED ORDER — ONDANSETRON HCL 4 MG/2ML IJ SOLN: 4.0000 mg | Freq: Four times a day (QID) | INTRAMUSCULAR | Status: DC | PRN

## 2021-08-27 MED ORDER — FENTANYL CITRATE (PF) 100 MCG/2ML IJ SOLN
INTRAMUSCULAR | Status: AC
  Filled 2021-08-27: qty 2

## 2021-08-27 MED ORDER — VERAPAMIL HCL 2.5 MG/ML IV SOLN
INTRAVENOUS | Status: AC
  Filled 2021-08-27: qty 2

## 2021-08-27 MED ORDER — SODIUM CHLORIDE 0.9% FLUSH: 3.0000 mL | Freq: Two times a day (BID) | INTRAVENOUS | Status: DC

## 2021-08-27 MED ORDER — HEPARIN SODIUM (PORCINE) 1000 UNIT/ML IJ SOLN
INTRAMUSCULAR | Status: DC | PRN
  Administered 2021-08-27: 5000 [IU] via INTRAVENOUS

## 2021-08-27 MED ORDER — FENTANYL CITRATE (PF) 100 MCG/2ML IJ SOLN
INTRAMUSCULAR | Status: DC | PRN
  Administered 2021-08-27 (×2): 25 ug via INTRAVENOUS

## 2021-08-27 MED ORDER — ASPIRIN 81 MG PO CHEW: 81.0000 mg | CHEWABLE_TABLET | ORAL | Status: DC

## 2021-08-27 MED ORDER — IOHEXOL 350 MG/ML SOLN
INTRAVENOUS | Status: DC | PRN
  Administered 2021-08-27: 90 mL

## 2021-08-27 MED ORDER — SODIUM CHLORIDE 0.9 % WEIGHT BASED INFUSION
3.0000 mL/kg/h | INTRAVENOUS | Status: AC
  Administered 2021-08-27: 3 mL/kg/h via INTRAVENOUS

## 2021-08-27 MED ORDER — SODIUM CHLORIDE 0.9% FLUSH: 3.0000 mL | INTRAVENOUS | Status: DC | PRN

## 2021-08-27 MED ORDER — HYDRALAZINE HCL 20 MG/ML IJ SOLN: 10.0000 mg | INTRAMUSCULAR | Status: DC | PRN

## 2021-08-27 SURGICAL SUPPLY — 12 items
BAND ZEPHYR COMPRESS 30 LONG (HEMOSTASIS) ×1 IMPLANT
CATH 5FR JL3.5 JR4 ANG PIG MP (CATHETERS) ×1 IMPLANT
CATH INFINITI 5 FR 3DRC (CATHETERS) ×1 IMPLANT
CATH INFINITI 5 FR AR1 MOD (CATHETERS) ×1 IMPLANT
CATH INFINITI 5FR AL1 (CATHETERS) ×1 IMPLANT
GLIDESHEATH SLEND SS 6F .021 (SHEATH) ×1 IMPLANT
GUIDEWIRE INQWIRE 1.5J.035X260 (WIRE) IMPLANT
INQWIRE 1.5J .035X260CM (WIRE) ×2
KIT HEART LEFT (KITS) ×2 IMPLANT
PACK CARDIAC CATHETERIZATION (CUSTOM PROCEDURE TRAY) ×2 IMPLANT
TRANSDUCER W/STOPCOCK (MISCELLANEOUS) ×2 IMPLANT
TUBING CIL FLEX 10 FLL-RA (TUBING) ×2 IMPLANT

## 2021-08-27 NOTE — Interval H&P Note (Signed)
Cath Lab Visit (complete for each Cath Lab visit)  Clinical Evaluation Leading to the Procedure:   ACS: No.  Non-ACS:    Anginal Classification: CCS III  Anti-ischemic medical therapy: Minimal Therapy (1 class of medications)  Non-Invasive Test Results: No non-invasive testing performed  Prior CABG: No previous CABG      History and Physical Interval Note:  08/27/2021 9:31 AM  Brandi Weaver  has presented today for surgery, with the diagnosis of angina.  The various methods of treatment have been discussed with the patient and family. After consideration of risks, benefits and other options for treatment, the patient has consented to  Procedure(s): LEFT HEART CATH AND CORONARY ANGIOGRAPHY (N/A) as a surgical intervention.  The patient's history has been reviewed, patient examined, no change in status, stable for surgery.  I have reviewed the patient's chart and labs.  Questions were answered to the patient's satisfaction.     Larae Grooms

## 2021-09-29 ENCOUNTER — Encounter: Payer: Self-pay | Admitting: Cardiology

## 2021-09-29 ENCOUNTER — Ambulatory Visit (INDEPENDENT_AMBULATORY_CARE_PROVIDER_SITE_OTHER): Payer: Medicaid Other | Admitting: Cardiology

## 2021-09-29 VITALS — BP 118/60 | HR 100 | Ht 63.0 in | Wt 250.0 lb

## 2021-09-29 DIAGNOSIS — I429 Cardiomyopathy, unspecified: Secondary | ICD-10-CM | POA: Diagnosis not present

## 2021-09-29 DIAGNOSIS — E785 Hyperlipidemia, unspecified: Secondary | ICD-10-CM | POA: Diagnosis not present

## 2021-09-29 MED ORDER — ENTRESTO 24-26 MG PO TABS: 1.0000 | ORAL_TABLET | Freq: Two times a day (BID) | ORAL | 12 refills | Status: AC

## 2021-09-29 NOTE — Patient Instructions (Signed)
You need a nurse visit in 2 weeks for vital signs, labs and to review your BP log.  Please keep a BP log for 2 weeks and bring to your nurse visit.  Blood Pressure Record Sheet To take your blood pressure, you will need a blood pressure machine. You can buy a blood pressure machine (blood pressure monitor) at your clinic, drug store, or online. When choosing one, consider: An automatic monitor that has an arm cuff. A cuff that wraps snugly around your upper arm. You should be able to fit only one finger between your arm and the cuff. A device that stores blood pressure reading results. Do not choose a monitor that measures your blood pressure from your wrist or finger. Follow your health care provider's instructions for how to take your blood pressure. To use this form: Get one reading in the morning (a.m.) 1-2 hours after you take any medicines. Get one reading in the evening (p.m.) before supper. Write down the results in the spaces on this form.   Make a follow-up appointment with your health care provider to discuss the results. Blood pressure log Date: _______________________  a.m. _____________________(1st reading) HR___________            p.m. _____________________(2nd reading) HR__________  Date: _______________________  a.m. _____________________(1st reading) HR___________            p.m. _____________________(2nd reading) HR__________  Date: _______________________  a.m. _____________________(1st reading) HR___________            p.m. _____________________(2nd reading) HR__________  Date: _______________________  a.m. _____________________(1st reading) HR___________            p.m. _____________________(2nd reading) HR__________  Date: _______________________  a.m. _____________________(1st reading) HR___________            p.m. _____________________(2nd reading) HR__________  Date: _______________________  a.m. _____________________(1st reading)  HR___________            p.m. _____________________(2nd reading) HR__________  Date: _______________________  a.m. _____________________(1st reading) HR___________            p.m. _____________________(2nd reading) HR__________   This information is not intended to replace advice given to you by your health care provider. Make sure you discuss any questions you have with your health care provider. Document Revised: 05/16/2019 Document Reviewed: 05/16/2019 Elsevier Patient Education  [place].    Medication Instructions:  Your physician has recommended you make the following change in your medication:   Stop Propranolol  Start Entresto 24-26 mg twice daily.  *If you need a refill on your cardiac medications before your next appointment, please call your pharmacy*   Lab Work:  Your physician recommends that you return for lab work in: 2 weeks You need to have labs done when you are fasting.  You can come Monday through Friday 8:30 am to 12:00 pm and 1:15 to 4:30. You do not need to make an appointment as the order has already been placed. The labs you are going to have done are BMET, LFT and Lipids.  If you have labs (blood work) drawn today and your tests are completely normal, you will receive your results only by: [place] (if you have MyChart) OR A paper copy in the mail If you have any lab test that is abnormal or we need to change your treatment, we will call you to review the results.   Testing/Procedures: None ordered   Follow-Up: At [facility], you and your health needs are our priority.  As part of our  continuing mission to provide you with exceptional heart care, we have created designated Provider Care Teams.  These Care Teams include your primary Cardiologist (physician) and Advanced Practice Providers (APPs -  Physician Assistants and Nurse Practitioners) who all work together to provide you with the care you need, when you need it.  We  recommend signing up for the patient portal called "MyChart".  Sign up information is provided on this After Visit Summary.  MyChart is used to connect with patients for Virtual Visits (Telemedicine).  Patients are able to view lab/test results, encounter notes, upcoming appointments, etc.  Non-urgent messages can be sent to your provider as well.   To learn more about what you can do with MyChart, go to [email or web address].    Your next appointment:   9 month(s)  The format for your next appointment:   In Person  Provider:   Jyl Heinz, MD   Other Instructions Sacubitril; Valsartan Tablets What is this medication? SACUBITRIL; VALSARTAN (sak UE bi tril; val SAR tan) treats heart failure. It works by relaxing blood vessels, which decreases the amount of work the heart has to do. It also helps your kidneys remove more fluid and salt from your blood through the urine. It is a combination of a neprilysin inhibitor and an ARB. This medicine may be used for other purposes; ask your health care provider or pharmacist if you have questions. COMMON BRAND NAME(S): Entresto What should I tell my care team before I take this medication? They need to know if you have any of these conditions: Diabetes and take a medication that contains aliskiren High levels of potassium in the blood Kidney disease Liver disease Low blood pressure An unusual or allergic reaction to sacubitril, valsartan, other medications, foods, dyes, or preservatives Pregnant or trying to get pregnant Breast-feeding How should I use this medication? Take this medication by mouth. Take it as directed on the prescription label at the same time every day. You can take it with or without food. If it upsets your stomach, take it with food. Keep taking it unless your care team tells you to stop. Talk to your care team about the use of this medication in children. While it may be prescribed for children as young as 1 year for  selected conditions, precautions do apply. Overdosage: If you think you have taken too much of this medicine contact a poison control center or emergency room at once. NOTE: This medicine is only for you. Do not share this medicine with others. What if I miss a dose? If you miss a dose, take it as soon as you can. If it is almost time for your next dose, take only that dose. Do not take double or extra doses. What may interact with this medication? Do not take this medication with any of the following: Aliskiren if you have diabetes Angiotensin-converting enzyme (ACE) inhibitors, such as benazepril, captopril, enalapril, fosinopril, lisinopril, or ramipril Tranylcypromine This medication may also interact with the following: Angiotensin II receptor blockers (ARBs), such as azilsartan, candesartan, eprosartan, irbesartan, losartan, olmesartan, telmisartan, or valsartan Celecoxib Lithium NSAIDS, medications for pain and inflammation, such as ibuprofen or naproxen Potassium-sparing diuretics, such as amiloride, spironolactone, and triamterene Potassium supplements This list may not describe all possible interactions. Give your health care provider a list of all the medicines, herbs, non-prescription drugs, or dietary supplements you use. Also tell them if you smoke, drink alcohol, or use illegal drugs. Some items may interact with your medicine. What should  I watch for while using this medication? Tell your care team if your symptoms do not start to get better or if they get worse. Talk to your care team if you wish to become pregnant or think you might be pregnant. This medication can cause serious birth defects. This medication may affect your coordination, reaction time, or judgment. Do not drive or operate machinery until you know how this medication affects you. Sit up or stand slowly to reduce the risk of dizzy or fainting spells. Drinking alcohol with this medication can increase the risk of  these side effects. Avoid salt substitutes unless you are told otherwise by your care team. What side effects may I notice from receiving this medication? Side effects that you should report to your care team as soon as possible: Allergic reactions or angioedema--skin rash, itching or hives, swelling of the face, eyes, lips, tongue, arms, or legs, trouble swallowing or breathing High potassium level--muscle weakness, fast or irregular heartbeat Kidney injury--decrease in the amount of urine, swelling of the ankles, hands, or feet Low blood pressure--dizziness, feeling faint or lightheaded, blurry vision Side effects that usually do not require medical attention (report to your care team if they continue or are bothersome): Cough This list may not describe all possible side effects. Call your doctor for medical advice about side effects. You may report side effects to FDA at 1-800-FDA-1088. Where should I keep my medication? Keep out of the reach of children and pets. Store at room temperature between 20 and 25 degrees C (68 and 77 degrees F). Protect from moisture. Keep the container tightly closed. Get rid of any unused medication after the expiration date. To get rid of medications that are no longer needed or have expired: Take the medication to a take-back program. Check with your pharmacy or law enforcement to find a location. If you cannot return the medication, check the label or package insert to see if the medication should be thrown out in the garbage or flushed down the toilet. If you are not sure, ask your care team. If it is safe to put it in the trash, empty the medication out of the container. Mix the medication with cat litter, dirt, coffee grounds, or other unwanted substance. Seal the mixture in a bag or container. Put it in the trash. NOTE: This sheet is a summary. It may not cover all possible information. If you have questions about this medicine, talk to your doctor,  pharmacist, or health care provider.  2023 Elsevier/Gold Standard (2020-12-09 00:00:00)

## 2021-09-29 NOTE — Progress Notes (Signed)
Cardiology Office Note:    Date:  09/29/2021   ID:  Brandi Weaver, DOB 11/18/1965, MRN [id number]  PCP:  Buena Irish, MD  Cardiologist:  Jenean Lindau, MD   Referring MD: No ref. provider found    ASSESSMENT:    No diagnosis found. PLAN:    In order of problems listed above:  Nonobstructive coronary artery disease: Secondary prevention stressed with the patient.  Importance of compliance with diet medication stressed and she vocalized understanding.  She was advised to ambulate to the best of her ability. Cardiomyopathy: Mildly depressed ejection fraction.  I discussed Entresto and she is agreeable.  We will discontinue propranolol.  We will start low-dose Entresto and she will keep a track of her blood pressures.  She will be back in a week for a pulse blood pressure check and at that time we will do complete blood work including fasting lipids.  Benefits and potential risks of Entresto explained and she vocalized understanding and questions were answered to her satisfaction. Mixed lipidemia: On statin therapy.  We will have blood work for fasting lipids in the next few days.  As mentioned above. Morbid obesity: Weight reduction stressed diet emphasized and she promises to do better. Patient will be seen in follow-up appointment in 6 months or earlier if the patient has any concerns    Medication Adjustments/Labs and Tests Ordered: Current medicines are reviewed at length with the patient today.  Concerns regarding medicines are outlined above.  No orders of the defined types were placed in this encounter.  No orders of the defined types were placed in this encounter.    No chief complaint on file.    History of Present Illness:    Brandi Weaver is a 56 y.o. female.  Patient has past medical history of nonobstructive coronary artery disease by recent coronary angiography, cardiomyopathy, essential hypertension and dyslipidemia.  She also has thyroid  problems.  She denies any chest pain orthopnea or PND.  At the time of my evaluation, the patient is alert awake oriented and in no distress.  Past Medical History:  Diagnosis Date   CAD (coronary artery disease) 09/06/2019   Cardiomyopathy ([place]) 06/26/2019   Chest pain on exertion 10/18/2016   Depressed left ventricular ejection fraction 08/05/2020   Hypertension 10/18/2016   Malignant neoplasm of left breast ([place])    Malignant neoplasm of left female breast ([place]) 11/23/2019   Morbid (severe) obesity due to excess calories ([place]) 06/25/2019   Formatting of this note might be different from the original. 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 Added automatically from request for surgery [id number]   Tobacco use disorder 10/19/2016    Past Surgical History:  Procedure Laterality Date   ABLATION     LEFT HEART CATH AND CORONARY ANGIOGRAPHY N/A 08/27/2021   Procedure: LEFT HEART CATH AND CORONARY ANGIOGRAPHY;  Surgeon: Jettie Booze, MD;  Location: [facility];  Service: Cardiovascular;  Laterality: N/A;   TUBAL LIGATION      Current Medications: Current Meds  Medication Sig   acetaminophen (TYLENOL) 500 MG tablet Take 500 mg by mouth every 6 (six) hours as needed (pain.).   Artificial Tear Solution (GENTEAL TEARS) 0.1-0.2-0.3 % SOLN Place 1 drop into both eyes at bedtime.   aspirin EC 81 MG tablet Take 81 mg by mouth daily. Swallow whole.   cetirizine (  ZYRTEC) 10 MG tablet Take 10 mg by mouth daily as needed for allergies.   clonazePAM (KLONOPIN) 1 MG tablet Take 1 mg by mouth 2 (two) times daily.   doxycycline (VIBRAMYCIN) 100 MG capsule Take 100 mg by mouth 2 (two) times daily.   DULoxetine (CYMBALTA) 30 MG capsule Take 30 mg by mouth in the morning.   famotidine (PEPCID) 40 MG tablet Take 40 mg by mouth in the morning.   folic acid (FOLVITE) 1 MG tablet Take 1 mg by mouth in the  morning.   gabapentin (NEURONTIN) 600 MG tablet Take 600 mg by mouth 2 (two) times daily.   hydrOXYzine (ATARAX/VISTARIL) 25 MG tablet Take 25 mg by mouth 4 (four) times daily as needed for itching or rash.   levothyroxine (SYNTHROID) 75 MCG tablet Take 75 mcg by mouth daily.   loratadine (CLARITIN) 10 MG tablet Take 10 mg by mouth daily as needed for allergies.   nitroGLYCERIN (NITROSTAT) 0.4 MG SL tablet Place 0.4 mg under the tongue every 5 (five) minutes as needed for chest pain.   ondansetron (ZOFRAN) 4 MG tablet Take 4 mg by mouth every 4 (four) hours as needed for nausea/vomiting.   Oxycodone HCl 10 MG TABS Take 10 mg by mouth in the morning, at noon, in the evening, and at bedtime.   propranolol (INDERAL) 40 MG tablet Take 40 mg by mouth in the morning.   traZODone (DESYREL) 50 MG tablet Take 50 mg by mouth at bedtime.   venlafaxine XR (EFFEXOR-XR) 150 MG 24 hr capsule Take 150 mg by mouth in the morning.   Vitamin D, Ergocalciferol, (DRISDOL) 1.25 MG (50000 UNIT) CAPS capsule Take 50,000 Units by mouth every Monday.     Allergies:   Sulfa antibiotics   Social History   Socioeconomic History   Marital status: Widowed    Spouse name: Not on file   Number of children: Not on file   Years of education: Not on file   Highest education level: Not on file  Occupational History   Not on file  Tobacco Use   Smoking status: Former    Types: Cigarettes    Quit date: 06/05/2019    Years since quitting: 2.3   Smokeless tobacco: Never  Vaping Use   Vaping Use: Never used  Substance and Sexual Activity   Alcohol use: Not Currently   Drug use: Never   Sexual activity: Not Currently  Other Topics Concern   Not on file  Social History Narrative   Not on file   Social Determinants of Health   Financial Resource Strain: Not on file  Food Insecurity: Not on file  Transportation Needs: Not on file  Physical Activity: Not on file  Stress: Not on file  Social Connections: Not on  file     Family History: The patient's family history includes Heart disease in her father; Hypertension in her father; Stroke in her father; Throat cancer in her mother.  ROS:   Please see the history of present illness.    All other systems reviewed and are negative.  EKGs/Labs/Other Studies Reviewed:    The following studies were reviewed today: I discussed findings of the coronary angiography with the patient.   Recent Labs: 07/27/2021: ALT 21 08/21/2021: BUN 16; Creatinine, Ser 1.03; Hemoglobin 12.9; Platelets 305; Potassium 4.2; Sodium 142  Recent Lipid Panel    Component Value Date/Time   CHOL 230 (H) 12/07/2019 1002   TRIG 205 (H) 12/07/2019 1002   HDL  41 12/07/2019 1002   CHOLHDL 5.6 (H) 12/07/2019 1002   LDLCALC 152 (H) 12/07/2019 1002    Physical Exam:    VS:  BP 118/60 (BP Location: Right Arm, Patient Position: Sitting)   Pulse 100   Ht '5\' 3"'$  (1.6 m)   Wt 250 lb (113.4 kg)   SpO2 97%   BMI 44.29 kg/m     Wt Readings from Last 3 Encounters:  09/29/21 250 lb (113.4 kg)  08/27/21 243 lb (110.2 kg)  08/21/21 244 lb 9.6 oz (110.9 kg)     GEN: Patient is in no acute distress HEENT: Normal NECK: No JVD; No carotid bruits LYMPHATICS: No lymphadenopathy CARDIAC: Hear sounds regular, 2/6 systolic murmur at the apex. RESPIRATORY:  Clear to auscultation without rales, wheezing or rhonchi  ABDOMEN: Soft, non-tender, non-distended MUSCULOSKELETAL:  No edema; No deformity  SKIN: Warm and dry NEUROLOGIC:  Alert and oriented x 3 PSYCHIATRIC:  Normal affect   Signed, Jenean Lindau, MD  09/29/2021 3:48 PM    [facility]

## 2021-10-13 ENCOUNTER — Ambulatory Visit: Payer: Medicaid Other | Attending: Cardiology

## 2021-10-13 VITALS — BP 118/82 | HR 90 | Resp 18 | Wt 245.0 lb

## 2021-10-13 DIAGNOSIS — I429 Cardiomyopathy, unspecified: Secondary | ICD-10-CM

## 2021-10-13 NOTE — Progress Notes (Signed)
   Nurse Visit   Date of Encounter: 10/13/2021 ID: [name], DOB 1965-07-10, MRN [id number]  PCP:  Buena Irish, MD   [place] Providers Cardiologist:  Revankar   Visit Details   VS:  BP 118/82   Pulse 90   Resp 18   Wt 245 lb (111.1 kg)   BMI 43.40 kg/m  , BMI Body mass index is 43.4 kg/m.  Wt Readings from Last 3 Encounters:  10/13/21 245 lb (111.1 kg)  09/29/21 250 lb (113.4 kg)  08/27/21 243 lb (110.2 kg)     Reason for visit: nurse visit for vital signs, labs and review BP log. Performed today: Vitals, Provider consulted:Revankar, and Education Changes (medications, testing, etc.) : none Length of Visit: 15 minutes    Medications Adjustments/Labs and Tests Ordered: No orders of the defined types were placed in this encounter.  No orders of the defined types were placed in this encounter.    Signed, Truddie Hidden, RN  10/13/2021 11:23 AM

## 2021-10-14 LAB — BASIC METABOLIC PANEL
BUN/Creatinine Ratio: 13 (ref 9–23)
BUN: 14 mg/dL (ref 6–24)
CO2: 16 mmol/L — ABNORMAL LOW (ref 20–29)
Calcium: 9.9 mg/dL (ref 8.7–10.2)
Chloride: 102 mmol/L (ref 96–106)
Creatinine, Ser: 1.12 mg/dL — ABNORMAL HIGH (ref 0.57–1.00)
Glucose: 113 mg/dL — ABNORMAL HIGH (ref 70–99)
Potassium: 3.7 mmol/L (ref 3.5–5.2)
Sodium: 138 mmol/L (ref 134–144)
eGFR: 57 mL/min/{1.73_m2} — ABNORMAL LOW (ref >59)

## 2021-10-14 LAB — LIPID PANEL
Chol/HDL Ratio: 6.6 ratio — ABNORMAL HIGH (ref 0.0–4.4)
Cholesterol, Total: 265 mg/dL — ABNORMAL HIGH (ref 100–199)
HDL: 40 mg/dL (ref >39)
LDL Chol Calc (NIH): 195 mg/dL — ABNORMAL HIGH (ref 0–99)
Triglycerides: 159 mg/dL — ABNORMAL HIGH (ref 0–149)
VLDL Cholesterol Cal: 30 mg/dL (ref 5–40)

## 2021-10-14 LAB — HEPATIC FUNCTION PANEL
ALT: 17 IU/L (ref 0–32)
AST: 20 IU/L (ref 0–40)
Albumin: 4.5 g/dL (ref 3.8–4.9)
Alkaline Phosphatase: 81 IU/L (ref 44–121)
Bilirubin Total: 0.5 mg/dL (ref 0.0–1.2)
Bilirubin, Direct: 0.11 mg/dL (ref 0.00–0.40)
Total Protein: 7.5 g/dL (ref 6.0–8.5)

## 2021-10-14 MED ORDER — ATORVASTATIN CALCIUM 20 MG PO TABS: 20.0000 mg | ORAL_TABLET | Freq: Every day | ORAL | 3 refills | Status: DC

## 2021-10-14 NOTE — Addendum Note (Signed)
Addended by: Truddie Hidden on: 10/14/2021 05:47 PM   Modules accepted: Orders

## 2021-11-20 DIAGNOSIS — F339 Major depressive disorder, recurrent, unspecified: Secondary | ICD-10-CM | POA: Insufficient documentation

## 2021-11-20 DIAGNOSIS — D259 Leiomyoma of uterus, unspecified: Secondary | ICD-10-CM | POA: Insufficient documentation

## 2021-11-20 DIAGNOSIS — N926 Irregular menstruation, unspecified: Secondary | ICD-10-CM | POA: Insufficient documentation

## 2021-11-20 DIAGNOSIS — J209 Acute bronchitis, unspecified: Secondary | ICD-10-CM | POA: Insufficient documentation

## 2021-11-20 DIAGNOSIS — N854 Malposition of uterus: Secondary | ICD-10-CM | POA: Insufficient documentation

## 2021-11-20 DIAGNOSIS — K5900 Constipation, unspecified: Secondary | ICD-10-CM | POA: Insufficient documentation

## 2021-11-20 DIAGNOSIS — N920 Excessive and frequent menstruation with regular cycle: Secondary | ICD-10-CM | POA: Insufficient documentation

## 2021-11-20 DIAGNOSIS — R1012 Left upper quadrant pain: Secondary | ICD-10-CM | POA: Insufficient documentation

## 2021-11-27 ENCOUNTER — Other Ambulatory Visit: Payer: Self-pay | Admitting: Cardiology

## 2022-01-19 NOTE — Progress Notes (Addendum)
[place]  [address] [place],  [place]  [address] [phone]  Clinic Day: 01/20/22   Referring physician: Buena Irish, *  ASSESSMENT & PLAN:   Assessment & Plan: 1. 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  No primary breast lesion was found.  She was treated with neoadjuvant docetaxel/carboplatin/trastuzumab, left axillary lymph node dissection and adjuvant radiation therapy to the left breast and axilla. She completed 1 year of maintenance trastuzumab in June 2022. She remains without evidence of malignancy.   2. Cardiomyopathy with decreased left ventricular ejection fraction.  This is being managed by [name].  [name] from September revealed an ejection fraction of 39%, which was stable. Echocardiogram in November revealed an ejection fraction between 60-65%, which was a puzzle, as it has never been normal. Echocardiogram in March revealed an ejection fraction of 45-50%, which is closer to her baseline. She remains without symptoms of heart failure. [name] from April 2022 revealed stable left ventricular function, with an ejection fraction of 41%.   3. Bilateral hip and back pain, which is chronic.  MRI lumbar spine and left hip did not reveal any evidence of malignancy.  There were degenerative changes in the lumbar spine at L5-S1 several years ago. I am concerned about the possibility of spinal stenosis with her frequent falls. Unfortunately she will need to back off the NSAID's due to her elevated creatinine. She is having significant back pain at this time and is being followed with an orthopedic surgeon.  Plan Her port was removed by [name]. He will have a mammogram and follow up scheduled in March. She has no evidence of disease.  We will plan to see her back in 6 months with CBC and CMP for repeat clinical assessment. The patient understands the plans discussed today  and is in agreement with them.  She knows to contact our office if she develops concerns prior to her next appointment.   I provided 15 minutes of face-to-face time during this this encounter and > 50% was spent counseling as documented under my assessment and plan.    Derwood Kaplan, MD  [facility] [address] [place] [address] Dept: [phone] Dept Fax: [phone]   No orders of the defined types were placed in this encounter.   CHIEF COMPLAINT:  CC: Stage IIA HER2 positive breast cancer  Current Treatment:  Observation   HISTORY OF PRESENT ILLNESS:  Stage IIA (T0 N1 M0) HER2 receptor positive left breast cancer diagnosed in April 2021.  The patient felt a mass in the left axilla in March.  Her last bilateral screening mammogram had been in November 2019 with a diagnostic left mammogram and ultrasound in December which revealed probable inframammary lymph node over the upper outer quadrant.  Six-month follow-up was recommended. Ultrasound in March 2021 revealed multiple hypoechoic lesions of the left axilla with the largest up to 4.6 cm in diameter with another measuring 3.3 cm and another 2.6 cm.  Bilateral diagnostic mammogram at [facility] March revealed at least 7 enlarged intramammary and axillary lymph nodes.  No primary breast lesion was found.  Targeted left breast ultrasound revealed an increase in the previously demonstrated 7 mm intramammary node at 1:30 o'clock measured 13 mm with diffuse cortical thickening.  There were multiple enlarged left axillary nodes with eccentric cortical thickening with a maximum thickness of  19.8 mm.  Ultrasound-guided biopsy of the left axillary lymph node in April revealed metastatic carcinoma consistent with breast primary. Estrogen and progesterone receptors were negative and HER 2 positive.  Ki 67 was 40%.  Staging CT chest, abdomen and pelvis revealed bulky  left axillary lymphadenopathy extending to the sub pectoralis nodal station with a 6 mm right upper paratracheal lymph node.  There was no evidence of metastatic disease in the lungs, abdomen or pelvis.  Breast MRI revealed a 1 x 1.2 oval enhancing mass within the posterior upper outer left breast felt to be an intraparenchymal lymph node, no breast masses were seen.  There was re-demonstration of the left axillary adenopathy.  On physical examination the left axillary adenopathy measured approximately 9 x 7 cm.  She had uterine ablation in 2011 for uterine fibroids, then experienced 1 day of uterine bleeding in 2012, but denies further bleeding.    We recommended neoadjuvant chemotherapy to include TCHP x 6 cycles with trastuzumab and pertuzumab for one full year.  Unfortunately, echocardiogram  In May revealed ejection fraction between 40 and 45%.  This was confirmed with [name] on May 10th, which revealed an ejection fraction of 42%.  Based on these findings, we changed the treatment regimen to docetaxel/carboplatin/trastuzumab [facility]) and eliminated pertuzumab, due to the potential cardiotoxicities from HER2 targeted therapies.  We also recommended repeat cardiac imaging every 6 weeks and instead of the standard every 12 weeks.  She was referred to [name] and he started her on Entresto and has been monitoring her cardiac function.  She reported some chest tightness to him, so he recommended a [name].  She had quit smoking.  She received her 1st cycle of docetaxel, carboplatin and trastuzumab in May.  She experienced toxicities of decreased appetite, minimal nausea and vomiting, and generalized arthralgias.  Her biggest complaint was generalized achiness and hip pain, felt to be most likely due to [name].  Her pain was not controlled with hydrocodone/APAP 5/325 or 10/325, so she was given oxycodone 5 mg 1-2 tablets every 6 hours as needed for pain.  She was placed on esomeprazole 40 mg daily for  acid reflux. She quit smoking in April 2021.   She continued Entresto daily.  She underwent nuclear stress test at [name] office in June, which revealed a fairly stable ejection fraction of 39%.  She was also recommended for CT coronary angiography, which is to be scheduled at [facility].  She underwent a coronary/cardiac CT in July revealed moderate CAD in proximal LAD and mild CAD in mid left main coronary artery, CADRADS = 3, coronary calcium score is 168, which places the patient in the 24 percentile for age and dex matched control, normal coronary origin with right dominance and mildly dilated main pulmonary artery, 30 mm. [name] from August revealed a left ventricular ejection fraction of 39%, previously 42% in May, which was discussed with [name].  We discussed the risks and benefits with the patient again.  Overall, we feel that the benefit outweighs any further risk, as long as she is closely monitored.  Bilateral breast MRI in September revealed a decrease in the left axillary lymphadenopathy.  However, there was a persistent, but smaller circumscribed oval mass in the upper outer quadrant of the left breast.  We discussed this with [name] of breast radiology and we suspect this could represent the breast primary, but biopsy was negative.  [name] imaging in September revealed a left ventricular fraction is 39%,  which was stable.    Ultrasound guided left breast core needle biopsy in September revealed lymphoid tissue with reactive lymphoid hyperplasia and was negative for granulomas or malignancy.  No metastatic breast carcinoma could be seen.  She then underwent left axillary lymph node dissection on October 14th with [name]. Surgical pathology from this procedure found tumor present in 3 of 27 lymph nodes (3/27).  One lymph node was positive for macrometastasis measuring 56m.  Two lymph nodes had micrometastasis.  There were no lymph nodes with isolated tumor cells and no  extranodal tumor seen. Echocardiogram in November revealed a normal ejection fraction between 60-65% , which was a puzzle, as very different from her baseline.  MRI lumbar spine and left hip revealed mild-to-moderate facet arthropathy at L5-S1, which was worse on the right.  MRI left hip was negative.  She completed 1 year of trastuzumab in June 2022.  Echocardiogram in March revealed an ejection fraction of 45-50%, which although decreased from November, was closer to her baseline. We decided to continue with trastuzumab. Our pharmacist recommended a [name] in 6 weeks instead of echo. [name] scan on April 25th revealed a slight increase in the ejection fraction at 41%. She did complete 1 full year of trastuzumab and continues to be followed by Cardiology.  Oncology History  Malignant ne[place] of left breast ([facility]  06/01/2019 Cancer Staging   Staging form: Breast, AJCC 8th Edition - Clinical stage from 06/01/2019: cT0, cN1(f), cM0, ER-, PR-, HER2+ - Signed by MDerwood Kaplan MD on 12/31/2019   09/05/2019 Initial Diagnosis   Malignant [place] of left breast ([facility]   11/22/2019 Cancer Staging   Staging form: Breast, AJCC 8th Edition - Pathologic stage from 11/22/2019: No Stage Recommended (ypT0, pN1, cM0, ER-, PR-, HER2+) - Signed by MDerwood Kaplan MD on 12/31/2019 Prognostic indicators: 3/27 nodes pos., no primary found   07/16/2020 Imaging   Bilateral diagnostic mammogram:  FINDINGS:  Surgical changes are seen in the left axilla. There are changes of  radiation therapy in the left breast. No suspicious mass or  malignant type microcalcifications identified in either breast.  IMPRESSION:  No evidence of malignancy in either breast.  RECOMMENDATION:  Bilateral diagnostic mammogram in 1 year is recommended.    Malignant [facility] of left female breast ([facility]  06/22/2019 - 08/01/2020 Chemotherapy   Patient is on Treatment Plan : BREAST  Trastuzumab Maintenance q21d     11/23/2019 Initial  Diagnosis   Malign[facility] of left female breast ([facility]   07/16/2020 Imaging   Bilateral diagnostic mammogram:  FINDINGS:  Surgical changes are seen in the left axilla. There are changes of  radiation therapy in the left breast. No suspicious mass or  malignant type microcalcifications identified in either breast.  IMPRESSION:  No evidence of malignancy in either breast.  RECOMMENDATION:  Bilateral diagnostic mammogram in 1 year is recommended.       INTERVAL HISTORY:  LDakariis here for routine follow up and states that she is well and doesn't personally feel any lumps. She has since had her port removed. Patient continues to have back and hip pain from her significant osteoarthritis and degenerative disk disease. She says that epidural injections provide some relief while also taking physical therapy. Her spine specialist suggested a nerve block. I do not see any signs of cancer. Her labs are still pending but previous labs are adequate. I will see her back every 6 months with CBC and CMP. Her  appetite is good,  and she has gained 3 pounds since her last visit 3 months ago.  She denies fever, chills or other signs of infection.  She denies nausea, vomiting, bowel issues, or abdominal pain.  She denies sore throat, cough, dyspnea, or chest pain.   REVIEW OF SYSTEMS:  Review of Systems  Constitutional:  Negative for appetite change, chills, fever and unexpected weight change.  HENT:  Negative.  Negative for lump/mass, mouth sores and sore throat.   Eyes: Negative.   Respiratory: Negative.  Negative for chest tightness, cough, hemoptysis, shortness of breath and wheezing.   Cardiovascular: Negative.  Negative for chest pain, leg swelling and palpitations.  Gastrointestinal: Negative.  Negative for abdominal distention, abdominal pain, blood in stool, constipation, diarrhea, nausea and vomiting.  Endocrine: Negative.   Genitourinary: Negative.  Negative for difficulty urinating, dysuria,  frequency and hematuria.   Musculoskeletal:  Negative for arthralgias, back pain, flank pain and gait problem.  Skin: Negative.   Neurological:  Negative for dizziness, extremity weakness, gait problem, headaches, light-headedness, numbness, seizures and speech difficulty.  Hematological: Negative.  Negative for adenopathy. Does not bruise/bleed easily.  Psychiatric/Behavioral: Negative.  Negative for depression and sleep disturbance. The patient is not nervous/anxious.      VITALS:  Blood pressure (!) 165/87, pulse 71, temperature 98.5 F (36.9 C), temperature source Oral, resp. rate 18, height _0  (1.6 m), weight 248 lb 3.2 oz (112.6 kg), SpO2 98 %.  Wt Readings from Last 3 Encounters:  01/20/22 248 lb 3.2 oz (112.6 kg)  10/13/21 245 lb (111.1 kg)  09/29/21 250 lb (113.4 kg)    Body mass index is 43.97 kg/m.  Performance status (ECOG): 1 - Symptomatic but completely ambulatory  PHYSICAL EXAM:  Physical Exam Constitutional:      General: She is not in acute distress.    Appearance: Normal appearance. She is normal weight.  HENT:     Head: Normocephalic and atraumatic.  Eyes:     General: No scleral icterus.    Extraocular Movements: Extraocular movements intact.     Conjunctiva/sclera: Conjunctivae normal.     Pupils: Pupils are equal, round, and reactive to light.  Cardiovascular:     Rate and Rhythm: Normal rate and regular rhythm.     Pulses: Normal pulses.     Heart sounds: Normal heart sounds. No murmur heard.    No friction rub. No gallop.  Pulmonary:     Effort: Pulmonary effort is normal. No respiratory distress.     Breath sounds: Normal breath sounds.  Chest:  Breasts:    Right: Normal.     Left: Normal.     Comments: No masses in either breast Mild radiation change of left breast Well healed left axillary scar. Abdominal:     General: Bowel sounds are normal. There is no distension.     Palpations: Abdomen is soft. There is no hepatomegaly, splenomegaly  or mass.     Tenderness: There is no abdominal tenderness.  Musculoskeletal:        General: Normal range of motion.     Cervical back: Normal range of motion and neck supple.     Right lower leg: No edema.     Left lower leg: No edema.  Lymphadenopathy:     Cervical: No cervical adenopathy.     Right cervical: No superficial, deep or posterior cervical adenopathy.    Left cervical: No superficial, deep or posterior cervical adenopathy.     Upper Body:     Right  upper body: No supraclavicular, axillary or pectoral adenopathy.     Left upper body: No supraclavicular, axillary or pectoral adenopathy.  Skin:    General: Skin is warm and dry.  Neurological:     General: No focal deficit present.     Mental Status: She is alert and oriented to person, place, and time. Mental status is at baseline.  Psychiatric:        Mood and Affect: Mood normal.        Behavior: Behavior normal.        Thought Content: Thought content normal.        Judgment: Judgment normal.    LABS:      Latest Ref Rng & Units 01/20/2022    8:54 AM 08/21/2021    4:52 PM 07/27/2021   12:00 AM  CBC  WBC 4.0 - 10.5 K/uL 6.9  7.6  7.6      Hemoglobin 12.0 - 15.0 g/dL 13.0  12.9  12.8      Hematocrit 36.0 - 46.0 % 41.1  37.5  39      Platelets 150 - 400 K/uL 293  305  251         This result is from an external source.      Latest Ref Rng & Units 01/20/2022    8:54 AM 10/13/2021   11:17 AM 08/21/2021    4:52 PM  CMP  Glucose 70 - 99 mg/dL 97  113  97   BUN 6 - 20 mg/dL _0 Creatinine 0.44 - 1.00 mg/dL 1.10  1.12  1.03   Sodium 135 - 145 mmol/L 141  138  142   Potassium 3.5 - 5.1 mmol/L 3.8  3.7  4.2   Chloride 98 - 111 mmol/L 107  102  106   CO2 22 - 32 mmol/L _1 Calcium 8.9 - 10.3 mg/dL 9.9  9.9  9.8   Total Protein 6.5 - 8.1 g/dL 7.6  7.5    Total Bilirubin 0.3 - 1.2 mg/dL 0.8  0.5    Alkaline Phos 38 - 126 U/L 61  81    AST 15 - 41 U/L 20  20    ALT 0 - 44 U/L 18  17       STUDIES:  Bilateral diagnostic mammogram was performed on July 20, 2021 and does not reveal any concerning abnormalities.  It was recommended that she could never go to screening mammograms.  Her breasts are heterogeneously dense.  She will need a repeat in 1 year.   HISTORY:   Allergies:  Allergies  Allergen Reactions   Sulfa Antibiotics Hives    Current Medications: Current Outpatient Medications  Medication Sig Dispense Refill   acetaminophen (TYLENOL) 500 MG tablet Take 500 mg by mouth every 6 (six) hours as needed for moderate pain or mild pain (pain.).     Artificial Tear Solution (GENTEAL TEARS) 0.1-0.2-0.3 % SOLN Place 1 drop into both eyes at bedtime.     aspirin EC 81 MG tablet Take 81 mg by mouth daily. Swallow whole.     atorvastatin (LIPITOR) 20 MG tablet Take 1 tablet (20 mg total) by mouth daily. 90 tablet 2   cetirizine (ZYRTEC) 10 MG tablet Take 10 mg by mouth daily as needed for allergies.     clonazePAM (KLONOPIN) 1 MG tablet Take 1 mg by mouth 2 (two) times daily.     cyanocobalamin (,VITAMIN B-12,)  1000 MCG/ML injection Inject 1,000 mcg into the muscle every 30 (thirty) days. (Patient not taking: Reported on 10/13/2021)     doxycycline (VIBRAMYCIN) 100 MG capsule Take 100 mg by mouth 2 (two) times daily. (Patient not taking: Reported on 10/13/2021)     DULoxetine (CYMBALTA) 30 MG capsule Take 30 mg by mouth in the morning.     famotidine (PEPCID) 40 MG tablet Take 40 mg by mouth in the morning.     folic acid (FOLVITE) 1 MG tablet Take 1 mg by mouth in the morning.     gabapentin (NEURONTIN) 600 MG tablet Take 600 mg by mouth 2 (two) times daily.     hydrOXYzine (ATARAX/VISTARIL) 25 MG tablet Take 25 mg by mouth 4 (four) times daily as needed for itching or rash.     levothyroxine (SYNTHROID) 75 MCG tablet Take 75 mcg by mouth daily.     loratadine (CLARITIN) 10 MG tablet Take 10 mg by mouth daily as needed for allergies.     nitroGLYCERIN (NITROSTAT) 0.4 MG SL  tablet Place 0.4 mg under the tongue every 5 (five) minutes as needed for chest pain.     ondansetron (ZOFRAN) 4 MG tablet Take 4 mg by mouth every 4 (four) hours as needed for nausea/vomiting.     Oxycodone HCl 10 MG TABS Take 10 mg by mouth in the morning, at noon, in the evening, and at bedtime.     sacubitril-valsartan (ENTRESTO) 24-26 MG Take 1 tablet by mouth 2 (two) times daily. 60 tablet 12   traZODone (DESYREL) 50 MG tablet Take 50 mg by mouth at bedtime.     venlafaxine XR (EFFEXOR-XR) 150 MG 24 hr capsule Take 150 mg by mouth in the morning.     Vitamin D, Ergocalciferol, (DRISDOL) 1.25 MG (50000 UNIT) CAPS capsule Take 50,000 Units by mouth every Monday.     No current facility-administered medications for this visit.   Facility-Administered Medications Ordered in Other Visits  Medication Dose Route Frequency Provider Last Rate Last Admin   sodium chloride flush (NS) 0.9 % injection 10 mL  10 mL Intracatheter PRN Mosher, Vida Roller A, PA-C   10 mL at 11/18/20 1123    I,Jasmine M Lassiter,acting as a Education administrator for Derwood Kaplan, MD.,have documented all relevant documentation on the behalf of Derwood Kaplan, MD,as directed by  Derwood Kaplan, MD while in the presence of Derwood Kaplan, MD.

## 2022-01-20 ENCOUNTER — Inpatient Hospital Stay: Payer: Medicare Other

## 2022-01-20 ENCOUNTER — Other Ambulatory Visit: Payer: Self-pay | Admitting: Oncology

## 2022-01-20 ENCOUNTER — Encounter: Payer: Self-pay | Admitting: Oncology

## 2022-01-20 ENCOUNTER — Inpatient Hospital Stay: Payer: Medicare Other | Attending: Oncology | Admitting: Oncology

## 2022-01-20 ENCOUNTER — Telehealth: Payer: Self-pay | Admitting: Oncology

## 2022-01-20 VITALS — BP 165/87 | HR 71 | Temp 98.5°F | Resp 18 | Ht 63.0 in | Wt 248.2 lb

## 2022-01-20 DIAGNOSIS — C50112 Malignant neoplasm of central portion of left female breast: Secondary | ICD-10-CM | POA: Diagnosis not present

## 2022-01-20 DIAGNOSIS — Z171 Estrogen receptor negative status [ER-]: Secondary | ICD-10-CM | POA: Diagnosis not present

## 2022-01-20 DIAGNOSIS — C50012 Malignant neoplasm of nipple and areola, left female breast: Secondary | ICD-10-CM

## 2022-01-20 DIAGNOSIS — C50912 Malignant neoplasm of unspecified site of left female breast: Secondary | ICD-10-CM | POA: Diagnosis not present

## 2022-01-20 LAB — CMP (CANCER CENTER ONLY)
ALT: 18 U/L (ref 0–44)
AST: 20 U/L (ref 15–41)
Albumin: 3.7 g/dL (ref 3.5–5.0)
Alkaline Phosphatase: 61 U/L (ref 38–126)
Anion gap: 7 (ref 5–15)
BUN: 15 mg/dL (ref 6–20)
CO2: 27 mmol/L (ref 22–32)
Calcium: 9.9 mg/dL (ref 8.9–10.3)
Chloride: 107 mmol/L (ref 98–111)
Creatinine: 1.1 mg/dL — ABNORMAL HIGH (ref 0.44–1.00)
GFR, Estimated: 58 mL/min — ABNORMAL LOW (ref >60)
Glucose, Bld: 97 mg/dL (ref 70–99)
Potassium: 3.8 mmol/L (ref 3.5–5.1)
Sodium: 141 mmol/L (ref 135–145)
Total Bilirubin: 0.8 mg/dL (ref 0.3–1.2)
Total Protein: 7.6 g/dL (ref 6.5–8.1)

## 2022-01-20 LAB — CBC WITH DIFFERENTIAL (CANCER CENTER ONLY)
Abs Immature Granulocytes: 0.02 10*3/uL (ref 0.00–0.07)
Basophils Absolute: 0 10*3/uL (ref 0.0–0.1)
Basophils Relative: 0 %
Eosinophils Absolute: 0.2 10*3/uL (ref 0.0–0.5)
Eosinophils Relative: 3 %
HCT: 41.1 % (ref 36.0–46.0)
Hemoglobin: 13 g/dL (ref 12.0–15.0)
Immature Granulocytes: 0 %
Lymphocytes Relative: 32 %
Lymphs Abs: 2.2 10*3/uL (ref 0.7–4.0)
MCH: 27.6 pg (ref 26.0–34.0)
MCHC: 31.6 g/dL (ref 30.0–36.0)
MCV: 87.3 fL (ref 80.0–100.0)
Monocytes Absolute: 0.4 10*3/uL (ref 0.1–1.0)
Monocytes Relative: 6 %
Neutro Abs: 4 10*3/uL (ref 1.7–7.7)
Neutrophils Relative %: 59 %
Platelet Count: 293 10*3/uL (ref 150–400)
RBC: 4.71 MIL/uL (ref 3.87–5.11)
RDW: 12.7 % (ref 11.5–15.5)
WBC Count: 6.9 10*3/uL (ref 4.0–10.5)
nRBC: 0 % (ref 0.0–0.2)

## 2022-01-20 NOTE — Telephone Encounter (Signed)
01/20/22 Next appt scheduled and confirmed with patient 

## 2022-01-28 ENCOUNTER — Telehealth: Payer: Self-pay

## 2022-01-28 NOTE — Telephone Encounter (Signed)
-----   Message from Derwood Kaplan, MD sent at 01/21/2022  7:18 PM EST ----- Regarding: call Tell her labs look good. Kidney function just slightly abn. Drink plenty of fluids

## 2022-01-28 NOTE — Telephone Encounter (Signed)
Patient notified of lab results and that she needs to drink plenty of fluids.

## 2022-02-21 ENCOUNTER — Encounter: Payer: Self-pay | Admitting: Hematology and Oncology

## 2022-02-24 ENCOUNTER — Encounter: Payer: Self-pay | Admitting: Hematology and Oncology

## 2022-06-26 ENCOUNTER — Encounter: Payer: Self-pay | Admitting: Hematology and Oncology

## 2022-07-22 ENCOUNTER — Encounter: Payer: Self-pay | Admitting: Hematology and Oncology

## 2022-07-26 ENCOUNTER — Ambulatory Visit: Payer: Medicaid Other | Admitting: Oncology

## 2022-07-26 ENCOUNTER — Other Ambulatory Visit: Payer: Medicaid Other

## 2022-07-27 NOTE — Progress Notes (Signed)
[facility]  [address] [place],  [place]  [address] [phone]  Clinic Day: 07/29/2022  Referring physician: Riesa Pope, *  ASSESSMENT & PLAN:  Assessment & Plan: 1. 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  No primary breast lesion was found.  She was treated with neoadjuvant docetaxel/carboplatin/trastuzumab, left axillary lymph node dissection and adjuvant radiation therapy to the left breast and axilla. She completed 1 year of maintenance trastuzumab in June 2022. She remains without evidence of malignancy.   2. Cardiomyopathy with decreased left ventricular ejection fraction.  This is being managed by Dr. Tomie China.  [name] from September revealed an ejection fraction of 39%, which was stable. Echocardiogram in November revealed an ejection fraction between 60-65%, which was a puzzle, as it has never been normal. Echocardiogram in March revealed an ejection fraction of 45-50%, which is closer to her baseline. She remains without symptoms of heart failure. [name] from April 2022 revealed stable left ventricular function, with an ejection fraction of 41%.   3. Bilateral hip and back pain, which is chronic.  MRI lumbar spine and left hip did not reveal any evidence of malignancy.  There were degenerative changes in the lumbar spine at L5-S1 several years ago. I am concerned about the possibility of spinal stenosis with her frequent falls. Unfortunately she will need to back off the NSAID's due to her elevated creatinine. She is having significant back pain at this time and is being followed with an orthopedic surgeon.  Plan She receives epidural injections every other month and states that this does help improve her pain temporarily. She had her annual screening bilateral mammogram done yesterday and the results are pending. Her labs today are pending. I will see her back on January 9th  with CBC and CMP. The patient understands the plans discussed today and is in agreement with them.  She knows to contact our office if she develops concerns prior to her next appointment.   I provided 18 minutes of face-to-face time during this this encounter and > 50% was spent counseling as documented under my assessment and plan.    Dellia Beckwith, MD  [facility] [address] [place] [address] Dept: [phone] Dept Fax: [phone]   No orders of the defined types were placed in this encounter.   CHIEF COMPLAINT:  CC: Stage IIA HER2 positive breast cancer  Current Treatment:  Observation  HISTORY OF PRESENT ILLNESS:  Stage IIA (T0 N1 M0) HER2 receptor positive left breast cancer diagnosed in April 2021.  The patient felt a mass in the left axilla in March.  Her last bilateral screening mammogram had been in November 2019 with a diagnostic left mammogram and ultrasound in December which revealed probable inframammary lymph node over the upper outer quadrant.  Six-month follow-up was recommended. Ultrasound in March 2021 revealed multiple hypoechoic lesions of the left axilla with the largest up to 4.6 cm in diameter with another measuring 3.3 cm and another 2.6 cm.  Bilateral diagnostic mammogram at [facility] March revealed at least 7 enlarged intramammary and axillary lymph nodes.  No primary breast lesion was found.  Targeted left breast ultrasound revealed an increase in the previously demonstrated 7 mm intramammary node at 1:30 o'clock measured 13 mm with diffuse cortical thickening.  There were multiple enlarged left axillary nodes with eccentric cortical thickening with a maximum  thickness of 19.8 mm.  Ultrasound-guided biopsy of the left axillary lymph node in April revealed metastatic carcinoma consistent with breast primary. Estrogen and progesterone receptors were negative and HER 2 positive.  Ki  67 was 40%.  Staging CT chest, abdomen and pelvis revealed bulky left axillary lymphadenopathy extending to the sub pectoralis nodal station with a 6 mm right upper paratracheal lymph node.  There was no evidence of metastatic disease in the lungs, abdomen or pelvis.  Breast MRI revealed a 1 x 1.2 oval enhancing mass within the posterior upper outer left breast felt to be an intraparenchymal lymph node, no breast masses were seen.  There was re-demonstration of the left axillary adenopathy.  On physical examination the left axillary adenopathy measured approximately 9 x 7 cm.  She had uterine ablation in 2011 for uterine fibroids, then experienced 1 day of uterine bleeding in 2012, but denies further bleeding.    We recommended neoadjuvant chemotherapy to include TCHP x 6 cycles with trastuzumab and pertuzumab for one full year.  Unfortunately, echocardiogram  In May revealed ejection fraction between 40 and 45%.  This was confirmed with [name] on May 10th, which revealed an ejection fraction of 42%.  Based on these findings, we changed the treatment regimen to docetaxel/carboplatin/trastuzumab [facility]) and eliminated pertuzumab, due to the potential cardiotoxicities from HER2 targeted therapies.  We also recommended repeat cardiac imaging every 6 weeks and instead of the standard every 12 weeks.  She was referred to Dr. Tomie China and he started her on Entresto and has been monitoring her cardiac function.  She reported some chest tightness to him, so he recommended a [name].  She had quit smoking.  She received her 1st cycle of docetaxel, carboplatin and trastuzumab in May.  She experienced toxicities of decreased appetite, minimal nausea and vomiting, and generalized arthralgias.  Her biggest complaint was generalized achiness and hip pain, felt to be most likely due to [name].  Her pain was not controlled with hydrocodone/APAP 5/325 or 10/325, so she was given oxycodone 5 mg 1-2 tablets every 6 hours as  needed for pain.  She was placed on esomeprazole 40 mg daily for acid reflux. She quit smoking in April 2021.   She continued Entresto daily.  She underwent nuclear stress test at Dr. Kem Parkinson office in June, which revealed a fairly stable ejection fraction of 39%.  She was also recommended for CT coronary angiography, which is to be scheduled at [facility].  She underwent a coronary/cardiac CT in July revealed moderate CAD in proximal LAD and mild CAD in mid left main coronary artery, CADRADS = 3, coronary calcium score is 168, which places the patient in the 41 percentile for age and dex matched control, normal coronary origin with right dominance and mildly dilated main pulmonary artery, 30 mm. [name] from August revealed a left ventricular ejection fraction of 39%, previously 42% in May, which was discussed with Dr. Tomie China.  We discussed the risks and benefits with the patient again.  Overall, we feel that the benefit outweighs any further risk, as long as she is closely monitored.  Bilateral breast MRI in September revealed a decrease in the left axillary lymphadenopathy.  However, there was a persistent, but smaller circumscribed oval mass in the upper outer quadrant of the left breast.  We discussed this with Dr. Rosalie Gums of breast radiology and we suspect this could represent the breast primary, but biopsy was negative.  [name] imaging in September revealed a left ventricular fraction  is 39%, which was stable.    Ultrasound guided left breast core needle biopsy in September revealed lymphoid tissue with reactive lymphoid hyperplasia and was negative for granulomas or malignancy.  No metastatic breast carcinoma could be seen.  She then underwent left axillary lymph node dissection on October 14th with Dr. Lequita Halt. Surgical pathology from this procedure found tumor present in 3 of 27 lymph nodes (3/27).  One lymph node was positive for macrometastasis measuring 4mm.  Two lymph nodes had micrometastasis.   There were no lymph nodes with isolated tumor cells and no extranodal tumor seen. Echocardiogram in November revealed a normal ejection fraction between 60-65% , which was a puzzle, as very different from her baseline.  MRI lumbar spine and left hip revealed mild-to-moderate facet arthropathy at L5-S1, which was worse on the right.  MRI left hip was negative.  She completed 1 year of trastuzumab in June 2022.  Echocardiogram in March revealed an ejection fraction of 45-50%, which although decreased from November, was closer to her baseline. We decided to continue with trastuzumab. Our pharmacist recommended a [name] in 6 weeks instead of echo. [name] scan on April 25th revealed a slight increase in the ejection fraction at 41%. She did complete 1 full year of trastuzumab and continues to be followed by Cardiology.  Oncology History  Malignant neoplasm of left breast (HCC)  06/01/2019 Cancer Staging   Staging form: Breast, AJCC 8th Edition - Clinical stage from 06/01/2019: cT0, cN1(f), cM0, ER-, PR-, HER2+ - Signed by Dellia Beckwith, MD on 12/31/2019   09/05/2019 Initial Diagnosis   Malignant neoplasm of left breast (HCC)   11/22/2019 Cancer Staging   Staging form: Breast, AJCC 8th Edition - Pathologic stage from 11/22/2019: No Stage Recommended (ypT0, pN1, cM0, ER-, PR-, HER2+) - Signed by Dellia Beckwith, MD on 12/31/2019 Prognostic indicators: 3/27 nodes pos., no primary found   07/16/2020 Imaging   Bilateral diagnostic mammogram:  FINDINGS:  Surgical changes are seen in the left axilla. There are changes of  radiation therapy in the left breast. No suspicious mass or  malignant type microcalcifications identified in either breast.  IMPRESSION:  No evidence of malignancy in either breast.  RECOMMENDATION:  Bilateral diagnostic mammogram in 1 year is recommended.    Malignant neoplasm of left female breast (HCC)  06/22/2019 - 08/01/2020 Chemotherapy   Patient is on Treatment Plan :  BREAST  Trastuzumab Maintenance q21d     11/23/2019 Initial Diagnosis   Malignant neoplasm of left female breast (HCC)   07/16/2020 Imaging   Bilateral diagnostic mammogram:  FINDINGS:  Surgical changes are seen in the left axilla. There are changes of  radiation therapy in the left breast. No suspicious mass or  malignant type microcalcifications identified in either breast.  IMPRESSION:  No evidence of malignancy in either breast.  RECOMMENDATION:  Bilateral diagnostic mammogram in 1 year is recommended.       INTERVAL HISTORY:  Brandi Weaver is here for routine follow up for stage IIA HER2 positive breast cancer diagnosed in April, 2021. Patient states that she feels ok and complains of back pain rating a 7/10. She receives epidural injections every other month and states that this does help improve her pain temporarily. She had her annual screening bilateral mammogram done yesterday and the results are pending. Her labs today are pending. I will see her back on January 9th with CBC and CMP. She denies signs of infection such as sore throat, sinus drainage, cough, or urinary symptoms.  She denies fevers or recurrent chills. She denies pain. She denies nausea, vomiting, chest pain, dyspnea or cough. Her appetite is good and her weight has decreased 3 pounds over last 7 months .  REVIEW OF SYSTEMS:  Review of Systems  Constitutional: Negative.  Negative for appetite change, chills, diaphoresis, fatigue, fever and unexpected weight change.  HENT:  Negative.  Negative for hearing loss, lump/mass, mouth sores, nosebleeds, sore throat, tinnitus, trouble swallowing and voice change.   Eyes: Negative.  Negative for eye problems and icterus.  Respiratory: Negative.  Negative for chest tightness, cough, hemoptysis, shortness of breath and wheezing.   Cardiovascular: Negative.  Negative for chest pain, leg swelling and palpitations.  Gastrointestinal: Negative.  Negative for abdominal distention,  abdominal pain, blood in stool, constipation, diarrhea, nausea, rectal pain and vomiting.  Endocrine: Negative.   Genitourinary: Negative.  Negative for bladder incontinence, difficulty urinating, dyspareunia, dysuria, frequency, hematuria, menstrual problem, nocturia, pelvic pain, vaginal bleeding and vaginal discharge.   Musculoskeletal:  Positive for back pain (7/10). Negative for arthralgias, flank pain, gait problem, myalgias, neck pain and neck stiffness.  Skin: Negative.  Negative for itching, rash and wound.  Neurological:  Negative for dizziness, extremity weakness, gait problem, headaches, light-headedness, numbness, seizures and speech difficulty.  Hematological: Negative.  Negative for adenopathy. Does not bruise/bleed easily.  Psychiatric/Behavioral: Negative.  Negative for confusion, decreased concentration, depression, sleep disturbance and suicidal ideas. The patient is not nervous/anxious.      VITALS:  Blood pressure (!) 143/72, pulse (!) 59, temperature 97.8 F (36.6 C), temperature source Oral, resp. rate 16, height 5\' 3"  (1.6 m), weight 245 lb 4.8 oz (111.3 kg), SpO2 98 %.  Wt Readings from Last 3 Encounters:  07/29/22 245 lb 4.8 oz (111.3 kg)  01/20/22 248 lb 3.2 oz (112.6 kg)  10/13/21 245 lb (111.1 kg)    Body mass index is 43.45 kg/m.  Performance status (ECOG): 1 - Symptomatic but completely ambulatory  PHYSICAL EXAM:  Physical Exam Vitals and nursing note reviewed.  Constitutional:      General: She is not in acute distress.    Appearance: Normal appearance. She is normal weight. She is not ill-appearing, toxic-appearing or diaphoretic.  HENT:     Head: Normocephalic and atraumatic.     Right Ear: Tympanic membrane, ear canal and external ear normal. There is no impacted cerumen.     Left Ear: Tympanic membrane, ear canal and external ear normal. There is no impacted cerumen.     Nose: Nose normal. No congestion or rhinorrhea.     Mouth/Throat:     Mouth:  Mucous membranes are moist.     Pharynx: Oropharynx is clear. No oropharyngeal exudate or posterior oropharyngeal erythema.  Eyes:     General: No scleral icterus.       Right eye: No discharge.        Left eye: No discharge.     Extraocular Movements: Extraocular movements intact.     Conjunctiva/sclera: Conjunctivae normal.     Pupils: Pupils are equal, round, and reactive to light.  Neck:     Vascular: No carotid bruit.  Cardiovascular:     Rate and Rhythm: Normal rate and regular rhythm.     Pulses: Normal pulses.     Heart sounds: Normal heart sounds. No murmur heard.    No friction rub. No gallop.  Pulmonary:     Effort: Pulmonary effort is normal. No respiratory distress.     Breath sounds: Normal breath sounds.  No stridor. No wheezing, rhonchi or rales.  Chest:     Chest wall: No tenderness.  Breasts:    Right: Normal.     Left: Normal.     Comments: No masses in either breast.  Firmness in the left breast.  Healed incision in the left breast.   Abdominal:     General: Bowel sounds are normal. There is no distension.     Palpations: Abdomen is soft. There is no hepatomegaly, splenomegaly or mass.     Tenderness: There is no abdominal tenderness. There is no right CVA tenderness, left CVA tenderness, guarding or rebound.     Hernia: No hernia is present.  Musculoskeletal:        General: No swelling, tenderness, deformity or signs of injury. Normal range of motion.     Cervical back: Normal range of motion and neck supple. No rigidity or tenderness.     Right lower leg: No edema.     Left lower leg: No edema.  Lymphadenopathy:     Cervical: No cervical adenopathy.     Right cervical: No superficial, deep or posterior cervical adenopathy.    Left cervical: No superficial, deep or posterior cervical adenopathy.     Upper Body:     Right upper body: No supraclavicular, axillary or pectoral adenopathy.     Left upper body: No supraclavicular, axillary or pectoral  adenopathy.  Skin:    General: Skin is warm and dry.     Coloration: Skin is not jaundiced or pale.     Findings: No bruising, erythema, lesion or rash.  Neurological:     General: No focal deficit present.     Mental Status: She is alert and oriented to person, place, and time. Mental status is at baseline.     Cranial Nerves: No cranial nerve deficit.     Sensory: No sensory deficit.     Motor: No weakness.     Coordination: Coordination normal.     Gait: Gait normal.     Deep Tendon Reflexes: Reflexes normal.  Psychiatric:        Mood and Affect: Mood normal.        Behavior: Behavior normal.        Thought Content: Thought content normal.        Judgment: Judgment normal.    LABS:   Component Ref Range & Units 04/01/2022  Sodium 135 - 145 mmol/L 140  Potassium 3.5 - 5.0 mmol/L 4.3  Chloride 98 - 107 mmol/L 107  CO2 22.0 - 32.0 mmol/L 26.0  Anion Gap 7 - 15 mmol/L 7  BUN 7 - 21 mg/dL 15  Creatinine 1.61 - 0.96 mg/dL 0.45 High   BUN/Creatinine Ratio 13  eGFR CKD-EPI (2021) Female >=60 mL/min/1.55m2 53 Low   Glucose 74 - 106 mg/dL 96  Calcium 8.5 - 40.9 mg/dL 81.1   Component Ref Range & Units 04/01/2022  PRO-BNP <300.0 pg/mL <20.0   Component Ref Range & Units 03/24/2022  TSH 0.600 - 3.300 uIU/mL 4.460 High       Latest Ref Rng & Units 07/29/2022    9:07 AM 01/20/2022    8:54 AM 08/21/2021    4:52 PM  CBC  WBC 4.0 - 10.5 K/uL 8.9  6.9  7.6   Hemoglobin 12.0 - 15.0 g/dL 91.4  78.2  95.6   Hematocrit 36.0 - 46.0 % 40.4  41.1  37.5   Platelets 150 - 400 K/uL 313  293  305  Latest Ref Rng & Units 07/29/2022    9:07 AM 01/20/2022    8:54 AM 10/13/2021   11:17 AM  CMP  Glucose 70 - 99 mg/dL 161  97  096   BUN 6 - 20 mg/dL 14  15  14    Creatinine 0.44 - 1.00 mg/dL 0.45  4.09  8.11   Sodium 135 - 145 mmol/L 139  141  138   Potassium 3.5 - 5.1 mmol/L 3.8  3.8  3.7   Chloride 98 - 111 mmol/L 109  107  102   CO2 22 - 32 mmol/L 21  27  16     Calcium 8.9 - 10.3 mg/dL 9.3  9.9  9.9   Total Protein 6.5 - 8.1 g/dL 7.6  7.6  7.5   Total Bilirubin 0.3 - 1.2 mg/dL 0.9  0.8  0.5   Alkaline Phos 38 - 126 U/L 68  61  81   AST 15 - 41 U/L 30  20  20    ALT 0 - 44 U/L 25  18  17      STUDIES:    HISTORY:   Allergies:  Allergies  Allergen Reactions   Sulfa Antibiotics Hives    Current Medications: Current Outpatient Medications  Medication Sig Dispense Refill   acetaminophen (TYLENOL) 500 MG tablet Take 500 mg by mouth every 6 (six) hours as needed for moderate pain or mild pain (pain.).     Artificial Tear Solution (GENTEAL TEARS) 0.1-0.2-0.3 % SOLN Place 1 drop into both eyes at bedtime.     aspirin EC 81 MG tablet Take 81 mg by mouth daily. Swallow whole.     atorvastatin (LIPITOR) 20 MG tablet Take 1 tablet (20 mg total) by mouth daily. 90 tablet 2   cetirizine (ZYRTEC) 10 MG tablet Take 10 mg by mouth daily as needed for allergies.     clonazePAM (KLONOPIN) 1 MG tablet Take 1 mg by mouth 2 (two) times daily.     cyanocobalamin (,VITAMIN B-12,) 1000 MCG/ML injection Inject 1,000 mcg into the muscle every 30 (thirty) days. (Patient not taking: Reported on 10/13/2021)     doxycycline (VIBRAMYCIN) 100 MG capsule Take 100 mg by mouth 2 (two) times daily. (Patient not taking: Reported on 10/13/2021)     DULoxetine (CYMBALTA) 30 MG capsule Take 30 mg by mouth in the morning.     famotidine (PEPCID) 40 MG tablet Take 40 mg by mouth in the morning.     folic acid (FOLVITE) 1 MG tablet Take 1 mg by mouth in the morning.     gabapentin (NEURONTIN) 600 MG tablet Take 600 mg by mouth 2 (two) times daily.     hydrOXYzine (ATARAX/VISTARIL) 25 MG tablet Take 25 mg by mouth 4 (four) times daily as needed for itching or rash.     levothyroxine (SYNTHROID) 75 MCG tablet Take 75 mcg by mouth daily.     loratadine (CLARITIN) 10 MG tablet Take 10 mg by mouth daily as needed for allergies.     nitroGLYCERIN (NITROSTAT) 0.4 MG SL tablet Place 0.4 mg  under the tongue every 5 (five) minutes as needed for chest pain.     ondansetron (ZOFRAN) 4 MG tablet Take 4 mg by mouth every 4 (four) hours as needed for nausea/vomiting.     Oxycodone HCl 10 MG TABS Take 10 mg by mouth in the morning, at noon, in the evening, and at bedtime.     sacubitril-valsartan (ENTRESTO) 24-26 MG Take 1 tablet by mouth  2 (two) times daily. 60 tablet 12   traZODone (DESYREL) 50 MG tablet Take 50 mg by mouth at bedtime.     venlafaxine XR (EFFEXOR-XR) 150 MG 24 hr capsule Take 150 mg by mouth in the morning.     Vitamin D, Ergocalciferol, (DRISDOL) 1.25 MG (50000 UNIT) CAPS capsule Take 50,000 Units by mouth every Monday.     No current facility-administered medications for this visit.   Facility-Administered Medications Ordered in Other Visits  Medication Dose Route Frequency Provider Last Rate Last Admin   sodium chloride flush (NS) 0.9 % injection 10 mL  10 mL Intracatheter PRN Mosher, Harvin Hazel A, PA-C   10 mL at 11/18/20 1123    I,Jasmine M Lassiter,acting as a Neurosurgeon for Dellia Beckwith, MD.,have documented all relevant documentation on the behalf of Dellia Beckwith, MD,as directed by  Dellia Beckwith, MD while in the presence of Dellia Beckwith, MD.

## 2022-07-28 LAB — HM MAMMOGRAPHY

## 2022-07-29 ENCOUNTER — Other Ambulatory Visit: Payer: Self-pay | Admitting: Oncology

## 2022-07-29 ENCOUNTER — Inpatient Hospital Stay: Payer: 59 | Attending: Oncology

## 2022-07-29 ENCOUNTER — Telehealth: Payer: Self-pay | Admitting: Oncology

## 2022-07-29 ENCOUNTER — Encounter: Payer: Self-pay | Admitting: Oncology

## 2022-07-29 ENCOUNTER — Inpatient Hospital Stay (INDEPENDENT_AMBULATORY_CARE_PROVIDER_SITE_OTHER): Payer: 59 | Admitting: Oncology

## 2022-07-29 VITALS — BP 143/72 | HR 59 | Temp 97.8°F | Resp 16 | Ht 63.0 in | Wt 245.3 lb

## 2022-07-29 DIAGNOSIS — M25552 Pain in left hip: Secondary | ICD-10-CM | POA: Insufficient documentation

## 2022-07-29 DIAGNOSIS — Z853 Personal history of malignant neoplasm of breast: Secondary | ICD-10-CM | POA: Diagnosis present

## 2022-07-29 DIAGNOSIS — Z171 Estrogen receptor negative status [ER-]: Secondary | ICD-10-CM

## 2022-07-29 DIAGNOSIS — C50112 Malignant neoplasm of central portion of left female breast: Secondary | ICD-10-CM

## 2022-07-29 DIAGNOSIS — M549 Dorsalgia, unspecified: Secondary | ICD-10-CM | POA: Insufficient documentation

## 2022-07-29 DIAGNOSIS — Z87891 Personal history of nicotine dependence: Secondary | ICD-10-CM | POA: Insufficient documentation

## 2022-07-29 DIAGNOSIS — C50012 Malignant neoplasm of nipple and areola, left female breast: Secondary | ICD-10-CM

## 2022-07-29 DIAGNOSIS — I429 Cardiomyopathy, unspecified: Secondary | ICD-10-CM | POA: Insufficient documentation

## 2022-07-29 DIAGNOSIS — M25551 Pain in right hip: Secondary | ICD-10-CM | POA: Insufficient documentation

## 2022-07-29 DIAGNOSIS — G8929 Other chronic pain: Secondary | ICD-10-CM | POA: Diagnosis not present

## 2022-07-29 LAB — CBC WITH DIFFERENTIAL (CANCER CENTER ONLY)
Abs Immature Granulocytes: 0.02 10*3/uL (ref 0.00–0.07)
Basophils Absolute: 0 10*3/uL (ref 0.0–0.1)
Basophils Relative: 0 %
Eosinophils Absolute: 0.2 10*3/uL (ref 0.0–0.5)
Eosinophils Relative: 2 %
HCT: 40.4 % (ref 36.0–46.0)
Hemoglobin: 13 g/dL (ref 12.0–15.0)
Immature Granulocytes: 0 %
Lymphocytes Relative: 30 %
Lymphs Abs: 2.7 10*3/uL (ref 0.7–4.0)
MCH: 28.1 pg (ref 26.0–34.0)
MCHC: 32.2 g/dL (ref 30.0–36.0)
MCV: 87.3 fL (ref 80.0–100.0)
Monocytes Absolute: 0.5 10*3/uL (ref 0.1–1.0)
Monocytes Relative: 6 %
Neutro Abs: 5.5 10*3/uL (ref 1.7–7.7)
Neutrophils Relative %: 62 %
Platelet Count: 313 10*3/uL (ref 150–400)
RBC: 4.63 MIL/uL (ref 3.87–5.11)
RDW: 13 % (ref 11.5–15.5)
WBC Count: 8.9 10*3/uL (ref 4.0–10.5)
nRBC: 0 % (ref 0.0–0.2)

## 2022-07-29 LAB — CMP (CANCER CENTER ONLY)
ALT: 25 U/L (ref 0–44)
AST: 30 U/L (ref 15–41)
Albumin: 3.8 g/dL (ref 3.5–5.0)
Alkaline Phosphatase: 68 U/L (ref 38–126)
Anion gap: 9 (ref 5–15)
BUN: 14 mg/dL (ref 6–20)
CO2: 21 mmol/L — ABNORMAL LOW (ref 22–32)
Calcium: 9.3 mg/dL (ref 8.9–10.3)
Chloride: 109 mmol/L (ref 98–111)
Creatinine: 1.05 mg/dL — ABNORMAL HIGH (ref 0.44–1.00)
GFR, Estimated: >60 mL/min (ref >60)
Glucose, Bld: 119 mg/dL — ABNORMAL HIGH (ref 70–99)
Potassium: 3.8 mmol/L (ref 3.5–5.1)
Sodium: 139 mmol/L (ref 135–145)
Total Bilirubin: 0.9 mg/dL (ref 0.3–1.2)
Total Protein: 7.6 g/dL (ref 6.5–8.1)

## 2022-07-29 NOTE — Telephone Encounter (Signed)
07/29/22 Spoke with patient and scheduled next appt

## 2022-08-06 ENCOUNTER — Telehealth: Payer: Self-pay

## 2022-08-06 ENCOUNTER — Encounter: Payer: Self-pay | Admitting: Hematology and Oncology

## 2022-08-06 NOTE — Telephone Encounter (Signed)
-----   Message from Dellia Beckwith, MD sent at 07/30/2022 12:18 PM EDT ----- Regarding: call Tell her labs look good

## 2022-08-06 NOTE — Telephone Encounter (Signed)
-----   Message from Dellia Beckwith, MD sent at 08/06/2022  9:25 AM EDT ----- Regarding: mammo Mammo done 6/19, have we seen report yet?

## 2022-08-06 NOTE — Telephone Encounter (Signed)
Attempted to contact patient. No answer. 

## 2022-08-06 NOTE — Telephone Encounter (Signed)
Printed and placed in Dr. Gilman Buttner box for her review

## 2022-10-04 ENCOUNTER — Other Ambulatory Visit: Payer: Self-pay | Admitting: Cardiology

## 2022-12-21 ENCOUNTER — Encounter: Payer: Self-pay | Admitting: Hematology and Oncology

## 2022-12-26 ENCOUNTER — Other Ambulatory Visit: Payer: Self-pay | Admitting: Cardiology

## 2022-12-27 NOTE — Progress Notes (Deleted)
Cardiology Office Note:  .   Date:  12/27/2022  ID:  Brandi Weaver, DOB Mar 27, 1965, MRN [id number] PCP: Riesa Pope, MD  [facility] Providers Cardiologist:  None { Click to update primary MD,subspecialty MD or APP then REFRESH:1}   History of Present Illness: .   Brandi Weaver is a 57 y.o. female with a past medical history of hypertension, HFrEF, nonobstructive CAD, breast cancer, tobacco abuse.  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 Tomie China on 09/29/2021, she was started on Entresto, advised to keep a blood pressure log and advised to follow-up in 2 weeks.  Followed laterally by Lorretta Harp, MD in [facility]--   ROS: ROS   Studies Reviewed: .        Cardiac Studies & Procedures   CARDIAC CATHETERIZATION  CARDIAC CATHETERIZATION 08/27/2021  Narrative   There is mild left ventricular systolic dysfunction.   LV end diastolic pressure is mildly elevated.   The left ventricular ejection fraction is 45-50% by visual estimate.   There is no aortic valve stenosis.   Minimal nonobstructive coronary artery disease.   Right subclavian tortuosity makes torquing catheters difficult for the RCA.  No significant CAD.  Continue preventive therapy.  Findings Coronary Findings Diagnostic  Dominance: Right  Left Circumflex The vessel exhibits minimal luminal irregularities.  Right Coronary  Artery Vessel is small. Vessel is angiographically normal.  Intervention  No interventions have been documented.   STRESS TESTS  MYOCARDIAL PERFUSION IMAGING 07/10/2019  Narrative  The left ventricular ejection fraction is moderately decreased (30-44%).  Nuclear stress EF: 39%.  There was no ST segment deviation noted during stress.  Defect 1: There is a medium defect of moderate severity.  Findings consistent with prior myocardial infarction.  This is an intermediate risk study.  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  Diminished EF.   ECHOCARDIOGRAM  ECHOCARDIOGRAM COMPLETE 04/28/2020  Narrative ECHOCARDIOGRAM REPORT    Patient Name:   Brandi Weaver Date of Exam: 04/28/2020 Medical Rec #:  [id number]         Height:       63.0 in Accession #:    [id number]        Weight:       225.2 lb Date of Birth:  [birth year]         BSA:          2.034 m Patient Age:    54 years          BP:           147/73 mmHg Patient Gender: F                 HR:           61 bpm. Exam Location:  [place]  Procedure: 2D Echo  Indications:    Malignant neoplasm of left breast in female, estrogen receptor negative, unspecified site of breast (HCC) [C50.912, Z17.1]  History:  Patient has prior history of Echocardiogram examinations, most recent 12/13/2019. Cardiomyopathy, CAD, Malignant neoplasm of left female breast, Signs/Symptoms:Chest Pain and Morbid (severe) obesity due to excess calories; Risk Factors:Hypertension and Tobacco use disorder.  Sonographer:    Louie Boston Referring Phys: [id number] CHRISTINE H MCCARTY  IMPRESSIONS   1. GLS: -7.8. Left ventricular ejection fraction, by estimation, is 45 to 50%. The left ventricle has mildly decreased function. The left ventricle has no regional wall motion abnormalities. The left ventricular internal cavity size was mildly to moderately dilated. Left ventricular diastolic parameters were normal. 2. Right ventricular systolic  function is normal. The right ventricular size is normal. There is normal pulmonary artery systolic pressure. 3. The mitral valve is normal in structure. No evidence of mitral valve regurgitation. No evidence of mitral stenosis. 4. The aortic valve is normal in structure. Aortic valve regurgitation is not visualized. No aortic stenosis is present. 5. The inferior vena cava is normal in size with greater than 50% respiratory variability, suggesting right atrial pressure of 3 mmHg.  FINDINGS Left Ventricle: GLS: -7.8. Left ventricular ejection fraction, by estimation, is 45 to 50%. The left ventricle has mildly decreased function. The left ventricle has no regional wall motion abnormalities. The left ventricular internal cavity size was mildly to moderately dilated. There is no left ventricular hypertrophy. Left ventricular diastolic parameters were normal.  Right Ventricle: The right ventricular size is normal. No increase in right ventricular wall thickness. Right ventricular systolic function is normal. There is normal pulmonary artery systolic pressure. The tricuspid regurgitant velocity is 2.28 m/s, and with an assumed right atrial pressure of 3 mmHg, the estimated right ventricular systolic pressure is 23.8 mmHg.  Left Atrium: Left atrial size was normal in size.  Right Atrium: Right atrial size was normal in size.  Pericardium: There is no evidence of pericardial effusion.  Mitral Valve: The mitral valve is normal in structure. No evidence of mitral valve regurgitation. No evidence of mitral valve stenosis.  Tricuspid Valve: The tricuspid valve is normal in structure. Tricuspid valve regurgitation is mild . No evidence of tricuspid stenosis.  Aortic Valve: The aortic valve is normal in structure. Aortic valve regurgitation is not visualized. No aortic stenosis is present.  Pulmonic Valve: The pulmonic valve was normal in structure. Pulmonic valve regurgitation is not visualized. No  evidence of pulmonic stenosis.  Aorta: The aortic root is normal in size and structure.  Venous: The inferior vena cava is normal in size with greater than 50% respiratory variability, suggesting right atrial pressure of 3 mmHg.  IAS/Shunts: No atrial level shunt detected by color flow Doppler.   LEFT VENTRICLE PLAX 2D LVIDd:         5.70 cm     Diastology LVIDs:         4.80 cm     LV e' medial:    6.42 cm/s LV PW:         1.10 cm     LV E/e' medial:  10.1 LV IVS:        1.10 cm     LV e' lateral:   7.51 cm/s LVOT diam:     2.10 cm     LV E/e' lateral: 8.6 LV SV:         67 LV SV Index:   33 LVOT Area:     3.46 cm  LV Volumes (MOD) LV vol d, MOD A2C: 87.0 ml LV vol d, MOD A4C: 87.2 ml LV vol s, MOD A2C:  37.7 ml LV vol s, MOD A4C: 43.7 ml LV SV MOD A2C:     49.3 ml LV SV MOD A4C:     87.2 ml LV SV MOD BP:      47.1 ml  RIGHT VENTRICLE             IVC RV S prime:     11.90 cm/s  IVC diam: 1.50 cm TAPSE (M-mode): 2.4 cm  LEFT ATRIUM             Index       RIGHT ATRIUM           Index LA diam:        4.00 cm 1.97 cm/m  RA Area:     11.80 cm LA Vol (A2C):   59.3 ml 29.16 ml/m RA Volume:   28.10 ml  13.82 ml/m LA Vol (A4C):   44.7 ml 21.98 ml/m LA Biplane Vol: 51.9 ml 25.52 ml/m AORTIC VALVE LVOT Vmax:   83.37 cm/s LVOT Vmean:  59.300 cm/s LVOT VTI:    0.194 m  AORTA Ao Root diam: 3.10 cm Ao Desc diam: 2.00 cm  MITRAL VALVE               TRICUSPID VALVE MV Area (PHT): 4.06 cm    TR Peak grad:   20.8 mmHg MV Decel Time: 187 msec    TR Vmax:        228.00 cm/s MV E velocity: 64.70 cm/s MV A velocity: 70.30 cm/s  SHUNTS MV E/A ratio:  0.92        Systemic VTI:  0.19 m Systemic Diam: 2.10 cm  Gypsy Balsam MD Electronically signed by Gypsy Balsam MD Signature Date/Time: 04/28/2020/12:07:24 PM    Final     CT SCANS  CT CORONARY MORPH W/CTA COR W/SCORE 08/16/2019  Addendum 08/17/2019  3:03 PM ADDENDUM REPORT: 08/17/2019  14:58  HISTORY: Cardiomyopathy, undefined, further testing abnormal lexiscan  EXAM: Cardiac/Coronary  CT  TECHNIQUE: The patient was scanned on a [employer].  PROTOCOL: A 120 kV prospective scan was triggered in the descending thoracic aorta at 111 HU's. Axial non-contrast 3 mm slices were carried out through the heart. The data set was analyzed on a dedicated work station and scored using the Agatson method. Gantry rotation speed was 250 msecs and collimation was .6 mm. Beta blockade and 0.8 mg of sl NTG was given. The 3D data set was reconstructed in 5% intervals of the 67-82 % of the R-R cycle. Diastolic phases were analyzed on a dedicated work station using MPR, MIP and VRT modes. The patient received 80mL OMNIPAQUE IOHEXOL 350 MG/ML SOLN of contrast.  FINDINGS: Coronary calcium score is 168, which places the patient in the 28 percentile for age and sex matched control.  Coronary arteries: Normal coronary origins.  Right dominance.  Right Coronary Artery: No detectable plaque or stenosis.  Left Main Coronary Artery: Mild mixed atherosclerotic plaque in the mid left main coronary artery, 25-49% stenosis.  Left Anterior Descending Coronary Artery: Moderate mixed atherosclerotic plaque in the proximal LAD, 50-69% stenosis, with a serial mild mixed atherosclerotic plaque, 25-49% stenosis.  Left Circumflex Artery: Minimal atherosclerotic plaque in the proximal left circumflex, <25% stenosis.  Aorta: Normal size, 29 mm at the mid ascending aorta (level of the PA bifurcation) measured double oblique. Moderate calcifications in descending thoracic . No dissection.  Aortic Valve: No calcifications.  Other findings:  Normal pulmonary vein drainage into the left atrium.  Normal  left atrial appendage without a thrombus.  Mildly dilated main pulmonary artery, 30 mm.  IMPRESSION: 1. Moderate CAD in proximal LAD and mild CAD in mid left main coronary artery,  CADRADS = 3. CT FFR will be performed and reported separately.  2. Coronary calcium score is 168, which places the patient in the 66 percentile for age and sex matched control.  3. Normal coronary origin with right dominance.  4. Mildly dilated main pulmonary artery, 30 mm.   Electronically Signed By: Weston Brass On: 08/17/2019 14:58  Narrative EXAM: OVER-READ INTERPRETATION  CT CHEST  The following report is an over-read performed by radiologist Dr. Trudie Reed of [facility] on 08/16/2019. This over-read does not include interpretation of cardiac or coronary anatomy or pathology. The coronary calcium score/coronary CTA interpretation by the cardiologist is attached.  COMPARISON:  None.  FINDINGS: Tip of central venous catheter in the distal superior vena cava. Aortic atherosclerosis. Within the visualized portions of the thorax there are no suspicious appearing pulmonary nodules or masses, there is no acute consolidative airspace disease, no pleural effusions, no pneumothorax and no lymphadenopathy. Visualized portions of the upper abdomen are unremarkable. There are no aggressive appearing lytic or blastic lesions noted in the visualized portions of the skeleton.  IMPRESSION: 1.  Aortic Atherosclerosis (ICD10-I70.0).  Electronically Signed: By: Trudie Reed M.D. On: 08/16/2019 10:50          Risk Assessment/Calculations:   {Does this patient have ATRIAL FIBRILLATION?:[phone]} No BP recorded.  {Refresh Note OR Click here to enter BP  :1}***       Physical Exam:   VS:  There were no vitals taken for this visit.   Wt Readings from Last 3 Encounters:  07/29/22 245 lb 4.8 oz (111.3 kg)  01/20/22 248 lb 3.2 oz (112.6 kg)  10/13/21 245 lb (111.1 kg)    GEN: Well nourished, well developed in no acute distress NECK: No JVD; No carotid bruits CARDIAC: ***RRR, no murmurs, rubs, gallops RESPIRATORY:  Clear to auscultation without rales,  wheezing or rhonchi  ABDOMEN: Soft, non-tender, non-distended EXTREMITIES:  No edema; No deformity   ASSESSMENT AND PLAN: .   ***    {Are you ordering a CV Procedure (e.g. stress test, cath, DCCV, TEE, etc)?   Press F2        :[id number]}  Dispo: ***  Signed, Flossie Dibble, NP

## 2022-12-28 ENCOUNTER — Ambulatory Visit: Payer: 59 | Attending: Cardiology | Admitting: Cardiology

## 2022-12-28 DIAGNOSIS — I2510 Atherosclerotic heart disease of native coronary artery without angina pectoris: Secondary | ICD-10-CM

## 2022-12-28 DIAGNOSIS — I10 Essential (primary) hypertension: Secondary | ICD-10-CM

## 2022-12-28 DIAGNOSIS — I429 Cardiomyopathy, unspecified: Secondary | ICD-10-CM

## 2023-01-21 ENCOUNTER — Encounter: Payer: Self-pay | Admitting: Cardiology

## 2023-01-21 NOTE — Progress Notes (Deleted)
 Cardiology Office Note:  .   Date:  01/21/2023  ID:  Brandi Weaver, DOB 1965/10/31, MRN [id number] PCP: Riesa Pope, MD  [facility] Providers Cardiologist:  None { Click to update primary MD,subspecialty MD or APP then REFRESH:1}   History of Present Illness: .   Brandi Weaver is a 57 y.o. female with a past medical history of hypertension, HFrEF, nonobstructive CAD, breast cancer, tobacco abuse.  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 Tomie China on 09/29/2021, she was started on Entresto, advised to keep a blood pressure log and advised to follow-up in 2 weeks.  Followed laterally by Lorretta Harp, MD in [place]--at this time she reported feeling well, continue to work at [employer], walking daily for exercise.  ROS: ROS   Studies Reviewed: .        Cardiac Studies & Procedures   CARDIAC CATHETERIZATION  CARDIAC CATHETERIZATION 08/27/2021  Narrative   There is mild left ventricular systolic dysfunction.   LV end diastolic pressure is mildly elevated.   The left ventricular ejection fraction is 45-50% by visual estimate.   There is no aortic valve stenosis.   Minimal nonobstructive coronary artery disease.   Right subclavian tortuosity makes torquing catheters difficult for the RCA.  No significant CAD.  Continue preventive therapy.  Findings Coronary Findings Diagnostic  Dominance:  Right  Left Circumflex The vessel exhibits minimal luminal irregularities.  Right Coronary Artery Vessel is small. Vessel is angiographically normal.  Intervention  No interventions have been documented.   STRESS TESTS  MYOCARDIAL PERFUSION IMAGING 07/10/2019  Narrative  The left ventricular ejection fraction is moderately decreased (30-44%).  Nuclear stress EF: 39%.  There was no ST segment deviation noted during stress.  Defect 1: There is a medium defect of moderate severity.  Findings consistent with prior myocardial infarction.  This is an intermediate risk study.  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  Diminished EF.  ECHOCARDIOGRAM  ECHOCARDIOGRAM COMPLETE 04/28/2020  Narrative ECHOCARDIOGRAM REPORT    Patient Name:   Brandi Weaver Date of Exam: 04/28/2020 Medical Rec #:  [id number]         Height:       63.0 in Accession #:    [id number]        Weight:       225.2 lb Date of Birth:  July 31, 1965         BSA:          2.034 m Patient Age:    54 years          BP:           147/73 mmHg Patient Gender: F                 HR:           61 bpm. Exam Location:  [place] [facility]  Procedure: 2D Echo  Indications:    Malignant neoplasm of left breast in female, estrogen receptor  negative, unspecified site of breast (HCC) [C50.912, Z17.1]  History:        Patient has prior history of Echocardiogram examinations, most recent 12/13/2019. Cardiomyopathy, CAD, Malignant neoplasm of left female breast, Signs/Symptoms:Chest Pain and Morbid (severe) obesity due to excess calories; Risk Factors:Hypertension and Tobacco use disorder.  Sonographer:    Louie Boston Referring Phys: [id number] CHRISTINE H MCCARTY  IMPRESSIONS   1. GLS: -7.8. Left ventricular ejection fraction, by estimation, is 45 to 50%. The left ventricle has mildly decreased function. The left ventricle has no regional wall motion abnormalities. The left ventricular internal cavity size was mildly to moderately  dilated. Left ventricular diastolic parameters were normal. 2. Right ventricular systolic function is normal. The right ventricular size is normal. There is normal pulmonary artery systolic pressure. 3. The mitral valve is normal in structure. No evidence of mitral valve regurgitation. No evidence of mitral stenosis. 4. The aortic valve is normal in structure. Aortic valve regurgitation is not visualized. No aortic stenosis is present. 5. The inferior vena cava is normal in size with greater than 50% respiratory variability, suggesting right atrial pressure of 3 mmHg.  FINDINGS Left Ventricle: GLS: -7.8. Left ventricular ejection fraction, by estimation, is 45 to 50%. The left ventricle has mildly decreased function. The left ventricle has no regional wall motion abnormalities. The left ventricular internal cavity size was mildly to moderately dilated. There is no left ventricular hypertrophy. Left ventricular diastolic parameters were normal.  Right Ventricle: The right ventricular size is normal. No increase in right ventricular wall thickness. Right ventricular systolic function is normal. There is normal pulmonary artery systolic pressure. The tricuspid regurgitant velocity is 2.28 m/s, and with an assumed right atrial pressure of 3 mmHg, the estimated right ventricular systolic pressure is 23.8 mmHg.  Left Atrium: Left atrial size was normal in size.  Right Atrium: Right atrial size was normal in size.  Pericardium: There is no evidence of pericardial effusion.  Mitral Valve: The mitral valve is normal in structure. No evidence of mitral valve regurgitation. No evidence of mitral valve stenosis.  Tricuspid Valve: The tricuspid valve is normal in structure. Tricuspid valve regurgitation is mild . No evidence of tricuspid stenosis.  Aortic Valve: The aortic valve is normal in structure. Aortic valve regurgitation is not visualized. No aortic stenosis is present.  Pulmonic Valve: The  pulmonic valve was normal in structure. Pulmonic valve regurgitation is not visualized. No evidence of pulmonic stenosis.  Aorta: The aortic root is normal in size and structure.  Venous: The inferior vena cava is normal in size with greater than 50% respiratory variability, suggesting right atrial pressure of 3 mmHg.  IAS/Shunts: No atrial level shunt detected by color flow Doppler.   LEFT VENTRICLE PLAX 2D LVIDd:         5.70 cm     Diastology LVIDs:         4.80 cm     LV e' medial:    6.42 cm/s LV PW:         1.10 cm     LV E/e' medial:  10.1 LV IVS:        1.10 cm     LV e' lateral:   7.51 cm/s LVOT diam:     2.10 cm     LV E/e' lateral: 8.6 LV SV:         67 LV SV Index:   33 LVOT Area:     3.46 cm  LV Volumes (MOD) LV vol  d, MOD A2C: 87.0 ml LV vol d, MOD A4C: 87.2 ml LV vol s, MOD A2C: 37.7 ml LV vol s, MOD A4C: 43.7 ml LV SV MOD A2C:     49.3 ml LV SV MOD A4C:     87.2 ml LV SV MOD BP:      47.1 ml  RIGHT VENTRICLE             IVC RV S prime:     11.90 cm/s  IVC diam: 1.50 cm TAPSE (M-mode): 2.4 cm  LEFT ATRIUM             Index       RIGHT ATRIUM           Index LA diam:        4.00 cm 1.97 cm/m  RA Area:     11.80 cm LA Vol (A2C):   59.3 ml 29.16 ml/m RA Volume:   28.10 ml  13.82 ml/m LA Vol (A4C):   44.7 ml 21.98 ml/m LA Biplane Vol: 51.9 ml 25.52 ml/m AORTIC VALVE LVOT Vmax:   83.37 cm/s LVOT Vmean:  59.300 cm/s LVOT VTI:    0.194 m  AORTA Ao Root diam: 3.10 cm Ao Desc diam: 2.00 cm  MITRAL VALVE               TRICUSPID VALVE MV Area (PHT): 4.06 cm    TR Peak grad:   20.8 mmHg MV Decel Time: 187 msec    TR Vmax:        228.00 cm/s MV E velocity: 64.70 cm/s MV A velocity: 70.30 cm/s  SHUNTS MV E/A ratio:  0.92        Systemic VTI:  0.19 m Systemic Diam: 2.10 cm  Gypsy Balsam MD Electronically signed by Gypsy Balsam MD Signature Date/Time: 04/28/2020/12:07:24 PM    Final    CT SCANS  CT CORONARY FRACTIONAL FLOW RESERVE DATA  PREP 08/17/2019  Narrative EXAM: CT FFR ANALYSIS  CLINICAL DATA:  Cardiomyopathy, undefined, further testing  FINDINGS: FFRct analysis was performed on the original cardiac CT angiogram dataset. Diagrammatic representation of the FFRct analysis is provided in a separate PDF document in PACS. This dictation was created using the PDF document and an interactive 3D model of the results. 3D model is not available in the EMR/PACS. Normal FFR range is >0.80. Indeterminate (grey) zone is 0.76-0.80.  1. Left Main: FFR = 0.97  2. LAD: Proximal FFR = 0.94, Mid FFR = 0.85, Distal FFR = 0.73 3. LCX: Proximal FFR = 0.93, Distal FFR = 0.86 4. RCA: Proximal FFR = 0.95, Mid FFR =0.89, Distal FFR = 0.80  IMPRESSION: 1. CT FFR analysis showed hemodynamically significant FFR in the distal LAD. This is likely secondary to vessel tapering or small vessel disease.  RECOMMENDATIONS: Goal directed medical therapy and aggressive risk factor modification for secondary prevention of coronary artery disease.   Electronically Signed By: Weston Brass On: 08/17/2019 21:20   CT CORONARY MORPH W/CTA COR W/SCORE 08/16/2019  Addendum 08/17/2019  3:03 PM ADDENDUM REPORT: 08/17/2019 14:58  HISTORY: Cardiomyopathy, undefined, further testing abnormal lexiscan  EXAM: Cardiac/Coronary  CT  TECHNIQUE: The patient was scanned on a [employer].  PROTOCOL: A 120 kV prospective scan was triggered in the descending thoracic aorta at 111 HU's. Axial non-contrast 3 mm slices were carried out through the heart. The data set was analyzed on a dedicated work station and scored using the Agatson method. Gantry rotation speed was 250  msecs and collimation was .6 mm. Beta blockade and 0.8 mg of sl NTG was given. The 3D data set was reconstructed in 5% intervals of the 67-82 % of the R-R cycle. Diastolic phases were analyzed on a dedicated work station using MPR, MIP and VRT modes. The patient received  80mL OMNIPAQUE IOHEXOL 350 MG/ML SOLN of contrast.  FINDINGS: Coronary calcium score is 168, which places the patient in the 13 percentile for age and sex matched control.  Coronary arteries: Normal coronary origins.  Right dominance.  Right Coronary Artery: No detectable plaque or stenosis.  Left Main Coronary Artery: Mild mixed atherosclerotic plaque in the mid left main coronary artery, 25-49% stenosis.  Left Anterior Descending Coronary Artery: Moderate mixed atherosclerotic plaque in the proximal LAD, 50-69% stenosis, with a serial mild mixed atherosclerotic plaque, 25-49% stenosis.  Left Circumflex Artery: Minimal atherosclerotic plaque in the proximal left circumflex, <25% stenosis.  Aorta: Normal size, 29 mm at the mid ascending aorta (level of the PA bifurcation) measured double oblique. Moderate calcifications in descending thoracic . No dissection.  Aortic Valve: No calcifications.  Other findings:  Normal pulmonary vein drainage into the left atrium.  Normal left atrial appendage without a thrombus.  Mildly dilated main pulmonary artery, 30 mm.  IMPRESSION: 1. Moderate CAD in proximal LAD and mild CAD in mid left main coronary artery, CADRADS = 3. CT FFR will be performed and reported separately.  2. Coronary calcium score is 168, which places the patient in the 30 percentile for age and sex matched control.  3. Normal coronary origin with right dominance.  4. Mildly dilated main pulmonary artery, 30 mm.   Electronically Signed By: Weston Brass On: 08/17/2019 14:58  Narrative EXAM: OVER-READ INTERPRETATION  CT CHEST  The following report is an over-read performed by radiologist Dr. Trudie Reed of [facility] on 08/16/2019. This over-read does not include interpretation of cardiac or coronary anatomy or pathology. The coronary calcium score/coronary CTA interpretation by the cardiologist is attached.  COMPARISON:   None.  FINDINGS: Tip of central venous catheter in the distal superior vena cava. Aortic atherosclerosis. Within the visualized portions of the thorax there are no suspicious appearing pulmonary nodules or masses, there is no acute consolidative airspace disease, no pleural effusions, no pneumothorax and no lymphadenopathy. Visualized portions of the upper abdomen are unremarkable. There are no aggressive appearing lytic or blastic lesions noted in the visualized portions of the skeleton.  IMPRESSION: 1.  Aortic Atherosclerosis (ICD10-I70.0).  Electronically Signed: By: Trudie Reed M.D. On: 08/16/2019 10:50          Risk Assessment/Calculations:   {Does this patient have ATRIAL FIBRILLATION?[phone]} No BP recorded.  {Refresh Note OR Click here to enter BP  :1}***       Physical Exam:   VS:  There were no vitals taken for this visit.   Wt Readings from Last 3 Encounters:  07/29/22 245 lb 4.8 oz (111.3 kg)  01/20/22 248 lb 3.2 oz (112.6 kg)  10/13/21 245 lb (111.1 kg)    GEN: Well nourished, well developed in no acute distress NECK: No JVD; No carotid bruits CARDIAC: ***RRR, no murmurs, rubs, gallops RESPIRATORY:  Clear to auscultation without rales, wheezing or rhonchi  ABDOMEN: Soft, non-tender, non-distended EXTREMITIES:  No edema; No deformity   ASSESSMENT AND PLAN: .   ***    {Are you ordering a CV Procedure (e.g. stress test, cath, DCCV, TEE, etc)?   Press F2        :  [id number]}  Dispo: ***  Signed, Flossie Dibble, NP

## 2023-01-24 ENCOUNTER — Ambulatory Visit: Payer: 59 | Attending: Cardiology | Admitting: Cardiology

## 2023-01-24 DIAGNOSIS — I2510 Atherosclerotic heart disease of native coronary artery without angina pectoris: Secondary | ICD-10-CM

## 2023-01-24 DIAGNOSIS — I429 Cardiomyopathy, unspecified: Secondary | ICD-10-CM

## 2023-01-24 DIAGNOSIS — E782 Mixed hyperlipidemia: Secondary | ICD-10-CM

## 2023-02-17 ENCOUNTER — Inpatient Hospital Stay: Payer: 59 | Attending: Oncology | Admitting: Oncology

## 2023-02-17 ENCOUNTER — Inpatient Hospital Stay: Payer: 59

## 2023-02-17 DIAGNOSIS — R7989 Other specified abnormal findings of blood chemistry: Secondary | ICD-10-CM | POA: Diagnosis not present

## 2023-02-17 DIAGNOSIS — M25552 Pain in left hip: Secondary | ICD-10-CM | POA: Diagnosis not present

## 2023-02-17 DIAGNOSIS — Z923 Personal history of irradiation: Secondary | ICD-10-CM | POA: Diagnosis not present

## 2023-02-17 DIAGNOSIS — Z9221 Personal history of antineoplastic chemotherapy: Secondary | ICD-10-CM | POA: Insufficient documentation

## 2023-02-17 DIAGNOSIS — I429 Cardiomyopathy, unspecified: Secondary | ICD-10-CM | POA: Insufficient documentation

## 2023-02-17 DIAGNOSIS — M25551 Pain in right hip: Secondary | ICD-10-CM | POA: Diagnosis not present

## 2023-02-17 DIAGNOSIS — J069 Acute upper respiratory infection, unspecified: Secondary | ICD-10-CM | POA: Diagnosis not present

## 2023-02-17 DIAGNOSIS — Z853 Personal history of malignant neoplasm of breast: Secondary | ICD-10-CM | POA: Diagnosis present

## 2023-02-17 DIAGNOSIS — G8929 Other chronic pain: Secondary | ICD-10-CM | POA: Diagnosis not present

## 2023-02-17 DIAGNOSIS — M549 Dorsalgia, unspecified: Secondary | ICD-10-CM | POA: Diagnosis not present

## 2023-02-17 DIAGNOSIS — Z87891 Personal history of nicotine dependence: Secondary | ICD-10-CM | POA: Diagnosis not present

## 2023-02-17 DIAGNOSIS — Z171 Estrogen receptor negative status [ER-]: Secondary | ICD-10-CM

## 2023-02-17 LAB — CBC WITH DIFFERENTIAL (CANCER CENTER ONLY)
Abs Immature Granulocytes: 0.02 10*3/uL (ref 0.00–0.07)
Basophils Absolute: 0 10*3/uL (ref 0.0–0.1)
Basophils Relative: 0 %
Eosinophils Absolute: 0.2 10*3/uL (ref 0.0–0.5)
Eosinophils Relative: 2 %
HCT: 41.1 % (ref 36.0–46.0)
Hemoglobin: 13.6 g/dL (ref 12.0–15.0)
Immature Granulocytes: 0 %
Lymphocytes Relative: 33 %
Lymphs Abs: 3.2 10*3/uL (ref 0.7–4.0)
MCH: 27.9 pg (ref 26.0–34.0)
MCHC: 33.1 g/dL (ref 30.0–36.0)
MCV: 84.2 fL (ref 80.0–100.0)
Monocytes Absolute: 0.4 10*3/uL (ref 0.1–1.0)
Monocytes Relative: 4 %
Neutro Abs: 5.8 10*3/uL (ref 1.7–7.7)
Neutrophils Relative %: 61 %
Platelet Count: 328 10*3/uL (ref 150–400)
RBC: 4.88 MIL/uL (ref 3.87–5.11)
RDW: 12.6 % (ref 11.5–15.5)
WBC Count: 9.6 10*3/uL (ref 4.0–10.5)
nRBC: 0 % (ref 0.0–0.2)
nRBC: 0 /100{WBCs}

## 2023-02-17 LAB — CMP (CANCER CENTER ONLY)
ALT: 23 U/L (ref 0–44)
AST: 23 U/L (ref 15–41)
Albumin: 4.3 g/dL (ref 3.5–5.0)
Alkaline Phosphatase: 94 U/L (ref 38–126)
Anion gap: 12 (ref 5–15)
BUN: 12 mg/dL (ref 6–20)
CO2: 24 mmol/L (ref 22–32)
Calcium: 10.1 mg/dL (ref 8.9–10.3)
Chloride: 103 mmol/L (ref 98–111)
Creatinine: 1.16 mg/dL — ABNORMAL HIGH (ref 0.44–1.00)
GFR, Estimated: 54 mL/min — ABNORMAL LOW (ref >60)
Glucose, Bld: 100 mg/dL — ABNORMAL HIGH (ref 70–99)
Potassium: 4.2 mmol/L (ref 3.5–5.1)
Sodium: 138 mmol/L (ref 135–145)
Total Bilirubin: 0.5 mg/dL (ref 0.0–1.2)
Total Protein: 7.8 g/dL (ref 6.5–8.1)

## 2023-02-17 NOTE — Progress Notes (Incomplete)
 [facility]  [address] [place],  [place]  [address] [phone]  Clinic Day: 02/17/23   Referring physician: Loren Marlise Port, *  ASSESSMENT & PLAN:  Assessment & Plan: 1. 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  No primary breast lesion was found.  She was treated with neoadjuvant docetaxel/carboplatin/trastuzumab , left axillary lymph node dissection and adjuvant radiation therapy to the left breast and axilla. She completed 1 year of maintenance trastuzumab  in June 2022. She remains without evidence of malignancy.   2. Cardiomyopathy with decreased left ventricular ejection fraction.  This is being managed by Dr. Edwyna.  [name] from September revealed an ejection fraction of 39%, which was stable. Echocardiogram in November revealed an ejection fraction between 60-65%, which was a puzzle, as it has never been normal. Echocardiogram in March revealed an ejection fraction of 45-50%, which is closer to her baseline. She remains without symptoms of heart failure. [name] from April 2022 revealed stable left ventricular function, with an ejection fraction of 41%.   3. Bilateral hip and back pain, which is chronic.  MRI lumbar spine and left hip did not reveal any evidence of malignancy.  There were degenerative changes in the lumbar spine at L5-S1 several years ago. I am concerned about the possibility of spinal stenosis with her frequent falls. Unfortunately she will need to back off the NSAID's due to her elevated creatinine. She is having significant back pain at this time and is being followed with an orthopedic surgeon.  Plan She receives epidural injections every other month and states that this does help improve her pain temporarily. She had her annual screening bilateral mammogram done yesterday and the results are pending. Her labs today are pending. I will see her back on January 9th  with CBC and CMP. The patient understands the plans discussed today and is in agreement with them.  She knows to contact our office if she develops concerns prior to her next appointment.   I provided 18 minutes of face-to-face time during this this encounter and > 50% was spent counseling as documented under my assessment and plan.   [name], MD  [facility] AT [facility] [address] [place] [address] Dept: [phone] Dept Fax: [phone]   No orders of the defined types were placed in this encounter.   CHIEF COMPLAINT:  CC: Stage IIA HER2 positive breast cancer  Current Treatment:  Observation  HISTORY OF PRESENT ILLNESS:  Stage IIA (T0 N1 M0) HER2 receptor positive left breast cancer diagnosed in April 2021.  The patient felt a mass in the left axilla in March.  Her last bilateral screening mammogram had been in November 2019 with a diagnostic left mammogram and ultrasound in December which revealed probable inframammary lymph node over the upper outer quadrant.  Six-month follow-up was recommended. Ultrasound in March 2021 revealed multiple hypoechoic lesions of the left axilla with the largest up to 4.6 cm in diameter with another measuring 3.3 cm and another 2.6 cm.  Bilateral diagnostic mammogram at [facility] March revealed at least 7 enlarged intramammary and axillary lymph nodes.  No primary breast lesion was found.  Targeted left breast ultrasound revealed an increase in the previously demonstrated 7 mm intramammary node at 1:30 o'clock measured 13 mm with diffuse cortical thickening.  There were multiple enlarged left axillary nodes with eccentric cortical thickening with a maximum  thickness of 19.8 mm.  Ultrasound-guided biopsy of the left axillary lymph node in April revealed metastatic carcinoma consistent with breast primary. Estrogen and progesterone receptors were negative and HER 2 positive.  Ki 67  was 40%.  Staging CT chest, abdomen and pelvis revealed bulky left axillary lymphadenopathy extending to the sub pectoralis nodal station with a 6 mm right upper paratracheal lymph node.  There was no evidence of metastatic disease in the lungs, abdomen or pelvis.  Breast MRI revealed a 1 x 1.2 oval enhancing mass within the posterior upper outer left breast felt to be an intraparenchymal lymph node, no breast masses were seen.  There was re-demonstration of the left axillary adenopathy.  On physical examination the left axillary adenopathy measured approximately 9 x 7 cm.  She had uterine ablation in 2011 for uterine fibroids, then experienced 1 day of uterine bleeding in 2012, but denies further bleeding.    We recommended neoadjuvant chemotherapy to include TCHP x 6 cycles with trastuzumab  and pertuzumab for one full year.  Unfortunately, echocardiogram  In May revealed ejection fraction between 40 and 45%.  This was confirmed with [name] on May 10th, which revealed an ejection fraction of 42%.  Based on these findings, we changed the treatment regimen to docetaxel/carboplatin/trastuzumab  [facility]) and eliminated pertuzumab, due to the potential cardiotoxicities from HER2 targeted therapies.  We also recommended repeat cardiac imaging every 6 weeks and instead of the standard every 12 weeks.  She was referred to Dr. Revankar and he started her on Entresto  and has been monitoring her cardiac function.  She reported some chest tightness to him, so he recommended a Lexiscan  sestamibi.  She had quit smoking.  She received her 1st cycle of docetaxel, carboplatin and trastuzumab  in May.  She experienced toxicities of decreased appetite, minimal nausea and vomiting, and generalized arthralgias.  Her biggest complaint was generalized achiness and hip pain, felt to be most likely due to [name].  Her pain was not controlled with hydrocodone/APAP 5/325 or 10/325, so she was given oxycodone  5 mg 1-2 tablets every 6 hours as  needed for pain.  She was placed on esomeprazole 40 mg daily for acid reflux. She quit smoking in April 2021.   She continued Entresto  daily.  She underwent nuclear stress test at Dr. Posey office in June, which revealed a fairly stable ejection fraction of 39%.  She was also recommended for CT coronary angiography, which is to be scheduled at [facility].  She underwent a coronary/cardiac CT in July revealed moderate CAD in proximal LAD and mild CAD in mid left main coronary artery, CADRADS = 3, coronary calcium  score is 168, which places the patient in the 29 percentile for age and dex matched control, normal coronary origin with right dominance and mildly dilated main pulmonary artery, 30 mm. [name] from August revealed a left ventricular ejection fraction of 39%, previously 42% in May, which was discussed with Dr. Revankar.  We discussed the risks and benefits with the patient again.  Overall, we feel that the benefit outweighs any further risk, as long as she is closely monitored.  Bilateral breast MRI in September revealed a decrease in the left axillary lymphadenopathy.  However, there was a persistent, but smaller circumscribed oval mass in the upper outer quadrant of the left breast.  We discussed this with Dr. Landry Daring of breast radiology and we suspect this could represent the breast primary, but biopsy was negative.  [name] imaging in September revealed a left ventricular fraction  is 39%, which was stable.    Ultrasound guided left breast core needle biopsy in September revealed lymphoid tissue with reactive lymphoid hyperplasia and was negative for granulomas or malignancy.  No metastatic breast carcinoma could be seen.  She then underwent left axillary lymph node dissection on October 14th with Dr. Joesph. Surgical pathology from this procedure found tumor present in 3 of 27 lymph nodes (3/27).  One lymph node was positive for macrometastasis measuring 4mm.  Two lymph nodes had micrometastasis.   There were no lymph nodes with isolated tumor cells and no extranodal tumor seen. Echocardiogram in November revealed a normal ejection fraction between 60-65% , which was a puzzle, as very different from her baseline.  MRI lumbar spine and left hip revealed mild-to-moderate facet arthropathy at L5-S1, which was worse on the right.  MRI left hip was negative.  She completed 1 year of trastuzumab  in June 2022.  Echocardiogram in March revealed an ejection fraction of 45-50%, which although decreased from November, was closer to her baseline. We decided to continue with trastuzumab . Our pharmacist recommended a [name] in 6 weeks instead of echo. [name] scan on April 25th revealed a slight increase in the ejection fraction at 41%. She did complete 1 full year of trastuzumab  and continues to be followed by Cardiology.  Oncology History  Malignant neoplasm of left breast (HCC)  06/01/2019 Cancer Staging   Staging form: Breast, AJCC 8th Edition - Clinical stage from 06/01/2019: cT0, cN1(f), cM0, ER-, PR-, HER2+ - Signed by Cornelius Wanda DEL, MD on 12/31/2019   09/05/2019 Initial Diagnosis   Malignant neoplasm of left breast (HCC)   11/22/2019 Cancer Staging   Staging form: Breast, AJCC 8th Edition - Pathologic stage from 11/22/2019: No Stage Recommended (ypT0, pN1, cM0, ER-, PR-, HER2+) - Signed by Cornelius Wanda DEL, MD on 12/31/2019 Prognostic indicators: 3/27 nodes pos., no primary found   07/16/2020 Imaging   Bilateral diagnostic mammogram:  FINDINGS:  Surgical changes are seen in the left axilla. There are changes of  radiation therapy in the left breast. No suspicious mass or  malignant type microcalcifications identified in either breast.  IMPRESSION:  No evidence of malignancy in either breast.  RECOMMENDATION:  Bilateral diagnostic mammogram in 1 year is recommended.    Malignant neoplasm of left female breast (HCC)  06/22/2019 - 08/01/2020 Chemotherapy   Patient is on Treatment Plan :  BREAST  Trastuzumab  Maintenance q21d     11/23/2019 Initial Diagnosis   Malignant neoplasm of left female breast (HCC)   07/16/2020 Imaging   Bilateral diagnostic mammogram:  FINDINGS:  Surgical changes are seen in the left axilla. There are changes of  radiation therapy in the left breast. No suspicious mass or  malignant type microcalcifications identified in either breast.  IMPRESSION:  No evidence of malignancy in either breast.  RECOMMENDATION:  Bilateral diagnostic mammogram in 1 year is recommended.       INTERVAL HISTORY:  Dolora is here for routine follow up for stage IIA HER2 positive breast cancer diagnosed in April, 2021. Patient states that she feels *** and ***.      She denies signs of infection such as sore throat, sinus drainage, cough, or urinary symptoms.  She denies fevers or recurrent chills. She denies pain. She denies nausea, vomiting, chest pain, dyspnea or cough. Her appetite is *** and her weight {Weight change:10426}.   Patient states that she feels ok and complains of back pain rating a 7/10. She receives epidural injections every  other month and states that this does help improve her pain temporarily. She had her annual screening bilateral mammogram done yesterday and the results are pending. Her labs today are pending. I will see her back on January 9th with CBC and CMP. She denies signs of infection such as sore throat, sinus drainage, cough, or urinary symptoms.  She denies fevers or recurrent chills. She denies pain. She denies nausea, vomiting, chest pain, dyspnea or cough. Her appetite is good and her weight has decreased 3 pounds over last 7 months .  REVIEW OF SYSTEMS:  Review of Systems  Constitutional: Negative.  Negative for appetite change, chills, diaphoresis, fatigue, fever and unexpected weight change.  HENT:  Negative.  Negative for hearing loss, lump/mass, mouth sores, nosebleeds, sore throat, tinnitus, trouble swallowing and voice change.    Eyes: Negative.  Negative for eye problems and icterus.  Respiratory: Negative.  Negative for chest tightness, cough, hemoptysis, shortness of breath and wheezing.   Cardiovascular: Negative.  Negative for chest pain, leg swelling and palpitations.  Gastrointestinal: Negative.  Negative for abdominal distention, abdominal pain, blood in stool, constipation, diarrhea, nausea, rectal pain and vomiting.  Endocrine: Negative.   Genitourinary: Negative.  Negative for bladder incontinence, difficulty urinating, dyspareunia, dysuria, frequency, hematuria, menstrual problem, nocturia, pelvic pain, vaginal bleeding and vaginal discharge.   Musculoskeletal:  Positive for back pain (7/10). Negative for arthralgias, flank pain, gait problem, myalgias, neck pain and neck stiffness.  Skin: Negative.  Negative for itching, rash and wound.  Neurological:  Negative for dizziness, extremity weakness, gait problem, headaches, light-headedness, numbness, seizures and speech difficulty.  Hematological: Negative.  Negative for adenopathy. Does not bruise/bleed easily.  Psychiatric/Behavioral: Negative.  Negative for confusion, decreased concentration, depression, sleep disturbance and suicidal ideas. The patient is not nervous/anxious.      VITALS:  There were no vitals taken for this visit.  Wt Readings from Last 3 Encounters:  07/29/22 245 lb 4.8 oz (111.3 kg)  01/20/22 248 lb 3.2 oz (112.6 kg)  10/13/21 245 lb (111.1 kg)    There is no height or weight on file to calculate BMI.  Performance status (ECOG): 1 - Symptomatic but completely ambulatory  PHYSICAL EXAM:  Physical Exam Vitals and nursing note reviewed.  Constitutional:      General: She is not in acute distress.    Appearance: Normal appearance. She is normal weight. She is not ill-appearing, toxic-appearing or diaphoretic.  HENT:     Head: Normocephalic and atraumatic.     Right Ear: Tympanic membrane, ear canal and external ear normal. There  is no impacted cerumen.     Left Ear: Tympanic membrane, ear canal and external ear normal. There is no impacted cerumen.     Nose: Nose normal. No congestion or rhinorrhea.     Mouth/Throat:     Mouth: Mucous membranes are moist.     Pharynx: Oropharynx is clear. No oropharyngeal exudate or posterior oropharyngeal erythema.  Eyes:     General: No scleral icterus.       Right eye: No discharge.        Left eye: No discharge.     Extraocular Movements: Extraocular movements intact.     Conjunctiva/sclera: Conjunctivae normal.     Pupils: Pupils are equal, round, and reactive to light.  Neck:     Vascular: No carotid bruit.  Cardiovascular:     Rate and Rhythm: Normal rate and regular rhythm.     Pulses: Normal pulses.  Heart sounds: Normal heart sounds. No murmur heard.    No friction rub. No gallop.  Pulmonary:     Effort: Pulmonary effort is normal. No respiratory distress.     Breath sounds: Normal breath sounds. No stridor. No wheezing, rhonchi or rales.  Chest:     Chest wall: No tenderness.  Breasts:    Right: Normal.     Left: Normal.     Comments: No masses in either breast.  Firmness in the left breast.  Healed incision in the left breast.   Abdominal:     General: Bowel sounds are normal. There is no distension.     Palpations: Abdomen is soft. There is no hepatomegaly, splenomegaly or mass.     Tenderness: There is no abdominal tenderness. There is no right CVA tenderness, left CVA tenderness, guarding or rebound.     Hernia: No hernia is present.  Musculoskeletal:        General: No swelling, tenderness, deformity or signs of injury. Normal range of motion.     Cervical back: Normal range of motion and neck supple. No rigidity or tenderness.     Right lower leg: No edema.     Left lower leg: No edema.  Lymphadenopathy:     Cervical: No cervical adenopathy.     Right cervical: No superficial, deep or posterior cervical adenopathy.    Left cervical: No  superficial, deep or posterior cervical adenopathy.     Upper Body:     Right upper body: No supraclavicular, axillary or pectoral adenopathy.     Left upper body: No supraclavicular, axillary or pectoral adenopathy.  Skin:    General: Skin is warm and dry.     Coloration: Skin is not jaundiced or pale.     Findings: No bruising, erythema, lesion or rash.  Neurological:     General: No focal deficit present.     Mental Status: She is alert and oriented to person, place, and time. Mental status is at baseline.     Cranial Nerves: No cranial nerve deficit.     Sensory: No sensory deficit.     Motor: No weakness.     Coordination: Coordination normal.     Gait: Gait normal.     Deep Tendon Reflexes: Reflexes normal.  Psychiatric:        Mood and Affect: Mood normal.        Behavior: Behavior normal.        Thought Content: Thought content normal.        Judgment: Judgment normal.    LABS:       Latest Ref Rng & Units 07/29/2022    9:07 AM 01/20/2022    8:54 AM 08/21/2021    4:52 PM  CBC  WBC 4.0 - 10.5 K/uL 8.9  6.9  7.6   Hemoglobin 12.0 - 15.0 g/dL 86.9  86.9  87.0   Hematocrit 36.0 - 46.0 % 40.4  41.1  37.5   Platelets 150 - 400 K/uL 313  293  305       Latest Ref Rng & Units 07/29/2022    9:07 AM 01/20/2022    8:54 AM 10/13/2021   11:17 AM  CMP  Glucose 70 - 99 mg/dL 880  97  886   BUN 6 - 20 mg/dL 14  15  14    Creatinine 0.44 - 1.00 mg/dL 8.94  8.89  8.87   Sodium 135 - 145 mmol/L 139  141  138   Potassium  3.5 - 5.1 mmol/L 3.8  3.8  3.7   Chloride 98 - 111 mmol/L 109  107  102   CO2 22 - 32 mmol/L 21  27  16    Calcium  8.9 - 10.3 mg/dL 9.3  9.9  9.9   Total Protein 6.5 - 8.1 g/dL 7.6  7.6  7.5   Total Bilirubin 0.3 - 1.2 mg/dL 0.9  0.8  0.5   Alkaline Phos 38 - 126 U/L 68  61  81   AST 15 - 41 U/L 30  20  20    ALT 0 - 44 U/L 25  18  17      STUDIES:    HISTORY:   Allergies:  Allergies  Allergen Reactions   Sulfa Antibiotics Hives    Current  Medications: Current Outpatient Medications  Medication Sig Dispense Refill   acetaminophen  (TYLENOL ) 500 MG tablet Take 500 mg by mouth every 6 (six) hours as needed for moderate pain or mild pain (pain.).     Artificial Tear Solution (GENTEAL TEARS) 0.1-0.2-0.3 % SOLN Place 1 drop into both eyes at bedtime.     aspirin  EC 81 MG tablet Take 81 mg by mouth daily. Swallow whole.     atorvastatin  (LIPITOR) 20 MG tablet Take 1 tablet (20 mg total) by mouth daily. Patient needs appointment for further refills. 1 st attempt 30 tablet 0   cetirizine (ZYRTEC) 10 MG tablet Take 10 mg by mouth daily as needed for allergies.     clonazePAM (KLONOPIN) 1 MG tablet Take 1 mg by mouth 2 (two) times daily.     cyanocobalamin (,VITAMIN B-12,) 1000 MCG/ML injection Inject 1,000 mcg into the muscle every 30 (thirty) days. (Patient not taking: Reported on 10/13/2021)     doxycycline (VIBRAMYCIN) 100 MG capsule Take 100 mg by mouth 2 (two) times daily. (Patient not taking: Reported on 10/13/2021)     DULoxetine (CYMBALTA) 30 MG capsule Take 30 mg by mouth in the morning.     famotidine (PEPCID) 40 MG tablet Take 40 mg by mouth in the morning.     folic acid (FOLVITE) 1 MG tablet Take 1 mg by mouth in the morning.     gabapentin (NEURONTIN) 600 MG tablet Take 600 mg by mouth 2 (two) times daily.     hydrOXYzine (ATARAX/VISTARIL) 25 MG tablet Take 25 mg by mouth 4 (four) times daily as needed for itching or rash.     levothyroxine (SYNTHROID) 75 MCG tablet Take 75 mcg by mouth daily.     loratadine (CLARITIN) 10 MG tablet Take 10 mg by mouth daily as needed for allergies.     nitroGLYCERIN  (NITROSTAT ) 0.4 MG SL tablet Place 0.4 mg under the tongue every 5 (five) minutes as needed for chest pain.     ondansetron  (ZOFRAN ) 4 MG tablet Take 4 mg by mouth every 4 (four) hours as needed for nausea/vomiting.     Oxycodone  HCl 10 MG TABS Take 10 mg by mouth in the morning, at noon, in the evening, and at bedtime.     pravastatin  (PRAVACHOL) 20 MG tablet Take 20 mg by mouth daily.     sacubitril -valsartan  (ENTRESTO ) 24-26 MG Take 1 tablet by mouth 2 (two) times daily. 60 tablet 12   traZODone (DESYREL) 50 MG tablet Take 50 mg by mouth at bedtime.     venlafaxine XR (EFFEXOR-XR) 150 MG 24 hr capsule Take 150 mg by mouth in the morning.     Vitamin D, Ergocalciferol, (DRISDOL) 1.25  MG (50000 UNIT) CAPS capsule Take 50,000 Units by mouth every Monday.     No current facility-administered medications for this visit.   Facility-Administered Medications Ordered in Other Visits  Medication Dose Route Frequency Provider Last Rate Last Admin   sodium chloride  flush (NS) 0.9 % injection 10 mL  10 mL Intracatheter PRN Mosher, Kelli A, PA-C   10 mL at 11/18/20 1123    I,Jasmine M Lassiter,acting as a neurosurgeon for [name], MD.,have documented all relevant documentation on the behalf of [name], MD,as directed by  [name], MD while in the presence of [name], MD.

## 2023-03-09 ENCOUNTER — Other Ambulatory Visit: Payer: Self-pay | Admitting: Oncology

## 2023-03-09 ENCOUNTER — Encounter: Payer: Self-pay | Admitting: Oncology

## 2023-03-09 ENCOUNTER — Inpatient Hospital Stay (HOSPITAL_BASED_OUTPATIENT_CLINIC_OR_DEPARTMENT_OTHER): Payer: 59 | Admitting: Oncology

## 2023-03-09 ENCOUNTER — Telehealth: Payer: Self-pay | Admitting: Oncology

## 2023-03-09 VITALS — BP 137/79 | HR 75 | Temp 98.5°F | Resp 20 | Ht 63.0 in | Wt 242.1 lb

## 2023-03-09 DIAGNOSIS — J209 Acute bronchitis, unspecified: Secondary | ICD-10-CM

## 2023-03-09 DIAGNOSIS — Z171 Estrogen receptor negative status [ER-]: Secondary | ICD-10-CM

## 2023-03-09 DIAGNOSIS — C50112 Malignant neoplasm of central portion of left female breast: Secondary | ICD-10-CM | POA: Diagnosis not present

## 2023-03-09 DIAGNOSIS — Z853 Personal history of malignant neoplasm of breast: Secondary | ICD-10-CM | POA: Diagnosis not present

## 2023-03-09 MED ORDER — METHYLPREDNISOLONE 4 MG PO TBPK: ORAL_TABLET | ORAL | 0 refills | Status: DC

## 2023-03-09 NOTE — Telephone Encounter (Signed)
03/09/23 Next appt scheduled and confirmed with patient

## 2023-03-09 NOTE — Progress Notes (Signed)
[facility]  [address] [place],  [place]  [address] [phone]  Clinic Day: 03/09/23  Referring physician: Riesa Pope, *  ASSESSMENT & PLAN:  Assessment & Plan: 1. 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  No primary breast lesion was found.  She was treated with neoadjuvant docetaxel/carboplatin/trastuzumab, left axillary lymph node dissection and adjuvant radiation therapy to the left breast and axilla. She completed 1 year of maintenance trastuzumab in June 2022. She remains without evidence of malignancy.   2. Cardiomyopathy with decreased left ventricular ejection fraction.  This is being managed by Dr. Tomie China.  [name] from September revealed an ejection fraction of 39%, which was stable. Echocardiogram in November revealed an ejection fraction between 60-65%, which was a puzzle, as it has never been normal. Echocardiogram in March revealed an ejection fraction of 45-50%, which is closer to her baseline. She remains without symptoms of heart failure. [name] from April 2022 revealed stable left ventricular function, with an ejection fraction of 41%.   3. Bilateral hip and back pain, which is chronic.  MRI lumbar spine and left hip did not reveal any evidence of malignancy.  There were degenerative changes in the lumbar spine at L5-S1 several years ago. Unfortunately I advised her to avoid NSAID's due to her elevated creatinine.   Plan: I will prescribe a Medrol dose pack to help alleviate her lingering upper respiratory infection. Her annual; screening mammogram will be due towards the end of June. She had labs done on 02/17/2023 and had a WBC of 9.6, hemoglobin of 13.6, and platelet count of 328,000. Her CMP is normal other than a elevated creatinine of 1.16 up from 1.06. I will see her back in 6 months with CBC and CMP. The patient understands the plans discussed today and is in  agreement with them.  She knows to contact our office if she develops concerns prior to her next appointment.  I provided 13 minutes of face-to-face time during this this encounter and > 50% was spent counseling as documented under my assessment and plan.   Dellia Beckwith, MD  [facility] [address] [place] [address] Dept: [phone] Dept Fax: [phone]   No orders of the defined types were placed in this encounter.   CHIEF COMPLAINT:  CC: Stage IIA HER2 positive breast cancer  Current Treatment:  Observation  HISTORY OF PRESENT ILLNESS:  Stage IIA (T0 N1 M0) HER2 receptor positive left breast cancer diagnosed in April 2021.  The patient felt a mass in the left axilla in March.  Her last bilateral screening mammogram had been in November 2019 with a diagnostic left mammogram and ultrasound in December which revealed probable inframammary lymph node over the upper outer quadrant.  Six-month follow-up was recommended. Ultrasound in March 2021 revealed multiple hypoechoic lesions of the left axilla with the largest up to 4.6 cm in diameter with another measuring 3.3 cm and another 2.6 cm.  Bilateral diagnostic mammogram at [facility] March revealed at least 7 enlarged intramammary and axillary lymph nodes.  No primary breast lesion was found.  Targeted left breast ultrasound revealed an increase in the previously demonstrated 7 mm intramammary node at 1:30 o'clock measured 13 mm with diffuse cortical thickening.  There were multiple enlarged left axillary nodes with eccentric cortical thickening with a maximum thickness of 19.8 mm.  Ultrasound-guided biopsy of  the left axillary lymph node in April revealed metastatic carcinoma consistent with breast primary. Estrogen and progesterone receptors were negative and HER 2 positive.  Ki 67 was 40%.  Staging CT chest, abdomen and pelvis revealed bulky left axillary  lymphadenopathy extending to the sub pectoralis nodal station with a 6 mm right upper paratracheal lymph node.  There was no evidence of metastatic disease in the lungs, abdomen or pelvis.  Breast MRI revealed a 1 x 1.2 oval enhancing mass within the posterior upper outer left breast felt to be an intraparenchymal lymph node, no breast masses were seen.  There was re-demonstration of the left axillary adenopathy.  On physical examination the left axillary adenopathy measured approximately 9 x 7 cm.  She had uterine ablation in 2011 for uterine fibroids, then experienced 1 day of uterine bleeding in 2012, but denies further bleeding.    We recommended neoadjuvant chemotherapy to include TCHP x 6 cycles with trastuzumab and pertuzumab for one full year.  Unfortunately, echocardiogram  In May revealed ejection fraction between 40 and 45%.  This was confirmed with [name] on May 10th, which revealed an ejection fraction of 42%.  Based on these findings, we changed the treatment regimen to docetaxel/carboplatin/trastuzumab [facility]) and eliminated pertuzumab, due to the potential cardiotoxicities from HER2 targeted therapies.  We also recommended repeat cardiac imaging every 6 weeks and instead of the standard every 12 weeks.  She was referred to Dr. Tomie China and he started her on Entresto and has been monitoring her cardiac function.  She reported some chest tightness to him, so he recommended a [name].  She had quit smoking.  She received her 1st cycle of docetaxel, carboplatin and trastuzumab in May.  She experienced toxicities of decreased appetite, minimal nausea and vomiting, and generalized arthralgias.  Her biggest complaint was generalized achiness and hip pain, felt to be most likely due to [name].  Her pain was not controlled with hydrocodone/APAP 5/325 or 10/325, so she was given oxycodone 5 mg 1-2 tablets every 6 hours as needed for pain.  She was placed on esomeprazole 40 mg daily for acid reflux.  She quit smoking in April 2021.   She continued Entresto daily.  She underwent nuclear stress test at Dr. Kem Parkinson office in June, which revealed a fairly stable ejection fraction of 39%.  She was also recommended for CT coronary angiography, which is to be scheduled at [facility].  She underwent a coronary/cardiac CT in July revealed moderate CAD in proximal LAD and mild CAD in mid left main coronary artery, CADRADS = 3, coronary calcium score is 168, which places the patient in the 52 percentile for age and dex matched control, normal coronary origin with right dominance and mildly dilated main pulmonary artery, 30 mm. [name] from August revealed a left ventricular ejection fraction of 39%, previously 42% in May, which was discussed with Dr. Tomie China.  We discussed the risks and benefits with the patient again.  Overall, we feel that the benefit outweighs any further risk, as long as she is closely monitored.  Bilateral breast MRI in September revealed a decrease in the left axillary lymphadenopathy.  However, there was a persistent, but smaller circumscribed oval mass in the upper outer quadrant of the left breast.  We discussed this with Dr. Rosalie Gums of breast radiology and we suspect this could represent the breast primary, but biopsy was negative.  [name] imaging in September revealed a left ventricular fraction is 39%, which was stable.  Ultrasound guided left breast core needle biopsy in September revealed lymphoid tissue with reactive lymphoid hyperplasia and was negative for granulomas or malignancy.  No metastatic breast carcinoma could be seen.  She then underwent left axillary lymph node dissection on October 14th with Dr. Lequita Halt. Surgical pathology from this procedure found tumor present in 3 of 27 lymph nodes (3/27).  One lymph node was positive for macrometastasis measuring 4mm.  Two lymph nodes had micrometastasis.  There were no lymph nodes with isolated tumor cells and no extranodal tumor  seen. Echocardiogram in November revealed a normal ejection fraction between 60-65% , which was a puzzle, as very different from her baseline.  MRI lumbar spine and left hip revealed mild-to-moderate facet arthropathy at L5-S1, which was worse on the right.  MRI left hip was negative.  She completed 1 year of trastuzumab in June 2022.  Echocardiogram in March revealed an ejection fraction of 45-50%, which although decreased from November, was closer to her baseline. We decided to continue with trastuzumab. Our pharmacist recommended a [name] in 6 weeks instead of echo. [name] scan on April 25th revealed a slight increase in the ejection fraction at 41%. She did complete 1 full year of trastuzumab and continues to be followed by Cardiology.  Oncology History  Malignant neoplasm of left breast (HCC)  06/01/2019 Cancer Staging   Staging form: Breast, AJCC 8th Edition - Clinical stage from 06/01/2019: cT0, cN1(f), cM0, ER-, PR-, HER2+ - Signed by Dellia Beckwith, MD on 12/31/2019   09/05/2019 Initial Diagnosis   Malignant neoplasm of left breast (HCC)   11/22/2019 Cancer Staging   Staging form: Breast, AJCC 8th Edition - Pathologic stage from 11/22/2019: No Stage Recommended (ypT0, pN1, cM0, ER-, PR-, HER2+) - Signed by Dellia Beckwith, MD on 12/31/2019 Prognostic indicators: 3/27 nodes pos., no primary found   07/16/2020 Imaging   Bilateral diagnostic mammogram:  FINDINGS:  Surgical changes are seen in the left axilla. There are changes of  radiation therapy in the left breast. No suspicious mass or  malignant type microcalcifications identified in either breast.  IMPRESSION:  No evidence of malignancy in either breast.  RECOMMENDATION:  Bilateral diagnostic mammogram in 1 year is recommended.    Malignant neoplasm of left female breast (HCC)  06/22/2019 - 08/01/2020 Chemotherapy   Patient is on Treatment Plan : BREAST  Trastuzumab Maintenance q21d     11/23/2019 Initial Diagnosis    Malignant neoplasm of left female breast (HCC)   07/16/2020 Imaging   Bilateral diagnostic mammogram:  FINDINGS:  Surgical changes are seen in the left axilla. There are changes of  radiation therapy in the left breast. No suspicious mass or  malignant type microcalcifications identified in either breast.  IMPRESSION:  No evidence of malignancy in either breast.  RECOMMENDATION:  Bilateral diagnostic mammogram in 1 year is recommended.       INTERVAL HISTORY:  Brandi Weaver is here for routine follow up for stage IIA HER2 positive breast cancer diagnosed in April, 2021. This was metastatic to axillary nodes but no primary of the breast was ever found. Patient complains of a dry cough that has been lingering since having a virus at Christmas time. She is not able to see her PCP until mid February. She denies any fever or purulent sputum and so I don't think she has a bacterial infection at this time, I will hold off on antibiotics. I will prescribe a Medrol dose pack to help alleviate her symptoms. Her annual; screening mammogram  will be due at the end of June. She had labs done on 02/17/2023 and had a WBC of 9.6, hemoglobin of 13.6, and platelet count of 328,000. Her CMP is normal other than a elevated creatinine of 1.16 up from 1.06. I will see her back in 6 months with CBC and CMP.   She denies signs of infection such as sore throat, sinus drainage, or urinary symptoms.  She denies fevers or recurrent chills. She denies pain. She denies nausea, vomiting, chest pain, dyspnea. Her appetite is good and her weight has decreased 3 pounds over last 7 months .   REVIEW OF SYSTEMS:  Review of Systems  Constitutional: Negative.  Negative for appetite change, chills, diaphoresis, fatigue, fever and unexpected weight change.  HENT:  Negative.  Negative for hearing loss, lump/mass, mouth sores, nosebleeds, sore throat, tinnitus, trouble swallowing and voice change.   Eyes: Negative.  Negative for eye problems  and icterus.  Respiratory:  Positive for cough (since Christmas 2024). Negative for chest tightness, hemoptysis, shortness of breath and wheezing.   Cardiovascular: Negative.  Negative for chest pain, leg swelling and palpitations.  Gastrointestinal: Negative.  Negative for abdominal distention, abdominal pain, blood in stool, constipation, diarrhea, nausea, rectal pain and vomiting.  Endocrine: Negative.   Genitourinary: Negative.  Negative for bladder incontinence, difficulty urinating, dyspareunia, dysuria, frequency, hematuria, menstrual problem, nocturia, pelvic pain, vaginal bleeding and vaginal discharge.   Musculoskeletal: Negative.  Negative for arthralgias, back pain, flank pain, gait problem, myalgias, neck pain and neck stiffness.  Skin: Negative.  Negative for itching, rash and wound.  Neurological:  Negative for dizziness, extremity weakness, gait problem, headaches, light-headedness, numbness, seizures and speech difficulty.  Hematological: Negative.  Negative for adenopathy. Does not bruise/bleed easily.  Psychiatric/Behavioral: Negative.  Negative for confusion, decreased concentration, depression, sleep disturbance and suicidal ideas. The patient is not nervous/anxious.      VITALS:  Blood pressure 137/79, pulse 75, temperature 98.5 F (36.9 C), temperature source Oral, resp. rate 20, height 5\' 3"  (1.6 m), weight 242 lb 1.6 oz (109.8 kg), SpO2 100%.  Wt Readings from Last 3 Encounters:  03/09/23 242 lb 1.6 oz (109.8 kg)  07/29/22 245 lb 4.8 oz (111.3 kg)  01/20/22 248 lb 3.2 oz (112.6 kg)    Body mass index is 42.89 kg/m.  Performance status (ECOG): 1 - Symptomatic but completely ambulatory  PHYSICAL EXAM:  Physical Exam Vitals and nursing note reviewed.  Constitutional:      General: She is not in acute distress.    Appearance: Normal appearance. She is normal weight. She is not ill-appearing, toxic-appearing or diaphoretic.  HENT:     Head: Normocephalic and  atraumatic.     Right Ear: Tympanic membrane, ear canal and external ear normal. There is no impacted cerumen.     Left Ear: Tympanic membrane, ear canal and external ear normal. There is no impacted cerumen.     Nose: Nose normal. No congestion or rhinorrhea.     Mouth/Throat:     Mouth: Mucous membranes are moist.     Pharynx: Oropharynx is clear. No oropharyngeal exudate or posterior oropharyngeal erythema.  Eyes:     General: No scleral icterus.       Right eye: No discharge.        Left eye: No discharge.     Extraocular Movements: Extraocular movements intact.     Conjunctiva/sclera: Conjunctivae normal.     Pupils: Pupils are equal, round, and reactive to light.  Neck:  Vascular: No carotid bruit.  Cardiovascular:     Rate and Rhythm: Normal rate and regular rhythm.     Pulses: Normal pulses.     Heart sounds: Normal heart sounds. No murmur heard.    No friction rub. No gallop.  Pulmonary:     Effort: Pulmonary effort is normal. No respiratory distress.     Breath sounds: Normal breath sounds. No stridor. No wheezing, rhonchi or rales.  Chest:     Chest wall: No tenderness.  Breasts:    Right: Normal.     Left: Normal.     Comments: Large left axillary scar which is well healed  Mild fibrocystic changes in the right breast No masses in either breasts Abdominal:     General: Bowel sounds are normal. There is no distension.     Palpations: Abdomen is soft. There is no hepatomegaly, splenomegaly or mass.     Tenderness: There is no abdominal tenderness. There is no right CVA tenderness, left CVA tenderness, guarding or rebound.     Hernia: No hernia is present.  Musculoskeletal:        General: No swelling, tenderness, deformity or signs of injury. Normal range of motion.     Cervical back: Normal range of motion and neck supple. No rigidity or tenderness.     Right lower leg: No edema.     Left lower leg: No edema.  Lymphadenopathy:     Cervical: No cervical  adenopathy.     Right cervical: No superficial, deep or posterior cervical adenopathy.    Left cervical: No superficial, deep or posterior cervical adenopathy.     Upper Body:     Right upper body: No supraclavicular, axillary or pectoral adenopathy.     Left upper body: No supraclavicular, axillary or pectoral adenopathy.  Skin:    General: Skin is warm and dry.     Coloration: Skin is not jaundiced or pale.     Findings: No bruising, erythema, lesion or rash.  Neurological:     General: No focal deficit present.     Mental Status: She is alert and oriented to person, place, and time. Mental status is at baseline.     Cranial Nerves: No cranial nerve deficit.     Sensory: No sensory deficit.     Motor: No weakness.     Coordination: Coordination normal.     Gait: Gait normal.     Deep Tendon Reflexes: Reflexes normal.  Psychiatric:        Mood and Affect: Mood normal.        Behavior: Behavior normal.        Thought Content: Thought content normal.        Judgment: Judgment normal.    LABS:      Latest Ref Rng & Units 02/17/2023    9:46 AM 07/29/2022    9:07 AM 01/20/2022    8:54 AM  CBC  WBC 4.0 - 10.5 K/uL 9.6  8.9  6.9   Hemoglobin 12.0 - 15.0 g/dL 19.1  47.8  29.5   Hematocrit 36.0 - 46.0 % 41.1  40.4  41.1   Platelets 150 - 400 K/uL 328  313  293       Latest Ref Rng & Units 02/17/2023    9:46 AM 07/29/2022    9:07 AM 01/20/2022    8:54 AM  CMP  Glucose 70 - 99 mg/dL 621  308  97   BUN 6 - 20 mg/dL 12  14  15   Creatinine 0.44 - 1.00 mg/dL 1.91  4.78  2.95   Sodium 135 - 145 mmol/L 138  139  141   Potassium 3.5 - 5.1 mmol/L 4.2  3.8  3.8   Chloride 98 - 111 mmol/L 103  109  107   CO2 22 - 32 mmol/L 24  21  27    Calcium 8.9 - 10.3 mg/dL 62.1  9.3  9.9   Total Protein 6.5 - 8.1 g/dL 7.8  7.6  7.6   Total Bilirubin 0.0 - 1.2 mg/dL 0.5  0.9  0.8   Alkaline Phos 38 - 126 U/L 94  68  61   AST 15 - 41 U/L 23  30  20    ALT 0 - 44 U/L 23  25  18     Lab Results   Component Value Date   TSH 0.208 (L) 12/07/2019   STUDIES:    HISTORY:   Allergies:  Allergies  Allergen Reactions   Sulfa Antibiotics Hives    Current Medications: Current Outpatient Medications  Medication Sig Dispense Refill   benzonatate (TESSALON) 200 MG capsule Take 200 mg by mouth 3 (three) times daily.     FARXIGA 5 MG TABS tablet Take 5 mg by mouth daily.     levothyroxine (SYNTHROID) 100 MCG tablet Take 100 mcg by mouth daily.     acetaminophen (TYLENOL) 500 MG tablet Take 500 mg by mouth every 6 (six) hours as needed for moderate pain or mild pain (pain.).     Artificial Tear Solution (GENTEAL TEARS) 0.1-0.2-0.3 % SOLN Place 1 drop into both eyes at bedtime.     aspirin EC 81 MG tablet Take 81 mg by mouth daily. Swallow whole.     atorvastatin (LIPITOR) 20 MG tablet Take 1 tablet (20 mg total) by mouth daily. Patient needs appointment for further refills. 1 st attempt 30 tablet 0   cetirizine (ZYRTEC) 10 MG tablet Take 10 mg by mouth daily as needed for allergies.     clonazePAM (KLONOPIN) 1 MG tablet Take 1 mg by mouth 2 (two) times daily.     cyanocobalamin (,VITAMIN B-12,) 1000 MCG/ML injection Inject 1,000 mcg into the muscle every 30 (thirty) days. (Patient not taking: Reported on 10/13/2021)     doxycycline (VIBRAMYCIN) 100 MG capsule Take 100 mg by mouth 2 (two) times daily. (Patient not taking: Reported on 10/13/2021)     DULoxetine (CYMBALTA) 30 MG capsule Take 30 mg by mouth in the morning.     famotidine (PEPCID) 40 MG tablet Take 40 mg by mouth in the morning.     folic acid (FOLVITE) 1 MG tablet Take 1 mg by mouth in the morning.     gabapentin (NEURONTIN) 600 MG tablet Take 600 mg by mouth 2 (two) times daily.     hydrOXYzine (ATARAX/VISTARIL) 25 MG tablet Take 25 mg by mouth 4 (four) times daily as needed for itching or rash.     loratadine (CLARITIN) 10 MG tablet Take 10 mg by mouth daily as needed for allergies.     methylPREDNISolone (MEDROL DOSEPAK) 4 MG  TBPK tablet As directed 21 tablet 0   nitroGLYCERIN (NITROSTAT) 0.4 MG SL tablet Place 0.4 mg under the tongue every 5 (five) minutes as needed for chest pain.     ondansetron (ZOFRAN) 4 MG tablet Take 4 mg by mouth every 4 (four) hours as needed for nausea/vomiting.     Oxycodone HCl 10 MG TABS Take 10 mg by  mouth in the morning, at noon, in the evening, and at bedtime.     pravastatin (PRAVACHOL) 20 MG tablet Take 20 mg by mouth daily.     sacubitril-valsartan (ENTRESTO) 24-26 MG Take 1 tablet by mouth 2 (two) times daily. 60 tablet 12   traZODone (DESYREL) 50 MG tablet Take 50 mg by mouth at bedtime.     venlafaxine XR (EFFEXOR-XR) 150 MG 24 hr capsule Take 150 mg by mouth in the morning.     Vitamin D, Ergocalciferol, (DRISDOL) 1.25 MG (50000 UNIT) CAPS capsule Take 50,000 Units by mouth every Monday.     No current facility-administered medications for this visit.   Facility-Administered Medications Ordered in Other Visits  Medication Dose Route Frequency Provider Last Rate Last Admin   sodium chloride flush (NS) 0.9 % injection 10 mL  10 mL Intracatheter PRN Mosher, Harvin Hazel A, PA-C   10 mL at 11/18/20 1123    I,Jasmine M Lassiter,acting as a Neurosurgeon for Dellia Beckwith, MD.,have documented all relevant documentation on the behalf of Dellia Beckwith, MD,as directed by  Dellia Beckwith, MD while in the presence of Dellia Beckwith, MD.

## 2023-03-14 ENCOUNTER — Encounter: Payer: Self-pay | Admitting: Hematology and Oncology

## 2023-09-02 LAB — HM MAMMOGRAPHY

## 2023-09-06 ENCOUNTER — Inpatient Hospital Stay: Payer: 59

## 2023-09-06 ENCOUNTER — Encounter: Payer: Self-pay | Admitting: Oncology

## 2023-09-06 ENCOUNTER — Telehealth: Payer: Self-pay | Admitting: Oncology

## 2023-09-06 ENCOUNTER — Inpatient Hospital Stay: Payer: 59 | Attending: Oncology | Admitting: Oncology

## 2023-09-06 ENCOUNTER — Other Ambulatory Visit: Payer: Self-pay | Admitting: Oncology

## 2023-09-06 VITALS — BP 119/57 | HR 65 | Temp 98.2°F | Resp 16 | Ht 63.0 in | Wt 245.2 lb

## 2023-09-06 DIAGNOSIS — Z171 Estrogen receptor negative status [ER-]: Secondary | ICD-10-CM | POA: Diagnosis not present

## 2023-09-06 DIAGNOSIS — Z9221 Personal history of antineoplastic chemotherapy: Secondary | ICD-10-CM | POA: Diagnosis not present

## 2023-09-06 DIAGNOSIS — C50112 Malignant neoplasm of central portion of left female breast: Secondary | ICD-10-CM

## 2023-09-06 DIAGNOSIS — Z853 Personal history of malignant neoplasm of breast: Secondary | ICD-10-CM | POA: Diagnosis present

## 2023-09-06 LAB — CBC WITH DIFFERENTIAL (CANCER CENTER ONLY)
Abs Immature Granulocytes: 0.02 K/uL (ref 0.00–0.07)
Basophils Absolute: 0 K/uL (ref 0.0–0.1)
Basophils Relative: 0 %
Eosinophils Absolute: 0.2 K/uL (ref 0.0–0.5)
Eosinophils Relative: 2 %
HCT: 39.2 % (ref 36.0–46.0)
Hemoglobin: 12.8 g/dL (ref 12.0–15.0)
Immature Granulocytes: 0 %
Lymphocytes Relative: 32 %
Lymphs Abs: 2.8 K/uL (ref 0.7–4.0)
MCH: 28.1 pg (ref 26.0–34.0)
MCHC: 32.7 g/dL (ref 30.0–36.0)
MCV: 86 fL (ref 80.0–100.0)
Monocytes Absolute: 0.3 K/uL (ref 0.1–1.0)
Monocytes Relative: 4 %
Neutro Abs: 5.3 K/uL (ref 1.7–7.7)
Neutrophils Relative %: 62 %
Platelet Count: 308 K/uL (ref 150–400)
RBC: 4.56 MIL/uL (ref 3.87–5.11)
RDW: 13.4 % (ref 11.5–15.5)
WBC Count: 8.6 K/uL (ref 4.0–10.5)
nRBC: 0 % (ref 0.0–0.2)

## 2023-09-06 LAB — CMP (CANCER CENTER ONLY)
ALT: 18 U/L (ref 0–44)
AST: 23 U/L (ref 15–41)
Albumin: 3.9 g/dL (ref 3.5–5.0)
Alkaline Phosphatase: 85 U/L (ref 38–126)
Anion gap: 13 (ref 5–15)
BUN: 12 mg/dL (ref 6–20)
CO2: 22 mmol/L (ref 22–32)
Calcium: 9.7 mg/dL (ref 8.9–10.3)
Chloride: 106 mmol/L (ref 98–111)
Creatinine: 1.19 mg/dL — ABNORMAL HIGH (ref 0.44–1.00)
GFR, Estimated: 52 mL/min — ABNORMAL LOW (ref >60)
Glucose, Bld: 131 mg/dL — ABNORMAL HIGH (ref 70–99)
Potassium: 3.5 mmol/L (ref 3.5–5.1)
Sodium: 141 mmol/L (ref 135–145)
Total Bilirubin: 0.6 mg/dL (ref 0.0–1.2)
Total Protein: 7.3 g/dL (ref 6.5–8.1)

## 2023-09-06 NOTE — Progress Notes (Signed)
 [facility]  [address] [place],  [place]  [address] [phone]  Clinic Day:09/06/23  Referring physician: Loren Marlise Port, *  ASSESSMENT & PLAN:  Assessment & Plan: 1. 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  No primary breast lesion was found.  She was treated with neoadjuvant docetaxel/carboplatin/trastuzumab , left axillary lymph node dissection and adjuvant radiation therapy to the left breast and axilla. She completed 1 year of maintenance trastuzumab  in June 2022. She remains without evidence of malignancy.   2. Cardiomyopathy with decreased left ventricular ejection fraction.  This is being managed by Dr. Edwyna.  [name] from September revealed an ejection fraction of 39%, which was stable. Echocardiogram in November revealed an ejection fraction between 60-65%, which was a puzzle, as it has never been normal. Echocardiogram in March revealed an ejection fraction of 45-50%, which is closer to her baseline. She remains without symptoms of heart failure. [name] from April 2022 revealed stable left ventricular function, with an ejection fraction of 41%.   3. Bilateral hip and back pain, which is chronic.  MRI lumbar spine and left hip did not reveal any evidence of malignancy.  There were degenerative changes in the lumbar spine at L5-S1 several years ago. I advised her to avoid NSAID's due to her elevated creatinine.   Plan: She had a bilateral screening mammogram done on 09/02/2023 which was clear. She has a WBC of 8.6, hemoglobin of 12.8, and platelet count of 308,000. Her CMP is normal other than a elevated creatinine of 1.19 and low normal potassium of 3.5. I advised her to increase potassium in her diet and gave her a list of potassium rich foods.  I will see her back in 6 months with CBC and CMP.  The patient understands the plans discussed today and is in agreement with them.  She knows to contact our  office if she develops concerns prior to her next appointment.  I provided 9 minutes of face-to-face time during this this encounter and > 50% was spent counseling as documented under my assessment and plan.   Brandi VEAR Cornish, MD  [facility] A DEPT OF [facility] [address] [place] [address] Dept: [phone] Dept Fax: [phone]   No orders of the defined types were placed in this encounter.  CHIEF COMPLAINT:  CC: Stage IIA HER2 positive breast cancer  Current Treatment:  Observation  HISTORY OF PRESENT ILLNESS:  Stage IIA (T0 N1 M0) HER2 receptor positive left breast cancer diagnosed in April 2021.  The patient felt a mass in the left axilla in March.  Her last bilateral screening mammogram had been in November 2019 with a diagnostic left mammogram and ultrasound in December which revealed probable inframammary lymph node over the upper outer quadrant.  Six-month follow-up was recommended. Ultrasound in March 2021 revealed multiple hypoechoic lesions of the left axilla with the largest up to 4.6 cm in diameter with another measuring 3.3 cm and another 2.6 cm.  Bilateral diagnostic mammogram at [facility] March revealed at least 7 enlarged intramammary and axillary lymph nodes.  No primary breast lesion was found.  Targeted left breast ultrasound revealed an increase in the previously demonstrated 7 mm intramammary node at 1:30 o'clock measured 13 mm with diffuse cortical thickening.  There were multiple enlarged left axillary nodes with eccentric cortical thickening with a maximum thickness of 19.8 mm.  Ultrasound-guided biopsy of the left axillary lymph node in April revealed metastatic carcinoma consistent with breast primary. Estrogen and progesterone receptors were negative and HER 2 positive.  Ki 67 was 40%.  Staging CT chest, abdomen and pelvis revealed bulky left axillary lymphadenopathy extending to the sub  pectoralis nodal station with a 6 mm right upper paratracheal lymph node.  There was no evidence of metastatic disease in the lungs, abdomen or pelvis.  Breast MRI revealed a 1 x 1.2 oval enhancing mass within the posterior upper outer left breast felt to be an intraparenchymal lymph node, no breast masses were seen.  There was re-demonstration of the left axillary adenopathy.  On physical examination the left axillary adenopathy measured approximately 9 x 7 cm.  She had uterine ablation in 2011 for uterine fibroids, then experienced 1 day of uterine bleeding in 2012, but denies further bleeding.    We recommended neoadjuvant chemotherapy to include TCHP x 6 cycles with trastuzumab  and pertuzumab for one full year.  Unfortunately, echocardiogram  In May revealed ejection fraction between 40 and 45%.  This was confirmed with [name] on May 10th, which revealed an ejection fraction of 42%.  Based on these findings, we changed the treatment regimen to docetaxel/carboplatin/trastuzumab  [facility]) and eliminated pertuzumab, due to the potential cardiotoxicities from HER2 targeted therapies.  We also recommended repeat cardiac imaging every 6 weeks and instead of the standard every 12 weeks.  She was referred to Dr. Revankar and he started her on Entresto  and has been monitoring her cardiac function.  She reported some chest tightness to him, so he recommended a Lexiscan  sestamibi.  She had quit smoking.  She received her 1st cycle of docetaxel, carboplatin and trastuzumab  in May.  She experienced toxicities of decreased appetite, minimal nausea and vomiting, and generalized arthralgias.  Her biggest complaint was generalized achiness and hip pain, felt to be most likely due to [name].  Her pain was not controlled with hydrocodone/APAP 5/325 or 10/325, so she was given oxycodone  5 mg 1-2 tablets every 6 hours as needed for pain.  She was placed on esomeprazole 40 mg daily for acid reflux. She quit smoking in April 2021.    She continued Entresto  daily.  She underwent nuclear stress test at Dr. Posey office in June, which revealed a fairly stable ejection fraction of 39%.  She was also recommended for CT coronary angiography, which is to be scheduled at [facility].  She underwent a coronary/cardiac CT in July revealed moderate CAD in proximal LAD and mild CAD in mid left main coronary artery, CADRADS = 3, coronary calcium  score is 168, which places the patient in the 51 percentile for age and dex matched control, normal coronary origin with right dominance and mildly dilated main pulmonary artery, 30 mm. [name] from August revealed a left ventricular ejection fraction of 39%, previously 42% in May, which was discussed with Dr. Revankar.  We discussed the risks and benefits with the patient again.  Overall, we feel that the benefit outweighs any further risk, as long as she is closely monitored.  Bilateral breast MRI in September revealed a decrease in the left axillary lymphadenopathy.  However, there was a persistent, but smaller circumscribed oval mass in the upper outer quadrant of the left breast.  We discussed this with Dr. Landry Daring of breast radiology and we suspect this could represent the breast primary, but biopsy was negative.  [name] imaging in September revealed a left ventricular fraction is 39%, which was stable.  Ultrasound guided left breast core needle biopsy in September revealed lymphoid tissue with reactive lymphoid hyperplasia and was negative for granulomas or malignancy.  No metastatic breast carcinoma could be seen.  She then underwent left axillary lymph node dissection on October 14th with Dr. Joesph. Surgical pathology from this procedure found tumor present in 3 of 27 lymph nodes (3/27).  One lymph node was positive for macrometastasis measuring 4mm.  Two lymph nodes had micrometastasis.  There were no lymph nodes with isolated tumor cells and no extranodal tumor seen. Echocardiogram in November  revealed a normal ejection fraction between 60-65% , which was a puzzle, as very different from her baseline.  MRI lumbar spine and left hip revealed mild-to-moderate facet arthropathy at L5-S1, which was worse on the right.  MRI left hip was negative.  She completed 1 year of trastuzumab  in June 2022.  Echocardiogram in March revealed an ejection fraction of 45-50%, which although decreased from November, was closer to her baseline. We decided to continue with trastuzumab . Our pharmacist recommended a [name] in 6 weeks instead of echo. [name] scan on April 25th revealed a slight increase in the ejection fraction at 41%. She did complete 1 full year of trastuzumab  and continues to be followed by Cardiology.  Oncology History  Malignant neoplasm of left breast (HCC)  06/01/2019 Cancer Staging   Staging form: Breast, AJCC 8th Edition - Clinical stage from 06/01/2019: cT0, cN1(f), cM0, ER-, PR-, HER2+ - Signed by Cornelius Brandi DEL, MD on 12/31/2019   09/05/2019 Initial Diagnosis   Malignant neoplasm of left breast (HCC)   11/22/2019 Cancer Staging   Staging form: Breast, AJCC 8th Edition - Pathologic stage from 11/22/2019: No Stage Recommended (ypT0, pN1, cM0, ER-, PR-, HER2+) - Signed by Cornelius Brandi DEL, MD on 12/31/2019 Prognostic indicators: 3/27 nodes pos., no primary found   07/16/2020 Imaging   Bilateral diagnostic mammogram:  FINDINGS:  Surgical changes are seen in the left axilla. There are changes of  radiation therapy in the left breast. No suspicious mass or  malignant type microcalcifications identified in either breast.  IMPRESSION:  No evidence of malignancy in either breast.  RECOMMENDATION:  Bilateral diagnostic mammogram in 1 year is recommended.    Malignant neoplasm of left female breast (HCC)  06/22/2019 - 08/01/2020 Chemotherapy   Patient is on Treatment Plan : BREAST  Trastuzumab  Maintenance q21d     11/23/2019 Initial Diagnosis   Malignant neoplasm of left female  breast (HCC)   07/16/2020 Imaging   Bilateral diagnostic mammogram:  FINDINGS:  Surgical changes are seen in the left axilla. There are changes of  radiation therapy in the left breast. No suspicious mass or  malignant type microcalcifications identified in either breast.  IMPRESSION:  No evidence of malignancy in either breast.  RECOMMENDATION:  Bilateral diagnostic mammogram in 1 year is recommended.       INTERVAL HISTORY:  Bren is here for routine follow up for stage IIA HER2 positive breast cancer diagnosed in April, 2021. This was metastatic to axillary nodes but no primary of the breast was ever found. She was treated with docetaxel/carboplatin/trastuzumab  [facility]) and we eliminated pertuzumab, due to the potential cardiotoxicities from HER2 targeted therapies. After she had a axillary node dissection, she was treated with adjuvant radiation to the left breast due to 3 of 27 nodes positive. Patient states that she feels well and has no complaints of pain. She had a bilateral screening mammogram done on 09/02/2023 which was clear. She has a  WBC of 8.6, hemoglobin of 12.8, and platelet count of 308,000. Her CMP is normal other than a elevated creatinine of 1.19 and low normal potassium of 3.5. I advised her to increase potassium in her diet and gave her a list of potassium rich foods. I will see her back in 6 months with CBC and CMP.    She denies fever, chills, night sweats, or other signs of infection. She denies cardiorespiratory and gastrointestinal issues. She  denies pain. Her appetite is great and Her weight has decreased 3 pounds over last 6 months.    REVIEW OF SYSTEMS:  Review of Systems  Constitutional: Negative.  Negative for appetite change, chills, diaphoresis, fatigue, fever and unexpected weight change.  HENT:  Negative.  Negative for hearing loss, lump/mass, mouth sores, nosebleeds, sore throat, tinnitus, trouble swallowing and voice change.   Eyes: Negative.  Negative for  eye problems and icterus.  Respiratory:  Negative for chest tightness, cough, hemoptysis, shortness of breath and wheezing.   Cardiovascular: Negative.  Negative for chest pain, leg swelling and palpitations.  Gastrointestinal: Negative.  Negative for abdominal distention, abdominal pain, blood in stool, constipation, diarrhea, nausea, rectal pain and vomiting.  Endocrine: Negative.   Genitourinary: Negative.  Negative for bladder incontinence, difficulty urinating, dyspareunia, dysuria, frequency, hematuria, menstrual problem, nocturia, pelvic pain, vaginal bleeding and vaginal discharge.   Musculoskeletal: Negative.  Negative for arthralgias, back pain, flank pain, gait problem, myalgias, neck pain and neck stiffness.  Skin: Negative.  Negative for itching, rash and wound.  Neurological:  Negative for dizziness, extremity weakness, gait problem, headaches, light-headedness, numbness, seizures and speech difficulty.  Hematological: Negative.  Negative for adenopathy. Does not bruise/bleed easily.  Psychiatric/Behavioral: Negative.  Negative for confusion, decreased concentration, depression, sleep disturbance and suicidal ideas. The patient is not nervous/anxious.      VITALS:  Blood pressure (!) 119/57, pulse 65, temperature 98.2 F (36.8 C), temperature source Oral, resp. rate 16, height 5' 3 (1.6 m), weight 245 lb 3.2 oz (111.2 kg), SpO2 100%.  Wt Readings from Last 3 Encounters:  09/06/23 245 lb 3.2 oz (111.2 kg)  03/09/23 242 lb 1.6 oz (109.8 kg)  07/29/22 245 lb 4.8 oz (111.3 kg)    Body mass index is 43.44 kg/m.  Performance status (ECOG): 1 - Symptomatic but completely ambulatory  PHYSICAL EXAM:  Physical Exam Vitals and nursing note reviewed.  Constitutional:      General: She is not in acute distress.    Appearance: Normal appearance. She is normal weight. She is not ill-appearing, toxic-appearing or diaphoretic.  HENT:     Head: Normocephalic and atraumatic.     Right  Ear: Tympanic membrane, ear canal and external ear normal. There is no impacted cerumen.     Left Ear: Tympanic membrane, ear canal and external ear normal. There is no impacted cerumen.     Nose: Nose normal. No congestion or rhinorrhea.     Mouth/Throat:     Mouth: Mucous membranes are moist.     Pharynx: Oropharynx is clear. No oropharyngeal exudate or posterior oropharyngeal erythema.  Eyes:     General: No scleral icterus.       Right eye: No discharge.        Left eye: No discharge.     Extraocular Movements: Extraocular movements intact.     Conjunctiva/sclera: Conjunctivae normal.     Pupils: Pupils are equal, round, and reactive to light.  Neck:     Vascular: No carotid bruit.  Cardiovascular:  Rate and Rhythm: Normal rate and regular rhythm.     Pulses: Normal pulses.     Heart sounds: Normal heart sounds. No murmur heard.    No friction rub. No gallop.  Pulmonary:     Effort: Pulmonary effort is normal. No respiratory distress.     Breath sounds: Normal breath sounds. No stridor. No wheezing, rhonchi or rales.  Chest:     Chest wall: No tenderness.  Breasts:    Right: Normal.     Left: Normal.     Comments: Well healed left axillary scar No masses in either breasts Abdominal:     General: Bowel sounds are normal. There is no distension.     Palpations: Abdomen is soft. There is no hepatomegaly, splenomegaly or mass.     Tenderness: There is no abdominal tenderness. There is no right CVA tenderness, left CVA tenderness, guarding or rebound.     Hernia: No hernia is present.  Musculoskeletal:        General: No swelling, tenderness, deformity or signs of injury. Normal range of motion.     Cervical back: Normal range of motion and neck supple. No rigidity or tenderness.     Right lower leg: No edema.     Left lower leg: No edema.  Lymphadenopathy:     Cervical: No cervical adenopathy.     Right cervical: No superficial, deep or posterior cervical adenopathy.     Left cervical: No superficial, deep or posterior cervical adenopathy.     Upper Body:     Right upper body: No supraclavicular, axillary or pectoral adenopathy.     Left upper body: No supraclavicular, axillary or pectoral adenopathy.  Skin:    General: Skin is warm and dry.     Coloration: Skin is not jaundiced or pale.     Findings: No bruising, erythema, lesion or rash.  Neurological:     General: No focal deficit present.     Mental Status: She is alert and oriented to person, place, and time. Mental status is at baseline.     Cranial Nerves: No cranial nerve deficit.     Sensory: No sensory deficit.     Motor: No weakness.     Coordination: Coordination normal.     Gait: Gait normal.     Deep Tendon Reflexes: Reflexes normal.  Psychiatric:        Mood and Affect: Mood normal.        Behavior: Behavior normal.        Thought Content: Thought content normal.        Judgment: Judgment normal.    LABS:      Latest Ref Rng & Units 09/06/2023    1:22 PM 02/17/2023    9:46 AM 07/29/2022    9:07 AM  CBC  WBC 4.0 - 10.5 K/uL 8.6  9.6  8.9   Hemoglobin 12.0 - 15.0 g/dL 87.1  86.3  86.9   Hematocrit 36.0 - 46.0 % 39.2  41.1  40.4   Platelets 150 - 400 K/uL 308  328  313       Latest Ref Rng & Units 09/06/2023    1:22 PM 02/17/2023    9:46 AM 07/29/2022    9:07 AM  CMP  Glucose 70 - 99 mg/dL 868  899  880   BUN 6 - 20 mg/dL 12  12  14    Creatinine 0.44 - 1.00 mg/dL 8.80  8.83  8.94   Sodium 135 - 145  mmol/L 141  138  139   Potassium 3.5 - 5.1 mmol/L 3.5  4.2  3.8   Chloride 98 - 111 mmol/L 106  103  109   CO2 22 - 32 mmol/L 22  24  21    Calcium  8.9 - 10.3 mg/dL 9.7  89.8  9.3   Total Protein 6.5 - 8.1 g/dL 7.3  7.8  7.6   Total Bilirubin 0.0 - 1.2 mg/dL 0.6  0.5  0.9   Alkaline Phos 38 - 126 U/L 85  94  68   AST 15 - 41 U/L 23  23  30    ALT 0 - 44 U/L 18  23  25     Lab Results  Component Value Date   TSH 0.208 (L) 12/07/2019   STUDIES:  Exam: 09/02/2023 Digital  Screening Bilateral Mammogram Impression: No mammographic evidence of malignancy.    HISTORY:   Allergies:  Allergies  Allergen Reactions   Sulfa Antibiotics Hives    Current Medications: Current Outpatient Medications  Medication Sig Dispense Refill   acetaminophen  (TYLENOL ) 500 MG tablet Take 500 mg by mouth every 6 (six) hours as needed for moderate pain or mild pain (pain.).     Artificial Tear Solution (GENTEAL TEARS) 0.1-0.2-0.3 % SOLN Place 1 drop into both eyes at bedtime.     aspirin  EC 81 MG tablet Take 81 mg by mouth daily. Swallow whole.     atorvastatin  (LIPITOR) 20 MG tablet Take 1 tablet (20 mg total) by mouth daily. Patient needs appointment for further refills. 1 st attempt 30 tablet 0   benzonatate (TESSALON) 200 MG capsule Take 200 mg by mouth 3 (three) times daily.     cetirizine (ZYRTEC) 10 MG tablet Take 10 mg by mouth daily as needed for allergies.     clonazePAM (KLONOPIN) 1 MG tablet Take 1 mg by mouth 2 (two) times daily.     cyanocobalamin (,VITAMIN B-12,) 1000 MCG/ML injection Inject 1,000 mcg into the muscle every 30 (thirty) days.     doxycycline (VIBRAMYCIN) 100 MG capsule Take 100 mg by mouth 2 (two) times daily.     DULoxetine (CYMBALTA) 30 MG capsule Take 30 mg by mouth in the morning.     famotidine (PEPCID) 40 MG tablet Take 40 mg by mouth in the morning.     FARXIGA 5 MG TABS tablet Take 5 mg by mouth daily.     folic acid (FOLVITE) 1 MG tablet Take 1 mg by mouth in the morning.     gabapentin (NEURONTIN) 600 MG tablet Take 600 mg by mouth 2 (two) times daily.     hydrOXYzine (ATARAX/VISTARIL) 25 MG tablet Take 25 mg by mouth 4 (four) times daily as needed for itching or rash.     levothyroxine (SYNTHROID) 112 MCG tablet Take 112 mcg by mouth daily.     loratadine (CLARITIN) 10 MG tablet Take 10 mg by mouth daily as needed for allergies.     methylPREDNISolone  (MEDROL  DOSEPAK) 4 MG TBPK tablet As directed 21 tablet 0   nitroGLYCERIN  (NITROSTAT )  0.4 MG SL tablet Place 0.4 mg under the tongue every 5 (five) minutes as needed for chest pain.     ondansetron  (ZOFRAN ) 4 MG tablet Take 4 mg by mouth every 4 (four) hours as needed for nausea/vomiting.     Oxycodone  HCl 10 MG TABS Take 10 mg by mouth in the morning, at noon, in the evening, and at bedtime.     pravastatin (PRAVACHOL) 20 MG  tablet Take 20 mg by mouth daily.     sacubitril -valsartan  (ENTRESTO ) 24-26 MG Take 1 tablet by mouth 2 (two) times daily. 60 tablet 12   traZODone (DESYREL) 50 MG tablet Take 50 mg by mouth at bedtime.     venlafaxine XR (EFFEXOR-XR) 150 MG 24 hr capsule Take 150 mg by mouth in the morning.     Vitamin D, Ergocalciferol, (DRISDOL) 1.25 MG (50000 UNIT) CAPS capsule Take 50,000 Units by mouth every Monday.     No current facility-administered medications for this visit.   Facility-Administered Medications Ordered in Other Visits  Medication Dose Route Frequency Provider Last Rate Last Admin   sodium chloride  flush (NS) 0.9 % injection 10 mL  10 mL Intracatheter PRN Mosher, Kelli A, PA-C   10 mL at 11/18/20 1123    I,Jasmine M Lassiter,acting as a Neurosurgeon for Brandi VEAR Cornish, MD.,have documented all relevant documentation on the behalf of Brandi VEAR Cornish, MD,as directed by  Brandi VEAR Cornish, MD while in the presence of Brandi VEAR Cornish, MD.

## 2023-09-06 NOTE — Telephone Encounter (Signed)
 Patient has been scheduled for follow-up visit per 09/06/23 LOS.  Pt given an appt calendar with date and time.

## 2023-09-18 ENCOUNTER — Encounter: Payer: Self-pay | Admitting: Hematology and Oncology

## 2024-03-08 ENCOUNTER — Inpatient Hospital Stay: Attending: Oncology

## 2024-03-08 ENCOUNTER — Other Ambulatory Visit: Payer: Self-pay | Admitting: Oncology

## 2024-03-08 ENCOUNTER — Inpatient Hospital Stay (HOSPITAL_BASED_OUTPATIENT_CLINIC_OR_DEPARTMENT_OTHER): Admitting: Oncology

## 2024-03-08 VITALS — BP 132/58 | HR 76 | Temp 98.1°F | Resp 18 | Ht 63.0 in | Wt 251.0 lb

## 2024-03-08 DIAGNOSIS — N189 Chronic kidney disease, unspecified: Secondary | ICD-10-CM | POA: Diagnosis not present

## 2024-03-08 DIAGNOSIS — C50112 Malignant neoplasm of central portion of left female breast: Secondary | ICD-10-CM

## 2024-03-08 DIAGNOSIS — M549 Dorsalgia, unspecified: Secondary | ICD-10-CM | POA: Insufficient documentation

## 2024-03-08 DIAGNOSIS — Z853 Personal history of malignant neoplasm of breast: Secondary | ICD-10-CM | POA: Diagnosis present

## 2024-03-08 DIAGNOSIS — G8929 Other chronic pain: Secondary | ICD-10-CM | POA: Diagnosis not present

## 2024-03-08 DIAGNOSIS — Z9221 Personal history of antineoplastic chemotherapy: Secondary | ICD-10-CM | POA: Diagnosis not present

## 2024-03-08 DIAGNOSIS — R7989 Other specified abnormal findings of blood chemistry: Secondary | ICD-10-CM | POA: Diagnosis not present

## 2024-03-08 DIAGNOSIS — I429 Cardiomyopathy, unspecified: Secondary | ICD-10-CM | POA: Diagnosis not present

## 2024-03-08 DIAGNOSIS — Z171 Estrogen receptor negative status [ER-]: Secondary | ICD-10-CM

## 2024-03-08 DIAGNOSIS — Z923 Personal history of irradiation: Secondary | ICD-10-CM | POA: Insufficient documentation

## 2024-03-08 LAB — CBC WITH DIFFERENTIAL (CANCER CENTER ONLY)
Abs Immature Granulocytes: 0.02 10*3/uL (ref 0.00–0.07)
Basophils Absolute: 0 10*3/uL (ref 0.0–0.1)
Basophils Relative: 0 %
Eosinophils Absolute: 0.2 10*3/uL (ref 0.0–0.5)
Eosinophils Relative: 2 %
HCT: 41.1 % (ref 36.0–46.0)
Hemoglobin: 13.5 g/dL (ref 12.0–15.0)
Immature Granulocytes: 0 %
Lymphocytes Relative: 29 %
Lymphs Abs: 2.9 10*3/uL (ref 0.7–4.0)
MCH: 28.2 pg (ref 26.0–34.0)
MCHC: 32.8 g/dL (ref 30.0–36.0)
MCV: 85.8 fL (ref 80.0–100.0)
Monocytes Absolute: 0.5 10*3/uL (ref 0.1–1.0)
Monocytes Relative: 5 %
Neutro Abs: 6.5 10*3/uL (ref 1.7–7.7)
Neutrophils Relative %: 64 %
Platelet Count: 311 10*3/uL (ref 150–400)
RBC: 4.79 MIL/uL (ref 3.87–5.11)
RDW: 12.6 % (ref 11.5–15.5)
WBC Count: 10.1 10*3/uL (ref 4.0–10.5)
nRBC: 0 % (ref 0.0–0.2)

## 2024-03-08 LAB — CMP (CANCER CENTER ONLY)
ALT: 18 U/L (ref 0–44)
AST: 25 U/L (ref 15–41)
Albumin: 4.1 g/dL (ref 3.5–5.0)
Alkaline Phosphatase: 84 U/L (ref 38–126)
Anion gap: 11 (ref 5–15)
BUN: 15 mg/dL (ref 6–20)
CO2: 27 mmol/L (ref 22–32)
Calcium: 9.8 mg/dL (ref 8.9–10.3)
Chloride: 99 mmol/L (ref 98–111)
Creatinine: 1.16 mg/dL — ABNORMAL HIGH (ref 0.44–1.00)
GFR, Estimated: 54 mL/min — ABNORMAL LOW
Glucose, Bld: 110 mg/dL — ABNORMAL HIGH (ref 70–99)
Potassium: 3.6 mmol/L (ref 3.5–5.1)
Sodium: 137 mmol/L (ref 135–145)
Total Bilirubin: 0.8 mg/dL (ref 0.0–1.2)
Total Protein: 7.7 g/dL (ref 6.5–8.1)

## 2024-03-08 NOTE — Progress Notes (Signed)
 " [facility]  [address] [place],  [place]  [address] [phone]  Clinic Day: 03/08/24   Referring physician: No ref. provider found  ASSESSMENT & PLAN:  Assessment: 1. 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  No primary breast lesion was found.  She was treated with neoadjuvant docetaxel/carboplatin/trastuzumab , left axillary lymph node dissection and adjuvant radiation therapy to the left breast and axilla. She completed 1 year of maintenance trastuzumab  in June 2022. She remains without evidence of malignancy.   2. Cardiomyopathy with decreased left ventricular ejection fraction.  This is being managed by Dr. Edwyna.  [name] from September revealed an ejection fraction of 39%, which was stable. Echocardiogram in November revealed an ejection fraction between 60-65%, which was a puzzle, as it has never been normal. Echocardiogram in March revealed an ejection fraction of 45-50%, which is closer to her baseline. She remains without symptoms of heart failure. [name] from April 2022 revealed stable left ventricular function, with an ejection fraction of 41%.   3. Bilateral hip and back pain, which is chronic.  MRI lumbar spine and left hip did not reveal any evidence of malignancy.  There were degenerative changes in the lumbar spine at L5-S1 several years ago. I advised her to avoid NSAID's due to her elevated creatinine.   4. Chronic Kidney Disease. Her creatinine is 1.16 today but that is actually improved from last time.   Plan: Patient complains of lower back pain rating 6/10. We know she has degenerative changes of the lumbar spine and this is a chronic problem. She will be due for repeat screening mammogram in July which will be scheduled by Dr. Joesph. She has a WBC of 10.1, hemoglobin of 13.5, and platelet count of 311,000. Her CMP is normal other than an elevated creatinine of 1.16. She has not had a bone  density scan and I recommended she have a baseline scan done. I will schedule this for her and see her back in 1 year with CBC and CMP. The patient understands the plans discussed today and is in agreement with them.  She knows to contact our office if she develops concerns prior to her next appointment.  I provided 13 minutes of face-to-face time during this this encounter and > 50% was spent counseling as documented under my assessment and plan.   [name], MD  [facility] A DEPT OF [facility] [address] [place] [address] Dept: [phone] Dept Fax: [phone]   No orders of the defined types were placed in this encounter.  CHIEF COMPLAINT:  CC: Stage IIA HER2 positive breast cancer  Current Treatment:  Observation  HISTORY OF PRESENT ILLNESS:  Stage IIA (T0 N1 M0) HER2 receptor positive left breast cancer diagnosed in April 2021.  The patient felt a mass in the left axilla in March.  Her last bilateral screening mammogram had been in November 2019 with a diagnostic left mammogram and ultrasound in December which revealed probable inframammary lymph node over the upper outer quadrant.  Six-month follow-up was recommended. Ultrasound in March 2021 revealed multiple hypoechoic lesions of the left axilla with the largest up to 4.6 cm in diameter with another measuring 3.3 cm and another 2.6 cm.  Bilateral diagnostic mammogram at [facility] March revealed at least 7 enlarged intramammary and axillary lymph nodes.  No primary breast lesion was found.  Targeted left breast ultrasound revealed an increase in the previously demonstrated 7 mm intramammary node at 1:30 o'clock measured 13 mm with diffuse cortical thickening.  There were multiple enlarged left axillary nodes with eccentric cortical thickening with a maximum thickness of 19.8 mm.  Ultrasound-guided biopsy of the left axillary lymph node in April revealed  metastatic carcinoma consistent with breast primary. Estrogen and progesterone receptors were negative and HER 2 positive.  Ki 67 was 40%.  Staging CT chest, abdomen and pelvis revealed bulky left axillary lymphadenopathy extending to the sub pectoralis nodal station with a 6 mm right upper paratracheal lymph node.  There was no evidence of metastatic disease in the lungs, abdomen or pelvis.  Breast MRI revealed a 1 x 1.2 oval enhancing mass within the posterior upper outer left breast felt to be an intraparenchymal lymph node, no breast masses were seen.  There was re-demonstration of the left axillary adenopathy.  On physical examination the left axillary adenopathy measured approximately 9 x 7 cm.  She had uterine ablation in 2011 for uterine fibroids, then experienced 1 day of uterine bleeding in 2012, but denies further bleeding.    We recommended neoadjuvant chemotherapy to include TCHP x 6 cycles with trastuzumab  and pertuzumab for one full year.  Unfortunately, echocardiogram  In May revealed ejection fraction between 40 and 45%.  This was confirmed with [name] on May 10th, which revealed an ejection fraction of 42%.  Based on these findings, we changed the treatment regimen to docetaxel/carboplatin/trastuzumab  [facility]) and eliminated pertuzumab, due to the potential cardiotoxicities from HER2 targeted therapies.  We also recommended repeat cardiac imaging every 6 weeks and instead of the standard every 12 weeks.  She was referred to Dr. Revankar and he started her on Entresto  and has been monitoring her cardiac function.  She reported some chest tightness to him, so he recommended a Lexiscan  sestamibi.  She had quit smoking.  She received her 1st cycle of docetaxel, carboplatin and trastuzumab  in May.  She experienced toxicities of decreased appetite, minimal nausea and vomiting, and generalized arthralgias.  Her biggest complaint was generalized achiness and hip pain, felt to be most likely due to [name].   Her pain was not controlled with hydrocodone/APAP 5/325 or 10/325, so she was given oxycodone  5 mg 1-2 tablets every 6 hours as needed for pain.  She was placed on esomeprazole 40 mg daily for acid reflux. She quit smoking in April 2021.   She continued Entresto  daily.  She underwent nuclear stress test at Dr. Posey office in June, which revealed a fairly stable ejection fraction of 39%.  She was also recommended for CT coronary angiography, which is to be scheduled at [facility].  She underwent a coronary/cardiac CT in July revealed moderate CAD in proximal LAD and mild CAD in mid left main coronary artery, CADRADS = 3, coronary calcium  score is 168, which places the patient in the 38 percentile for age and dex matched control, normal coronary origin with right dominance and mildly dilated main pulmonary artery, 30 mm. [name] from August revealed a left ventricular ejection fraction of 39%, previously 42% in May, which was discussed with Dr. Revankar.  We discussed the risks and benefits with the patient again.  Overall, we feel that the benefit outweighs any further risk, as long as she is closely monitored.  Bilateral breast MRI in September revealed a decrease in the left axillary lymphadenopathy.  However, there was a persistent, but smaller circumscribed oval mass in the upper  outer quadrant of the left breast.  We discussed this with Dr. Landry Daring of breast radiology and we suspect this could represent the breast primary, but biopsy was negative.  [name] imaging in September revealed a left ventricular fraction is 39%, which was stable.    Ultrasound guided left breast core needle biopsy in September revealed lymphoid tissue with reactive lymphoid hyperplasia and was negative for granulomas or malignancy.  No metastatic breast carcinoma could be seen.  She then underwent left axillary lymph node dissection on October 14th with Dr. Joesph. Surgical pathology from this procedure found tumor present in 3  of 27 lymph nodes (3/27).  One lymph node was positive for macrometastasis measuring 4mm.  Two lymph nodes had micrometastasis.  There were no lymph nodes with isolated tumor cells and no extranodal tumor seen. Echocardiogram in November revealed a normal ejection fraction between 60-65% , which was a puzzle, as very different from her baseline.  MRI lumbar spine and left hip revealed mild-to-moderate facet arthropathy at L5-S1, which was worse on the right.  MRI left hip was negative.  She completed 1 year of trastuzumab  in June 2022.  Echocardiogram in March revealed an ejection fraction of 45-50%, which although decreased from November, was closer to her baseline. We decided to continue with trastuzumab . Our pharmacist recommended a [name] in 6 weeks instead of echo. [name] scan on April 25th revealed a slight increase in the ejection fraction at 41%. She did complete 1 full year of trastuzumab  and continues to be followed by Cardiology.  Oncology History  Malignant neoplasm of left breast (HCC)  06/01/2019 Cancer Staging   Staging form: Breast, AJCC 8th Edition - Clinical stage from 06/01/2019: cT0, cN1(f), cM0, ER-, PR-, HER2+ - Signed by Cornelius Wanda DEL, MD on 12/31/2019   09/05/2019 Initial Diagnosis   Malignant neoplasm of left breast (HCC)   11/22/2019 Cancer Staging   Staging form: Breast, AJCC 8th Edition - Pathologic stage from 11/22/2019: No Stage Recommended (ypT0, pN1, cM0, ER-, PR-, HER2+) - Signed by Cornelius Wanda DEL, MD on 12/31/2019 Prognostic indicators: 3/27 nodes pos., no primary found   07/16/2020 Imaging   Bilateral diagnostic mammogram:  FINDINGS:  Surgical changes are seen in the left axilla. There are changes of  radiation therapy in the left breast. No suspicious mass or  malignant type microcalcifications identified in either breast.  IMPRESSION:  No evidence of malignancy in either breast.  RECOMMENDATION:  Bilateral diagnostic mammogram in 1 year is  recommended.    Malignant neoplasm of left female breast (HCC)  06/22/2019 - 08/01/2020 Chemotherapy   Patient is on Treatment Plan : BREAST  Trastuzumab  Maintenance q21d     11/23/2019 Initial Diagnosis   Malignant neoplasm of left female breast (HCC)   07/16/2020 Imaging   Bilateral diagnostic mammogram:  FINDINGS:  Surgical changes are seen in the left axilla. There are changes of  radiation therapy in the left breast. No suspicious mass or  malignant type microcalcifications identified in either breast.  IMPRESSION:  No evidence of malignancy in either breast.  RECOMMENDATION:  Bilateral diagnostic mammogram in 1 year is recommended.       INTERVAL HISTORY:  Linet is here for routine follow up for stage IIA HER2 positive breast cancer diagnosed in April, 2021. This was metastatic to axillary nodes but no primary of the breast was ever found. She was treated with docetaxel/carboplatin/trastuzumab  [facility]) and we eliminated pertuzumab, due to the potential cardiotoxicities from HER2 targeted therapies. After she had  a axillary node dissection, she was treated with adjuvant radiation to the left breast due to 3 of 27 nodes positive. Patient states that she feels well but complains of lower back pain rating 6/10. We know she has degenerative changes of the lumbar spine and this is a chronic problem. She will be due for repeat screening mammogram in July which will be scheduled by Dr. Joesph. She has a WBC of 10.1, hemoglobin of 13.5, and platelet count of 311,000. Her CMP is normal other than an elevated creatinine of 1.16.  She has not had a bone density scan and I recommended she have a baseline scan done. I will schedule this for her and see her back in 1 year with CBC and CMP.    She denies fever, chills, night sweats, or other signs of infection. She denies cardiorespiratory and gastrointestinal issues. Her appetite is good and Her weight has increased 6 pounds over last 6 months.      REVIEW OF SYSTEMS:  Review of Systems  Constitutional: Negative.  Negative for appetite change, chills, diaphoresis, fatigue, fever and unexpected weight change.  HENT:  Negative.  Negative for hearing loss, lump/mass, mouth sores, nosebleeds, sore throat, tinnitus, trouble swallowing and voice change.   Eyes: Negative.  Negative for eye problems and icterus.  Respiratory:  Negative for chest tightness, cough, hemoptysis, shortness of breath and wheezing.   Cardiovascular: Negative.  Negative for chest pain, leg swelling and palpitations.  Gastrointestinal: Negative.  Negative for abdominal distention, abdominal pain, blood in stool, constipation, diarrhea, nausea, rectal pain and vomiting.  Endocrine: Negative.   Genitourinary: Negative.  Negative for bladder incontinence, difficulty urinating, dyspareunia, dysuria, frequency, hematuria, menstrual problem, nocturia, pelvic pain, vaginal bleeding and vaginal discharge.   Musculoskeletal:  Positive for back pain (lower, 6/10). Negative for arthralgias, flank pain, gait problem, myalgias, neck pain and neck stiffness.  Skin: Negative.  Negative for itching, rash and wound.  Neurological:  Negative for dizziness, extremity weakness, gait problem, headaches, light-headedness, numbness, seizures and speech difficulty.  Hematological: Negative.  Negative for adenopathy. Does not bruise/bleed easily.  Psychiatric/Behavioral: Negative.  Negative for confusion, decreased concentration, depression, sleep disturbance and suicidal ideas. The patient is not nervous/anxious.      VITALS:  Blood pressure (!) 132/58, pulse 76, temperature 98.1 F (36.7 C), temperature source Oral, resp. rate 18, height 5' 3 (1.6 m), weight 251 lb (113.9 kg), SpO2 99%.  Wt Readings from Last 3 Encounters:  03/08/24 251 lb (113.9 kg)  09/06/23 245 lb 3.2 oz (111.2 kg)  03/09/23 242 lb 1.6 oz (109.8 kg)    Body mass index is 44.46 kg/m.  Performance status (ECOG): 1 -  Symptomatic but completely ambulatory  PHYSICAL EXAM:  Physical Exam Vitals and nursing note reviewed.  Constitutional:      General: She is not in acute distress.    Appearance: Normal appearance. She is normal weight. She is not ill-appearing, toxic-appearing or diaphoretic.  HENT:     Head: Normocephalic and atraumatic.     Right Ear: Tympanic membrane, ear canal and external ear normal. There is no impacted cerumen.     Left Ear: Tympanic membrane, ear canal and external ear normal. There is no impacted cerumen.     Nose: Nose normal. No congestion or rhinorrhea.     Mouth/Throat:     Mouth: Mucous membranes are moist.     Pharynx: Oropharynx is clear. No oropharyngeal exudate or posterior oropharyngeal erythema.  Eyes:  General: No scleral icterus.       Right eye: No discharge.        Left eye: No discharge.     Extraocular Movements: Extraocular movements intact.     Conjunctiva/sclera: Conjunctivae normal.     Pupils: Pupils are equal, round, and reactive to light.  Neck:     Vascular: No carotid bruit.  Cardiovascular:     Rate and Rhythm: Normal rate and regular rhythm.     Pulses: Normal pulses.     Heart sounds: Normal heart sounds. No murmur heard.    No friction rub. No gallop.  Pulmonary:     Effort: Pulmonary effort is normal. No respiratory distress.     Breath sounds: Normal breath sounds. No stridor. No wheezing, rhonchi or rales.  Chest:     Chest wall: No tenderness.  Breasts:    Right: Normal.     Left: Normal.     Comments: Well healed scar in the right upper chest in the site of her prior port.  Well healed left axillary scar No masses in either breasts Abdominal:     General: Bowel sounds are normal. There is no distension.     Palpations: Abdomen is soft. There is no hepatomegaly, splenomegaly or mass.     Tenderness: There is no abdominal tenderness. There is no right CVA tenderness, left CVA tenderness, guarding or rebound.     Hernia: No  hernia is present.  Musculoskeletal:        General: No swelling, tenderness, deformity or signs of injury. Normal range of motion.     Cervical back: Normal range of motion and neck supple. No rigidity or tenderness.     Right lower leg: No edema.     Left lower leg: No edema.  Lymphadenopathy:     Cervical: No cervical adenopathy.     Right cervical: No superficial, deep or posterior cervical adenopathy.    Left cervical: No superficial, deep or posterior cervical adenopathy.     Upper Body:     Right upper body: No supraclavicular, axillary or pectoral adenopathy.     Left upper body: No supraclavicular, axillary or pectoral adenopathy.  Skin:    General: Skin is warm and dry.     Coloration: Skin is not jaundiced or pale.     Findings: No bruising, erythema, lesion or rash.  Neurological:     General: No focal deficit present.     Mental Status: She is alert and oriented to person, place, and time. Mental status is at baseline.     Cranial Nerves: No cranial nerve deficit.     Sensory: No sensory deficit.     Motor: No weakness.     Coordination: Coordination normal.     Gait: Gait normal.     Deep Tendon Reflexes: Reflexes normal.  Psychiatric:        Mood and Affect: Mood normal.        Behavior: Behavior normal.        Thought Content: Thought content normal.        Judgment: Judgment normal.    LABS:      Latest Ref Rng & Units 03/08/2024   11:10 AM 09/06/2023    1:22 PM 02/17/2023    9:46 AM  CBC  WBC 4.0 - 10.5 K/uL 10.1  8.6  9.6   Hemoglobin 12.0 - 15.0 g/dL 86.4  87.1  86.3   Hematocrit 36.0 - 46.0 % 41.1  39.2  41.1   Platelets 150 - 400 K/uL 311  308  328       Latest Ref Rng & Units 03/08/2024   11:10 AM 09/06/2023    1:22 PM 02/17/2023    9:46 AM  CMP  Glucose 70 - 99 mg/dL 889  868  899   BUN 6 - 20 mg/dL 15  12  12    Creatinine 0.44 - 1.00 mg/dL 8.83  8.80  8.83   Sodium 135 - 145 mmol/L 137  141  138   Potassium 3.5 - 5.1 mmol/L 3.6  3.5  4.2    Chloride 98 - 111 mmol/L 99  106  103   CO2 22 - 32 mmol/L 27  22  24    Calcium  8.9 - 10.3 mg/dL 9.8  9.7  89.8   Total Protein 6.5 - 8.1 g/dL 7.7  7.3  7.8   Total Bilirubin 0.0 - 1.2 mg/dL 0.8  0.6  0.5   Alkaline Phos 38 - 126 U/L 84  85  94   AST 15 - 41 U/L 25  23  23    ALT 0 - 44 U/L 18  18  23     Lab Results  Component Value Date   TSH 0.208 (L) 12/07/2019   STUDIES:  Exam: 09/02/2023 Digital Screening Bilateral Mammogram Impression: No mammographic evidence of malignancy.    HISTORY:   Allergies:  Allergies  Allergen Reactions   Sulfa Antibiotics Hives    Current Medications: Current Outpatient Medications  Medication Sig Dispense Refill   furosemide (LASIX) 20 MG tablet Take 20 mg by mouth daily.     rosuvastatin (CRESTOR) 10 MG tablet Take 10 mg by mouth daily.     acetaminophen  (TYLENOL ) 500 MG tablet Take 500 mg by mouth every 6 (six) hours as needed for moderate pain or mild pain (pain.).     Artificial Tear Solution (GENTEAL TEARS) 0.1-0.2-0.3 % SOLN Place 1 drop into both eyes at bedtime.     aspirin  EC 81 MG tablet Take 81 mg by mouth daily. Swallow whole.     benzonatate (TESSALON) 200 MG capsule Take 200 mg by mouth 3 (three) times daily.     cetirizine (ZYRTEC) 10 MG tablet Take 10 mg by mouth daily as needed for allergies.     clonazePAM (KLONOPIN) 1 MG tablet Take 1 mg by mouth 2 (two) times daily.     cyanocobalamin (,VITAMIN B-12,) 1000 MCG/ML injection Inject 1,000 mcg into the muscle every 30 (thirty) days.     DULoxetine (CYMBALTA) 30 MG capsule Take 30 mg by mouth in the morning.     famotidine (PEPCID) 40 MG tablet Take 40 mg by mouth in the morning.     FARXIGA 5 MG TABS tablet Take 5 mg by mouth daily.     folic acid (FOLVITE) 1 MG tablet Take 1 mg by mouth in the morning.     gabapentin (NEURONTIN) 600 MG tablet Take 600 mg by mouth 2 (two) times daily.     hydrOXYzine (ATARAX/VISTARIL) 25 MG tablet Take 25 mg by mouth 4 (four) times daily  as needed for itching or rash.     levothyroxine (SYNTHROID) 112 MCG tablet Take 112 mcg by mouth daily.     loratadine (CLARITIN) 10 MG tablet Take 10 mg by mouth daily as needed for allergies.     nitroGLYCERIN  (NITROSTAT ) 0.4 MG SL tablet Place 0.4 mg under the tongue every 5 (five) minutes as needed for chest pain.  ondansetron  (ZOFRAN ) 4 MG tablet Take 4 mg by mouth every 4 (four) hours as needed for nausea/vomiting.     Oxycodone  HCl 10 MG TABS Take 10 mg by mouth in the morning, at noon, in the evening, and at bedtime.     pravastatin (PRAVACHOL) 20 MG tablet Take 20 mg by mouth daily.     sacubitril -valsartan  (ENTRESTO ) 24-26 MG Take 1 tablet by mouth 2 (two) times daily. 60 tablet 12   traZODone (DESYREL) 50 MG tablet Take 50 mg by mouth at bedtime.     venlafaxine XR (EFFEXOR-XR) 150 MG 24 hr capsule Take 150 mg by mouth in the morning.     Vitamin D, Ergocalciferol, (DRISDOL) 1.25 MG (50000 UNIT) CAPS capsule Take 50,000 Units by mouth every Monday.     No current facility-administered medications for this visit.   Facility-Administered Medications Ordered in Other Visits  Medication Dose Route Frequency Provider Last Rate Last Admin   sodium chloride  flush (NS) 0.9 % injection 10 mL  10 mL Intracatheter PRN Mosher, Kelli A, PA-C   10 mL at 11/18/20 1123    I,Jasmine M Lassiter,acting as a neurosurgeon for [name], MD.,have documented all relevant documentation on the behalf of [name], MD,as directed by  [name], MD while in the presence of [name], MD.  I have reviewed this report as typed by the medical scribe, and it is complete and accurate. "

## 2024-03-13 ENCOUNTER — Encounter: Payer: Self-pay | Admitting: Oncology

## 2024-03-13 ENCOUNTER — Encounter: Payer: Self-pay | Admitting: Hematology and Oncology

## 2024-03-22 ENCOUNTER — Other Ambulatory Visit (HOSPITAL_BASED_OUTPATIENT_CLINIC_OR_DEPARTMENT_OTHER): Admitting: Radiology

## 2025-03-08 ENCOUNTER — Inpatient Hospital Stay: Admitting: Oncology

## 2025-03-08 ENCOUNTER — Inpatient Hospital Stay
